# Patient Record
Sex: Male | Born: 1950 | Race: Black or African American | Hispanic: No | Smoking: Never smoker
Health system: Southern US, Community
[De-identification: ages and names within clinical notes are randomized; demographics above are authoritative.]

## PROBLEM LIST (undated history)

## (undated) DIAGNOSIS — M1990 Unspecified osteoarthritis, unspecified site: Secondary | ICD-10-CM

## (undated) DIAGNOSIS — E7800 Pure hypercholesterolemia, unspecified: Secondary | ICD-10-CM

## (undated) DIAGNOSIS — Z9989 Dependence on other enabling machines and devices: Secondary | ICD-10-CM

## (undated) DIAGNOSIS — I219 Acute myocardial infarction, unspecified: Secondary | ICD-10-CM

## (undated) DIAGNOSIS — Z9289 Personal history of other medical treatment: Secondary | ICD-10-CM

## (undated) DIAGNOSIS — G8929 Other chronic pain: Secondary | ICD-10-CM

## (undated) DIAGNOSIS — E039 Hypothyroidism, unspecified: Secondary | ICD-10-CM

## (undated) DIAGNOSIS — I4892 Unspecified atrial flutter: Secondary | ICD-10-CM

## (undated) DIAGNOSIS — N183 Chronic kidney disease, stage 3 unspecified: Secondary | ICD-10-CM

## (undated) DIAGNOSIS — R55 Syncope and collapse: Secondary | ICD-10-CM

## (undated) DIAGNOSIS — M545 Low back pain, unspecified: Secondary | ICD-10-CM

## (undated) DIAGNOSIS — K922 Gastrointestinal hemorrhage, unspecified: Secondary | ICD-10-CM

## (undated) DIAGNOSIS — Z95810 Presence of automatic (implantable) cardiac defibrillator: Secondary | ICD-10-CM

## (undated) DIAGNOSIS — Z950 Presence of cardiac pacemaker: Secondary | ICD-10-CM

## (undated) DIAGNOSIS — I2510 Atherosclerotic heart disease of native coronary artery without angina pectoris: Secondary | ICD-10-CM

## (undated) DIAGNOSIS — D638 Anemia in other chronic diseases classified elsewhere: Secondary | ICD-10-CM

## (undated) DIAGNOSIS — G4733 Obstructive sleep apnea (adult) (pediatric): Secondary | ICD-10-CM

## (undated) DIAGNOSIS — T4145XA Adverse effect of unspecified anesthetic, initial encounter: Secondary | ICD-10-CM

## (undated) DIAGNOSIS — E119 Type 2 diabetes mellitus without complications: Secondary | ICD-10-CM

## (undated) DIAGNOSIS — T8859XA Other complications of anesthesia, initial encounter: Secondary | ICD-10-CM

## (undated) DIAGNOSIS — B1920 Unspecified viral hepatitis C without hepatic coma: Secondary | ICD-10-CM

## (undated) HISTORY — DX: Anemia in other chronic diseases classified elsewhere: D63.8

## (undated) HISTORY — PX: LAPAROSCOPIC CHOLECYSTECTOMY: SUR755

## (undated) HISTORY — PX: ATRIAL FLUTTER ABLATION: SHX5733

## (undated) HISTORY — PX: CARDIAC DEFIBRILLATOR PLACEMENT: SHX171

## (undated) HISTORY — PX: CORONARY ANGIOPLASTY WITH STENT PLACEMENT: SHX49

## (undated) HISTORY — PX: CARDIOVERSION: SHX1299

## (undated) HISTORY — PX: ANKLE FRACTURE SURGERY: SHX122

## (undated) HISTORY — PX: FRACTURE SURGERY: SHX138

---

## 2006-06-10 HISTORY — PX: CARDIAC CATHETERIZATION: SHX172

## 2006-07-09 ENCOUNTER — Inpatient Hospital Stay (HOSPITAL_COMMUNITY): Admission: EM | Admit: 2006-07-09 | Discharge: 2006-07-18 | Payer: Self-pay | Admitting: Emergency Medicine

## 2006-07-09 ENCOUNTER — Ambulatory Visit: Payer: Self-pay | Admitting: Internal Medicine

## 2007-06-26 IMAGING — US US ABDOMEN COMPLETE
1 series · 14 of 25 positions shown · non-contrast
Comparison: No comparison films available.

CLINICAL DATA: Abdominal pain.  
 ABDOMEN ULTRASOUND:
TECHNIQUE: Complete abdominal ultrasound examination was performed including evaluation of the liver, gallbladder, bile ducts, pancreas, kidneys, spleen, IVC, and abdominal aorta.

[Series 1: unknown · 0.35mm/px · 14 of 88 slices shown]
[im 1/88]
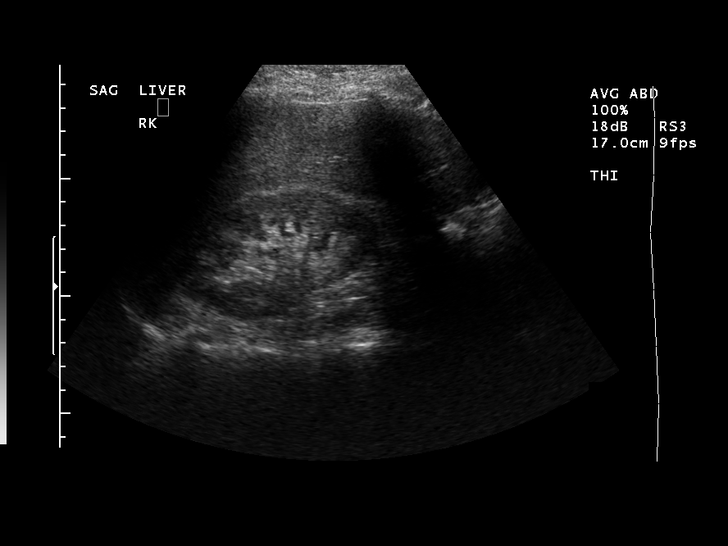
[im 8/88]
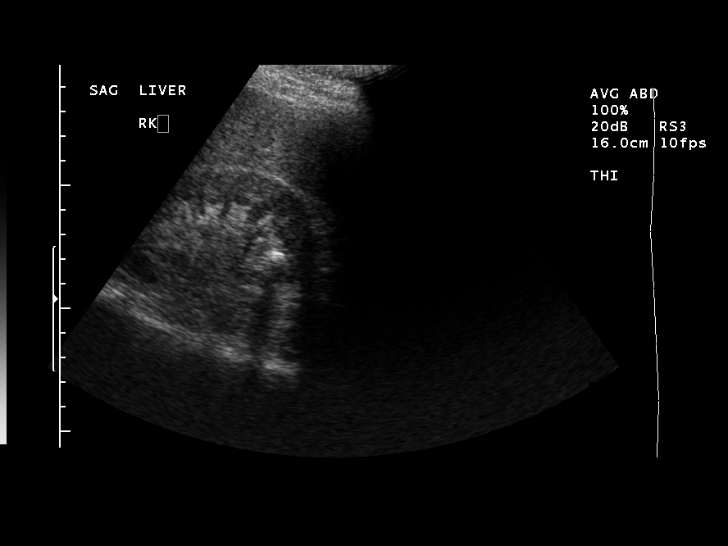
[im 15/88]
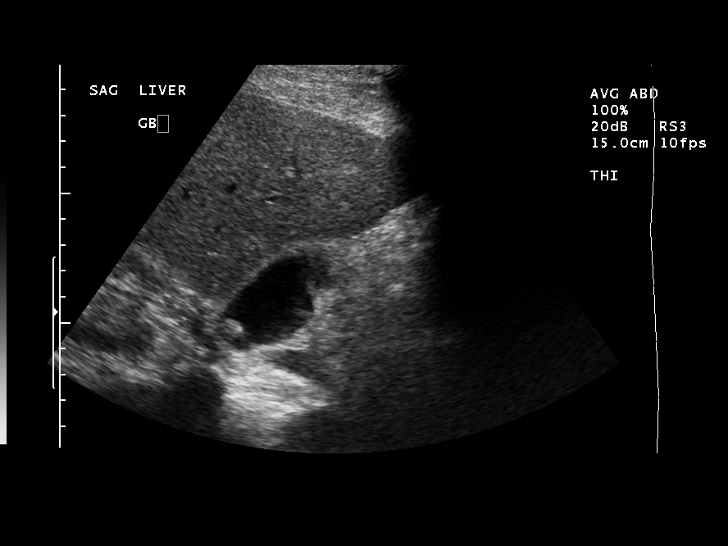
[im 22/88]
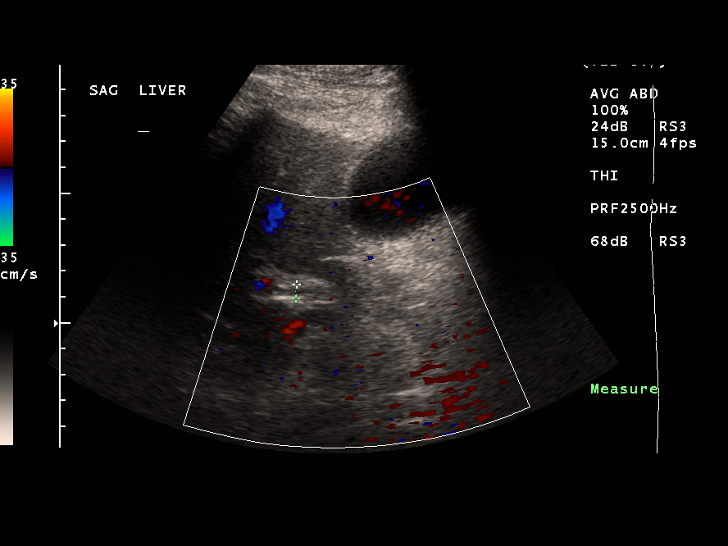
[im 30/88]
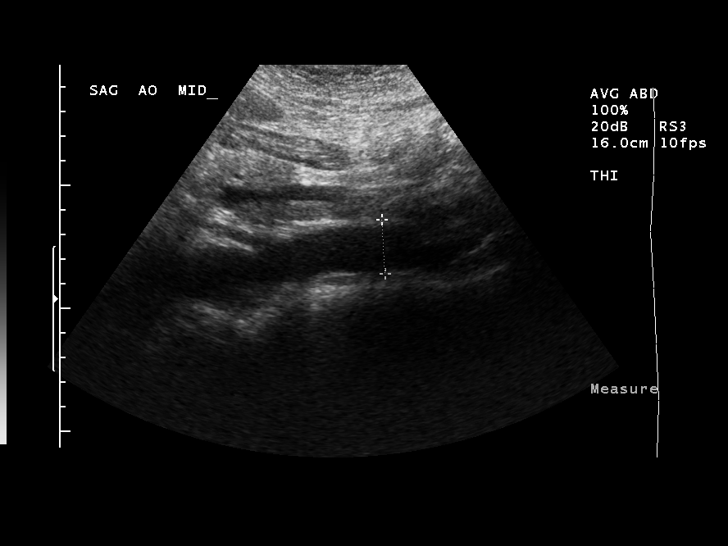
[im 33/88]
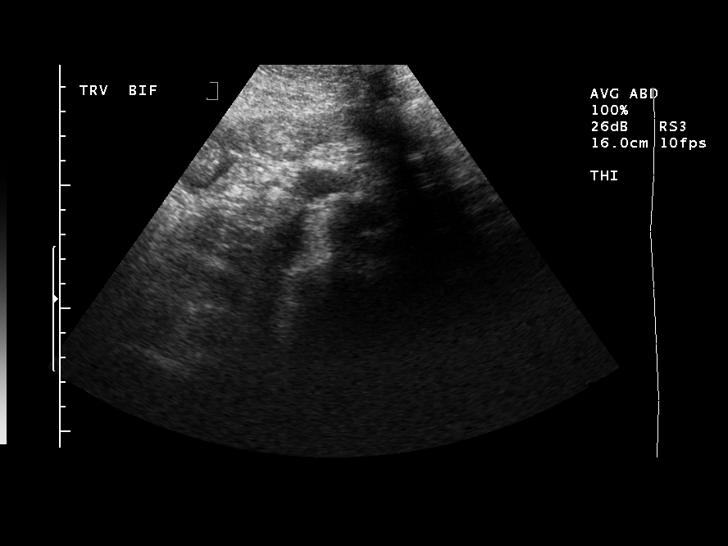
[im 40/88]
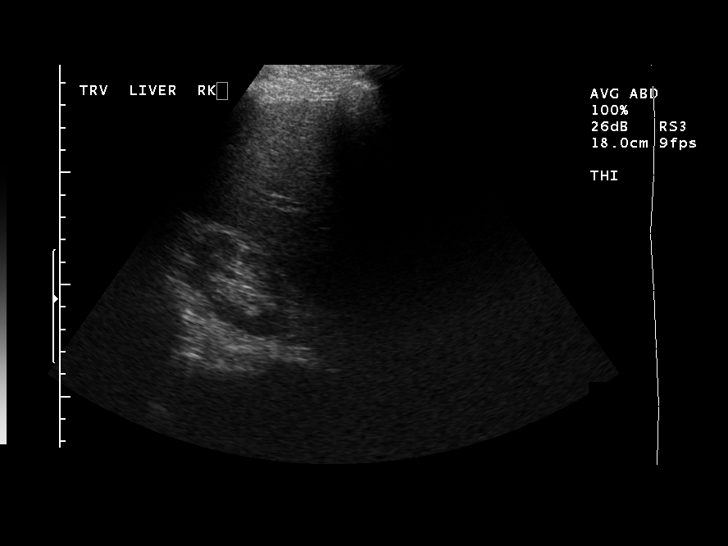
[im 48/88]
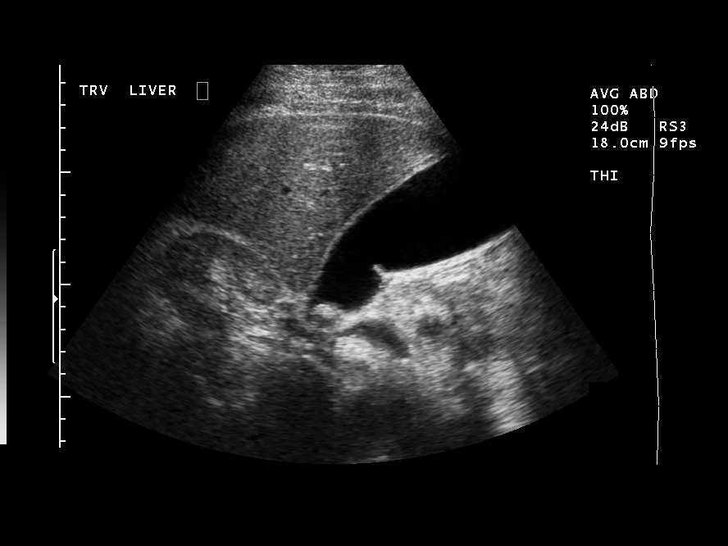
[im 55/88]
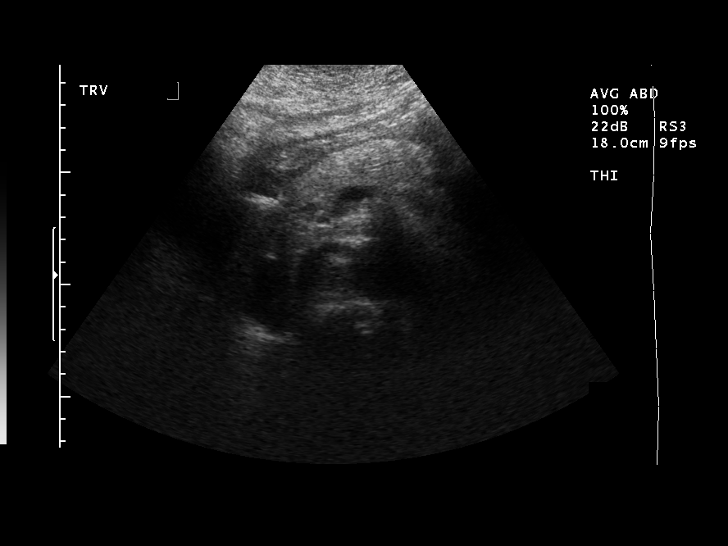
[im 59/88]
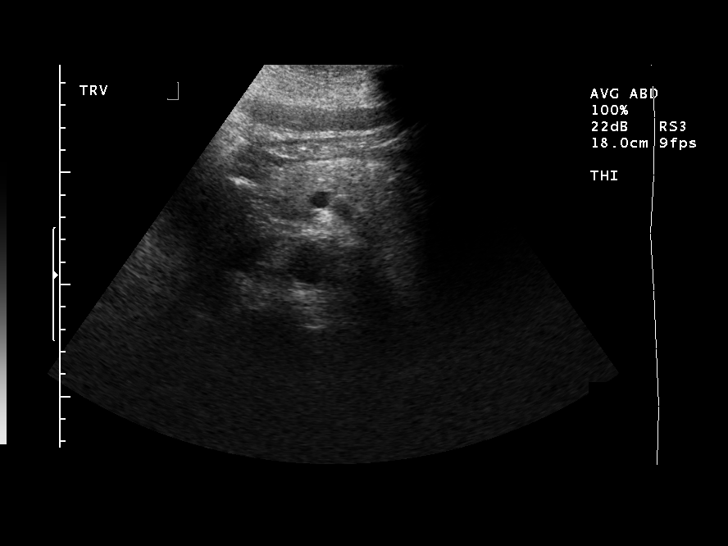
[im 66/88]
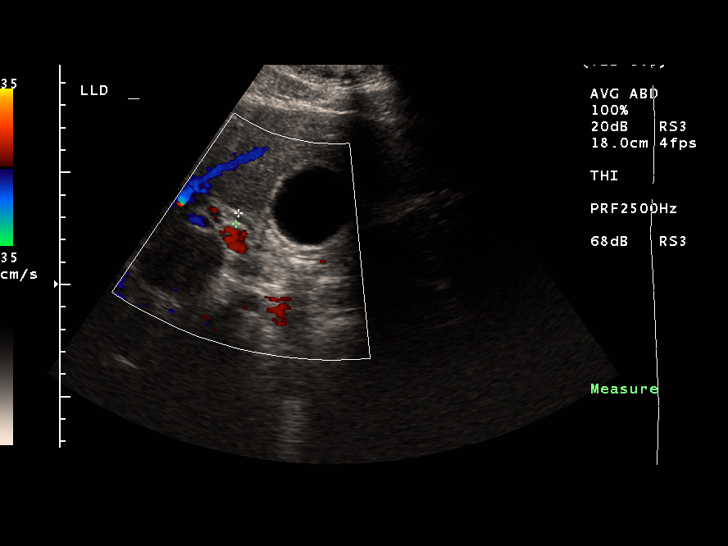
[im 73/88]
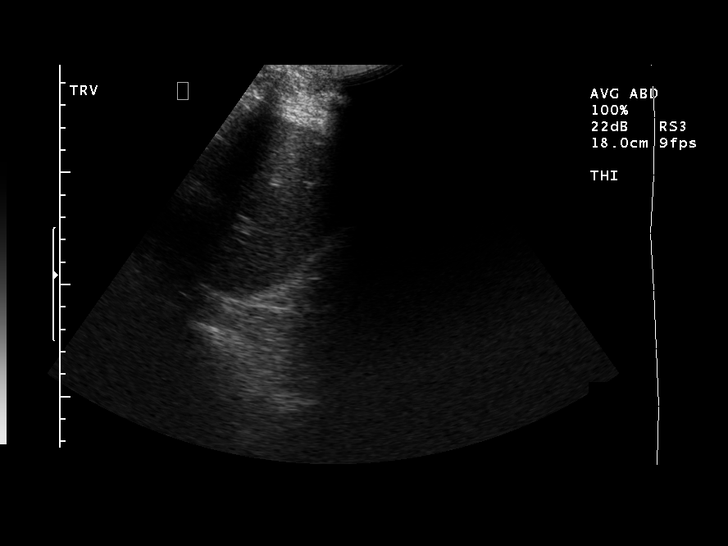
[im 80/88]
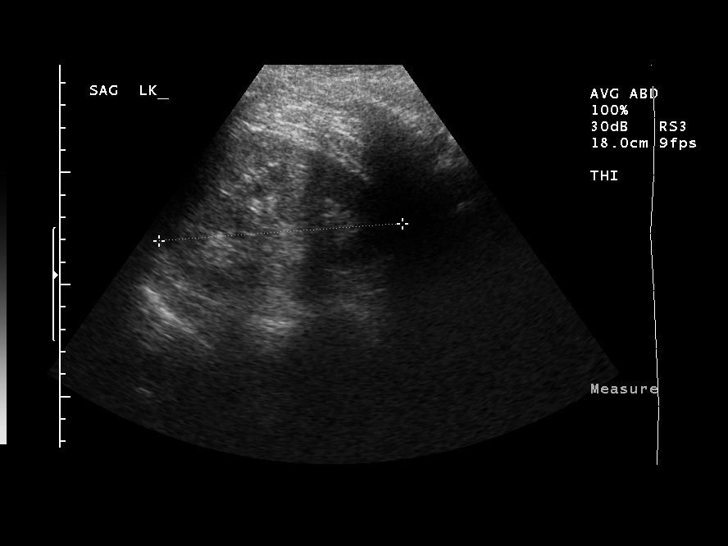
[im 88/88]
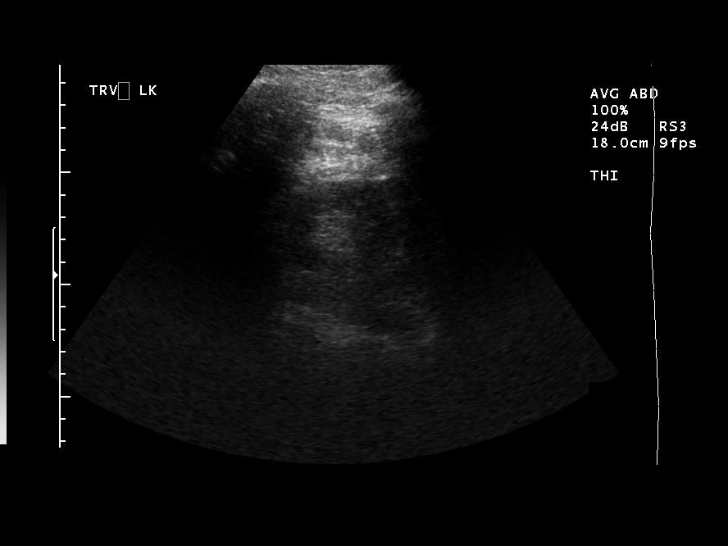

[14 of 25 positions shown; findings below may reference images not displayed]

FINDINGS: There is a stone within the gallbladder neck.  No evidence of gallbladder wall thickening, pericholecystic fluid or sonographic Murphy?s sign.  There is no evidence of biliary dilatation and the common bile duct measures 5.4 mm in greatest diameter.  The liver, IVC, pancreas, spleen, left kidney and abdominal aorta are unremarkable.  Non-obstructing calculus within the lower right kidney is noted.  No evidence of free fluid.
IMPRESSION: 1.  Cholelithiasis in the region of the gallbladder neck without evidence of cholecystitis.  
 2.  No biliary ductal dilatation.  
 3.  Non-obstructing right lower pole renal calculus.

## 2007-06-27 IMAGING — CR DG CHEST 1V PORT
1 series · 1 of 1 positions shown · non-contrast
Comparison: 07/09/06 at [DATE] a.m.

CLINICAL DATA: Pre-cardiac catheterization. Biliary colic.
 PORTABLE CHEST - 1 VIEW ? 07/10/06 AT [DATE] A.M.:

[view not recorded]
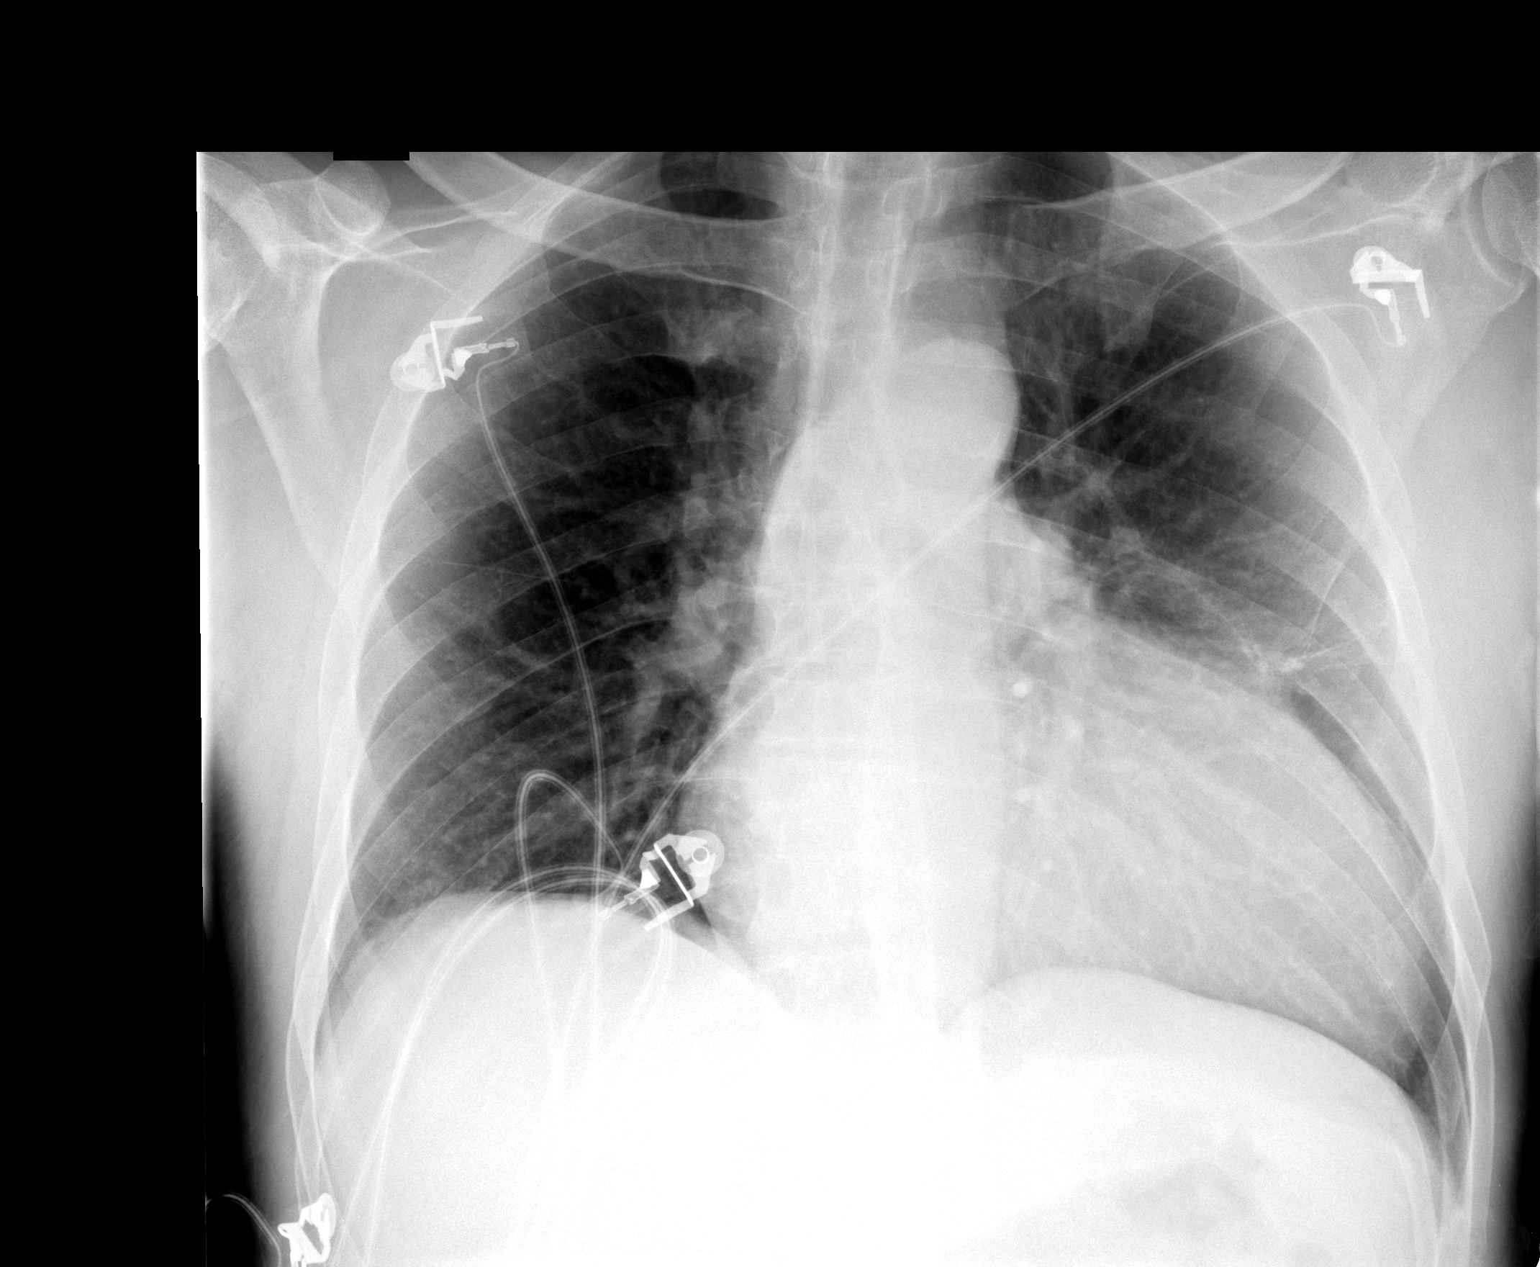

[1 of 1 positions shown; findings below may reference images not displayed]

FINDINGS: Linear opacity left hilar region extending towards the peripheral aspect of the left mid lung may represent scarring; however, comparison with remote exam to help confirm stability is recommended. If remote exams are not available, this can be further evaluated presently with CT imaging or stability can be confirmed on chest x-ray surveillance. 
 Cardiomegaly with mild central pulmonary vascular prominence.  No segmental infiltrate.
IMPRESSION: 1.  Cardiomegaly without infiltrate or congestive heart failure. Mild central pulmonary vascular prominence. 
 2.  Question scarring left mid to upper lung zone as noted above.

## 2007-06-28 IMAGING — RF DG CHOLANGIOGRAM OPERATIVE
1 series · 4 of 4 positions shown · non-contrast
Comparison: Abdominal ultrasound 07/09/06.

CLINICAL DATA: Laparoscopic cholecystectomy.
OPERATIVE CHOLANGIOGRAM ? 07/11/06:
TECHNIQUE: Multiple fluoroscopic radiographs were obtained during intraoperative cholangiogram and are submitted for interpretation post-operatively.

[Series 1: run · 4 of 29 frames shown]
[frame 5/29]
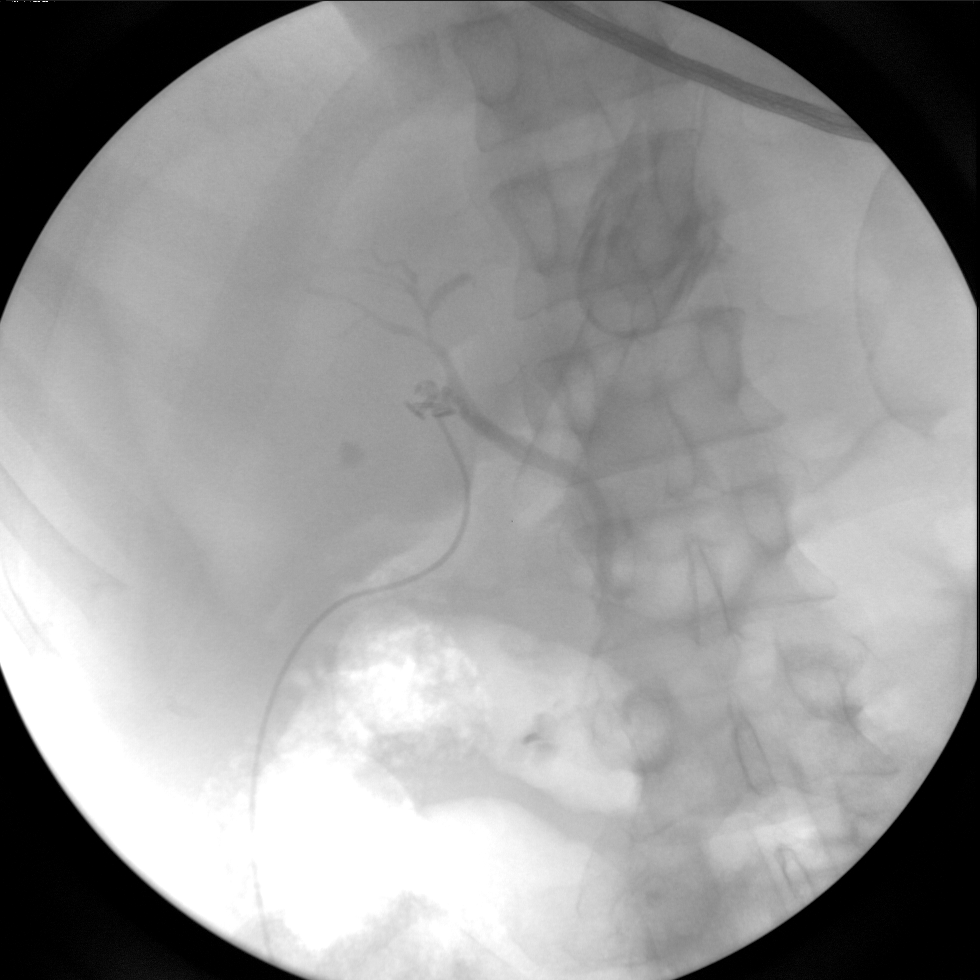
[frame 15/29]
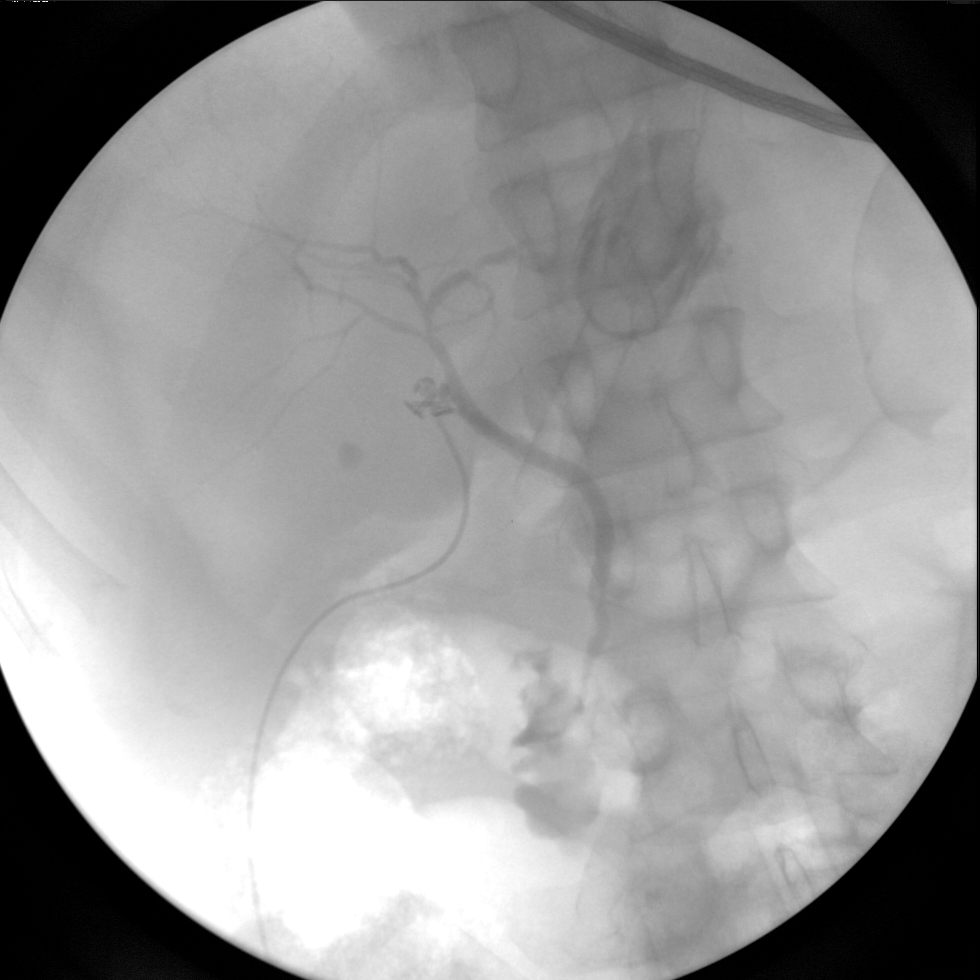
[frame 25/29]
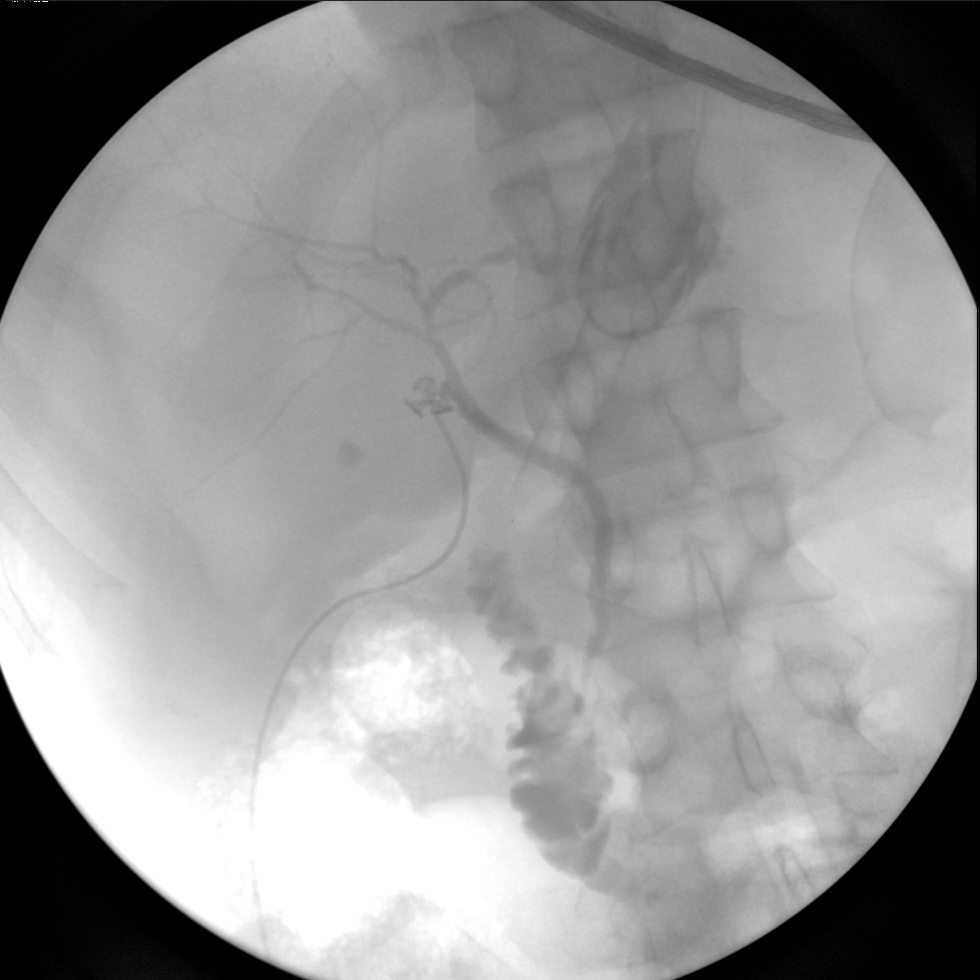
[frame 27/29]
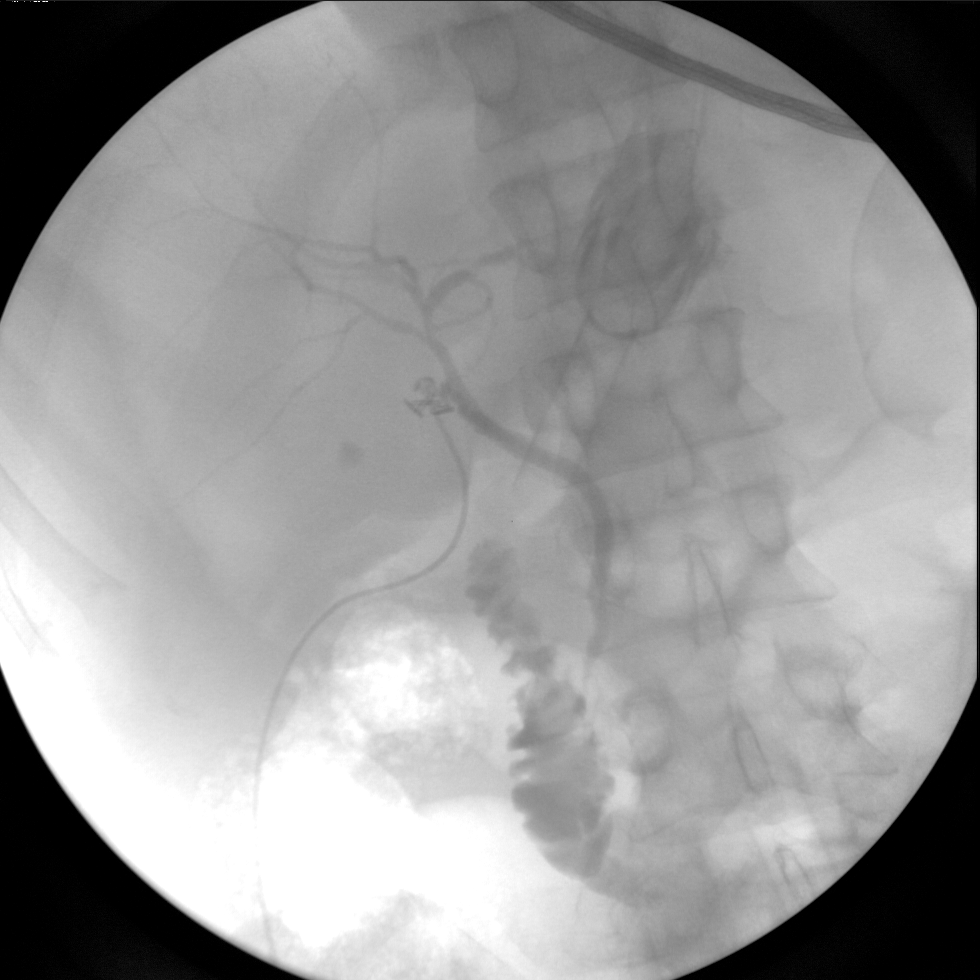

[4 of 4 positions shown; findings below may reference images not displayed]

FINDINGS: Injection of the cystic duct remnant demonstrates a normal caliber common bile duct and drainage into the duodenum.  No retained calculi are demonstrated.  A rounded density lying lateral to the injection site does not change and may reflect an abdominal calcification or contrast dripped from the injection tubing.  This does not appear to reflect extravasation - correlate clinically.
IMPRESSION: Negative for ductal obstruction or retained calculi.

## 2010-10-15 ENCOUNTER — Inpatient Hospital Stay (HOSPITAL_COMMUNITY)
Admission: EM | Admit: 2010-10-15 | Discharge: 2010-10-17 | DRG: 313 | Disposition: A | Discharge status: Home / Self Care | Payer: Non-veteran care | Attending: Cardiology | Admitting: Cardiology

## 2010-10-15 ENCOUNTER — Emergency Department (HOSPITAL_COMMUNITY): Payer: Non-veteran care

## 2010-10-15 DIAGNOSIS — I472 Ventricular tachycardia, unspecified: Secondary | ICD-10-CM | POA: Diagnosis present

## 2010-10-15 DIAGNOSIS — I10 Essential (primary) hypertension: Secondary | ICD-10-CM | POA: Diagnosis present

## 2010-10-15 DIAGNOSIS — I2510 Atherosclerotic heart disease of native coronary artery without angina pectoris: Secondary | ICD-10-CM | POA: Diagnosis present

## 2010-10-15 DIAGNOSIS — Z95810 Presence of automatic (implantable) cardiac defibrillator: Secondary | ICD-10-CM

## 2010-10-15 DIAGNOSIS — I517 Cardiomegaly: Secondary | ICD-10-CM | POA: Diagnosis present

## 2010-10-15 DIAGNOSIS — E119 Type 2 diabetes mellitus without complications: Secondary | ICD-10-CM | POA: Diagnosis present

## 2010-10-15 DIAGNOSIS — R079 Chest pain, unspecified: Secondary | ICD-10-CM

## 2010-10-15 DIAGNOSIS — Z7982 Long term (current) use of aspirin: Secondary | ICD-10-CM

## 2010-10-15 DIAGNOSIS — I440 Atrioventricular block, first degree: Secondary | ICD-10-CM | POA: Diagnosis present

## 2010-10-15 DIAGNOSIS — I422 Other hypertrophic cardiomyopathy: Secondary | ICD-10-CM | POA: Diagnosis present

## 2010-10-15 DIAGNOSIS — E876 Hypokalemia: Secondary | ICD-10-CM | POA: Diagnosis present

## 2010-10-15 DIAGNOSIS — I4729 Other ventricular tachycardia: Secondary | ICD-10-CM | POA: Diagnosis present

## 2010-10-15 DIAGNOSIS — R0789 Other chest pain: Principal | ICD-10-CM | POA: Diagnosis present

## 2010-10-15 DIAGNOSIS — R9431 Abnormal electrocardiogram [ECG] [EKG]: Secondary | ICD-10-CM | POA: Diagnosis present

## 2010-10-15 LAB — DIFFERENTIAL
Eosinophils Absolute: 0 10*3/uL (ref 0.0–0.7)
Eosinophils Relative: 0 % (ref 0–5)

## 2010-10-15 LAB — BASIC METABOLIC PANEL
CO2: 28 mEq/L (ref 19–32)
Calcium: 9.1 mg/dL (ref 8.4–10.5)
Chloride: 101 mEq/L (ref 96–112)
GFR calc Af Amer: 57 mL/min — ABNORMAL LOW (ref >60)
GFR calc non Af Amer: 47 mL/min — ABNORMAL LOW (ref >60)
Glucose, Bld: 159 mg/dL — ABNORMAL HIGH (ref 70–99)
Potassium: 3 mEq/L — ABNORMAL LOW (ref 3.5–5.1)
Sodium: 138 mEq/L (ref 135–145)

## 2010-10-15 LAB — CARDIAC PANEL(CRET KIN+CKTOT+MB+TROPI)
Relative Index: 3.1 — ABNORMAL HIGH (ref 0.0–2.5)
Total CK: 162 U/L (ref 7–232)
Troponin I: <0.3 ng/mL (ref <0.30)

## 2010-10-15 LAB — CBC
RDW: 13.9 % (ref 11.5–15.5)
WBC: 5.3 10*3/uL (ref 4.0–10.5)

## 2010-10-15 LAB — POCT CARDIAC MARKERS
Myoglobin, poc: 83.4 ng/mL (ref 12–200)
Troponin i, poc: 0.07 ng/mL (ref 0.00–0.09)

## 2010-10-15 LAB — MAGNESIUM: Magnesium: 1.7 mg/dL (ref 1.5–2.5)

## 2010-10-15 LAB — GLUCOSE, CAPILLARY: Glucose-Capillary: 213 mg/dL — ABNORMAL HIGH (ref 70–99)

## 2010-10-15 LAB — D-DIMER, QUANTITATIVE: D-Dimer, Quant: <0.22 ug/mL-FEU (ref 0.00–0.48)

## 2010-10-16 DIAGNOSIS — I059 Rheumatic mitral valve disease, unspecified: Secondary | ICD-10-CM

## 2010-10-16 LAB — GLUCOSE, CAPILLARY
Glucose-Capillary: 175 mg/dL — ABNORMAL HIGH (ref 70–99)
Glucose-Capillary: 131 mg/dL — ABNORMAL HIGH (ref 70–99)
Glucose-Capillary: 128 mg/dL — ABNORMAL HIGH (ref 70–99)

## 2010-10-16 LAB — BASIC METABOLIC PANEL
GFR calc Af Amer: 59 mL/min — ABNORMAL LOW (ref >60)
GFR calc non Af Amer: 48 mL/min — ABNORMAL LOW (ref >60)
Sodium: 141 mEq/L (ref 135–145)

## 2010-10-16 LAB — TSH: TSH: 5.983 u[IU]/mL — ABNORMAL HIGH (ref 0.350–4.500)

## 2010-10-16 LAB — CARDIAC PANEL(CRET KIN+CKTOT+MB+TROPI): Relative Index: 3 — ABNORMAL HIGH (ref 0.0–2.5)

## 2010-10-17 LAB — BASIC METABOLIC PANEL
CO2: 29 mEq/L (ref 19–32)
Calcium: 8.8 mg/dL (ref 8.4–10.5)
Chloride: 99 mEq/L (ref 96–112)
GFR calc Af Amer: >60 mL/min (ref >60)
Potassium: 3.8 mEq/L (ref 3.5–5.1)
Sodium: 135 mEq/L (ref 135–145)

## 2010-10-17 LAB — GLUCOSE, CAPILLARY

## 2010-10-25 NOTE — H&P (Signed)
NAME:  Anthony, Mcfarland NO.:  [id number]  MEDICAL RECORD NO.:  [id number]           PATIENT TYPE:  I  LOCATION:  2006                         FACILITY:  [facility]  PHYSICIAN:  Marca Ancona, MD      DATE OF BIRTH:  Nov 14, 1950  DATE OF ADMISSION:  10/15/2010 DATE OF DISCHARGE:                             HISTORY & PHYSICAL   ELECTROPHYSIOLOGIST:  Duke Salvia, MD, [facility]  PRIMARY CARDIOLOGIST:  Followed at the [facility].  PRIMARY CARE PHYSICIAN:  Followed at the [facility].  CHIEF COMPLAINT:  Chest pain.  HISTORY OF PRESENT ILLNESS:  Anthony Mcfarland is a [name] formally followed by Anthony Mcfarland of [facility] and seen by Anthony Mcfarland in consultation for question of ICD implantation given history of hypertrophic cardiomyopathy with dynamic outflow obstruction, but normal LV function and nonsustained ventricular tachycardia in February 2008.  The patient has subsequently followed up with the [facility] for all his cardiology issues and has had a Medtronic ICD placed since then, although exact date is unknown.  The patient had been doing well until 2 days ago when he started experiencing chest discomfort that has been constant since and occurred after carrying some heavy crates of sodas, although without obvious injury during that activity.  The patient reports that his chest discomfort is somewhat better at this time.  The patient carried some heavy creates of sodas 2 days ago and noted afterwards he has had left-sided low chest discomfort ever since.  There is no exertional component that there does seem to be some pleuritic component to his chest discomfort.  He does report mild chronic dyspnea on exertion that has not significantly worsened lately.  His chest discomfort does not radiate.  He has had no nausea, vomiting, fevers, chills, orthopnea, PND, lower extremity  edema, presyncope, palpitations, bleeding, or any other recent changes.  His symptoms worsened today and he decided since they were not getting better, in fact it had gotten worse, he decided to present to [facility] ED for further eval.  In the emergency apartment, EKG shows sinus bradycardia with a profound first-degree AV block as well as prolonged QTc, rate in the 50s and stable.  Vital signs within normal limits and stable except for mild hypertension with systolic in the 150.  Chest x-ray shows stable cardiomegaly with postoperative changes.  Labs significant for hypokalemia at 3.0 and point-of-care markers negative as well as creatinine mildly elevated at 1.52.  PAST MEDICAL HISTORY: 1. Nonobstructive CAD per cath on July 10, 2006, (large first     diagonal branch in the ramus distribution did have 70% ostial     stenosis, otherwise 40% to 50% stenosis at most). 2. Hypertrophic cardiomyopathy with septum greater than 30 and some     dynamic outflow obstruction per echo in January 2008. 3. Nonsustained ventricular tachycardia (on amiodarone). 4. Hypertension. 5. History of syncope. 6. Non-insulin-dependent diabetes mellitus. 7. History of lung abscess approximately 10 years ago.     a.     SP partial lobectomy. 8. SP cholelithiasis.     a.  Laparoscopic cholecystectomy in February 2008.  SOCIAL HISTORY:  The patient lives in [place] with his family.  He is a nonsmoker.  No significant EtOH, no illicit drug use, regular diet. No regular exercise but is active in daily life without exertional symptoms.  FAMILY HISTORY:  No premature diagnosis of coronary disease but does have one cousin who died of sudden cardiac death at age 34.  REVIEW OF SYSTEMS:  Please see HPI.  All other systems were reviewed were negative.  CODE STATUS:  Full.  ALLERGIES:  NKDA.  MEDICATIONS: 1. Acetaminophen 650 mg p.o. p.r.n. 2. Amiodarone 200 mg 2 tablets p.o. daily. 3. Amlodipine 10 mg  p.o. daily. 4. Colace 100 mg p.o. b.i.d. 5. Glipizide 5 mg p.o. daily. 6. Labetalol 100 mg p.o. b.i.d. 7. Synthroid 0.05 mg p.o. daily. 8. Lisinopril 40 mg p.o. daily. 9. Sudanophil 25 mg p.r.n. 10.Simvastatin 10 mg p.o. daily. 11.Aspirin 81 mg p.o. daily.  PHYSICAL EXAMINATION:  VITAL SIGNS:  Temperature 97.7 degrees Fahrenheit, BP 120s-150s over 80s, pulse in the 50s, respiration rate in the 10s, O2 saturation 100% on 2 L by nasal cannula. GENERAL:  The patient is alert and oriented x3 in no apparent distress, although he does appear lethargic but no respiratory distress with head of bed flat including with full sentences and minimal movement during exam. HEENT:  Head is normocephalic, atraumatic.  Pupils equal, round, reactive light.  Extraocular muscles are intact.  Nares are patent without discharge.  Oropharynx without erythema or exudates. NECK:  Supple without lymphadenopathy.  No thyromegaly.  No JVD. HEART:  Rate is regular with audible S1 and S2, brady in the 50s, 2/6 systolic murmur at the right upper sternal border and apex.  Note, pulses 2+ and equal in both upper and lower extremities bilaterally. LUNGS:  Clear to auscultation bilaterally. SKIN:  No rashes, lesions, or petechiae. ABDOMEN:  Soft, nontender, nondistended.  Normal abdominal bowel sounds. No rebound or guarding.  No hepatosplenomegaly. EXTREMITIES:  Without clubbing, cyanosis, or edema. MUSCULOSKELETAL:  Without joint deformities or effusions.  No spinal or CVA tenderness. NEUROLOGIC:  Cranial nerves II through XII grossly intact.  Strength 5/5 in all extremities and axillary groups.  Normal sensation throughout and normal cerebellar function.  RADIOLOGY:  Chest x-ray shows stable cardiomegaly, status post postoperative changes.  EKG:  Sinus bradycardia at 58 with profound first-degree AV block with PR of 380 as well as QT prolongation with OT of 636 and QTc of 579, otherwise nonspecific ST-T-wave  changes with Q-waves in lead I with normal axis, likely left ventricular hypertrophy, and QRS of 120.  LABORATORY DATA:  WBC is 5.3, HGB 13.3, HCT 38.5, PLT count is 141. Sodium 138, potassium 3.0, chloride 101, bicarb 28, BUN 19, creatinine 1.52, glucose 159.  Point-of-care markers negative x1.  ASSESSMENT AND PLAN:  The patient seen by both Jarrett Ables, PA-C, and attending cardiologist, Dr. Marca Ancona.  Mr. Molina is a [name] with a known history of hypertrophic cardiomyopathy with some dynamic outflow obstruction, nonobstructive CAD, who was followed at [facility] who presented with chest discomfort, constant over the last 2 days.  The patient reports chest discomfort over the past 3 days, primarily pleuritic, first noted after carrying some heavy objects.  EKG shows first-degree AV block with long syncope.  The patient has very tender intercostal muscle on the left side below the midline under nipple consistent with chest wall pain, question pulled muscle.  The patient is low risk  for pulmonary embolism.  First set of cardiac enzymes negative.  Chest pain - pleuritic, tender chest wall multiple, most likely musculoskeletal.  We will check a D-dimer and VQ scan if positive given elevated creatinine.  We will cycle cardiac enzymes, continue home medications including aspirin.  Chest x-ray shows findings consistent with his known history of lung abscess, SP resection.  HCM - murmur consistent with LVOT obstruction.  We will check a 2-D echocardiogram and continue home medications including labetalol.  We will give 250 mL normal saline bolus.  Long QTc - the patient is on amiodarone (question secondary to NSVT), low potassium.  We questioned why potassium is low, so we will replete, check again tomorrow morning after two doses of 40 mEq of KCL and decrease amio to 200 mg p.o. daily.  We will recheck EKG in the morning. We  will attempt to get records from the [facility].     Jarrett Ables, PAC   ______________________________ Marca Ancona, MD    MS/MEDQ  D:  10/15/2010  T:  10/16/2010  Job:  [id number]  Electronically Signed by Jarrett Ables PAC on 10/17/2010 01:44:50 PM Electronically Signed by Marca Ancona MD on 10/25/2010 11:48:41 PM

## 2010-10-29 NOTE — Discharge Summary (Addendum)
  NAME:  Anthony Mcfarland, Anthony Mcfarland NO.:  [id number]  MEDICAL RECORD NO.:  [id number]           PATIENT TYPE:  I  LOCATION:  2006                         FACILITY:  [facility]  PHYSICIAN:  Hillis Range, MD       DATE OF BIRTH:  [birth year]  DATE OF ADMISSION:  10/15/2010 DATE OF DISCHARGE:  10/17/2010                              DISCHARGE SUMMARY   ADDENDUM  Please note the patient was not discharged until Oct 17, 2010.  He continued to have some flank/axillary pain, tender to palpation, and his chest pain was felt to be atypical, with a negative D-dimer.  Dr. Johney Frame suggested supportive care for likely musculoskeletal pain including a Percocet p.r.n. with no morphine.  His amiodarone was decreased to 200 mg daily, and his medication reconciliation has been updated to reflect that.  His potassium was rechecked as it was low earlier on his admission, and was 3.8.  Dr. Johney Frame has seen and examined him today and feels he is stable for discharge.  Of note, his TSH was also mildly elevated at 5.983, and therefore he will be instructed to follow up with his PCP regarding possible dose adjustment.     Ronie Spies, P.A.C.   ______________________________ Hillis Range, MD    DD/MEDQ  D:  10/17/2010  T:  10/18/2010  Job:  [id number]  cc:   [facility] Duke Salvia, MD, [facility]  Electronically Signed by Ronie Spies  on 10/29/2010 01:35:39 PM Electronically Signed by Hillis Range MD on 11/02/2010 05:36:58 PM

## 2010-10-30 NOTE — Discharge Summary (Signed)
NAME:  Anthony Mcfarland, CANTARA NO.:  [id number]  MEDICAL RECORD NO.:  [id number]           PATIENT TYPE:  I  LOCATION:  2006                         FACILITY:  [facility]  PHYSICIAN:  Jesse Sans. Floye Fesler, MD, FACCDATE OF BIRTH:  1950/12/08  DATE OF ADMISSION:  10/15/2010 DATE OF DISCHARGE:  10/16/2010                              DISCHARGE SUMMARY   PRIMARY CARDIOLOGIST:  [facility].  PRIMARY CARE:  [facility].  DISCHARGE DIAGNOSIS:  Chest pain without objective evidence of ischemia.  SECONDARY DIAGNOSES: 1. History of nonobstructive coronary artery disease by     catheterization in January 2008. 2. History of hypertrophic cardiomyopathy with echocardiogram     performed this admission. 3. History of nonsustained ventricular tachycardia, on amiodarone     therapy. 4. Hypertension. 5. History of syncope. 6. Non-insulin-dependent diabetes mellitus. 7. History of lung abscess 10 years ago. 8. Status post partial lobectomy. 9. Status post cholelithiasis. 10.Status post cholecystectomy. 11.Status post Medtronic ICD placement.  ALLERGIES:  No known drug allergies.  PROCEDURES:  2-D echocardiogram performed on Oct 16, 2010, revealing an EF of 65% to 70% in a normal LV filling pattern with concomitant abnormal relaxation, increased filling pressure (grade 2 diastolic dysfunction).  Mild mitral regurgitation.  Mildly dilated left atrium and right atrium.  HISTORY OF PRESENT ILLNESS:  A [name] with the above problem list who was in his usual state of health until after 2 days prior to admission when he was lifting some heavy thing suddenly developed left-sided chest discomfort, resolved with rest.  On the day of admission, the patient had recurrent symptoms and presented to the ED for evaluation.  In the ED, ECG showed no acute changes, we did note widened QTC and profound first-degree AV block.  He is admitted for further  evaluation.  HOSPITAL COURSE:  The patient ruled out for MI.  He has continued to have somewhat constant pleuritic positional chest discomfort.  His D- dimer has been negative.  Echocardiogram was performed earlier today showing EF of 65% to 70% with grade 2 diastolic dysfunction and severe concentric hypertrophy in accordance with prior history of hypertrophic cardiomyopathy.  The patient will be discharged home today with plan for followup with [facility] as previously scheduled.  DISCHARGE LABS:  Hemoglobin 13.3, hematocrit 38.5, WBC 5.3, platelets 141.  Sodium 141, potassium 3.4, chloride 105, CO2 of 29, BUN 15, creatinine 1.48, glucose 102, calcium 8.6, magnesium 1.7, CK 129, MB 4.5, troponin-I less than 0.30.  TSH 5.983.  DISPOSITION:  The patient will be discharged home today in good condition.  FOLLOWUP PLANS AND APPOINTMENTS:  The patient will follow up at [facility] in the next 1-2 weeks.  DISCHARGE MEDICATIONS: 1. Lisinopril 40 mg daily. 2. Amiodarone 200 mg two tabs daily. 3. Amlodipine 10 mg daily. 4. Aspirin 81 mg daily. 5. Docusate 1 mg b.i.d. 6. Glipizide 5 mg daily. 7. Labetalol 100 mg b.i.d. 8. Levothyroxine 50 mcg daily. 9. Benadryl 25 mg p.r.n. 10.Simvastatin 10 mg at bedtime. 11.Tylenol 325 mg 2 tabs q.6 h. p.r.n.  OUTSTANDING LABORATORY DATA AND STUDIES:  None.  Duration of discharge encounter 45 minutes including physician time.     Anthony Mcfarland, ANP   ______________________________ Jesse Sans. Daleen Squibb, MD, [facility]    CB/MEDQ  D:  10/16/2010  T:  10/17/2010  Job:  [id number]  Electronically Signed by Anthony Mcfarland ANP on 10/24/2010 01:12:33 PM Electronically Signed by Anthony Castle MD FACC on 10/30/2010 07:59:21 AM

## 2012-11-28 ENCOUNTER — Emergency Department (HOSPITAL_COMMUNITY)
Admission: EM | Admit: 2012-11-28 | Discharge: 2012-11-28 | Disposition: A | Discharge status: Home / Self Care | Payer: No Typology Code available for payment source | Attending: Emergency Medicine | Admitting: Emergency Medicine

## 2012-11-28 ENCOUNTER — Encounter (HOSPITAL_COMMUNITY): Payer: Self-pay | Admitting: Adult Health

## 2012-11-28 ENCOUNTER — Emergency Department (HOSPITAL_COMMUNITY): Payer: No Typology Code available for payment source

## 2012-11-28 DIAGNOSIS — Y9389 Activity, other specified: Secondary | ICD-10-CM | POA: Insufficient documentation

## 2012-11-28 DIAGNOSIS — S199XXA Unspecified injury of neck, initial encounter: Secondary | ICD-10-CM | POA: Diagnosis not present

## 2012-11-28 DIAGNOSIS — S4980XA Other specified injuries of shoulder and upper arm, unspecified arm, initial encounter: Secondary | ICD-10-CM | POA: Diagnosis not present

## 2012-11-28 DIAGNOSIS — E119 Type 2 diabetes mellitus without complications: Secondary | ICD-10-CM | POA: Diagnosis not present

## 2012-11-28 DIAGNOSIS — S0993XA Unspecified injury of face, initial encounter: Secondary | ICD-10-CM | POA: Insufficient documentation

## 2012-11-28 DIAGNOSIS — I2510 Atherosclerotic heart disease of native coronary artery without angina pectoris: Secondary | ICD-10-CM | POA: Insufficient documentation

## 2012-11-28 DIAGNOSIS — Z95810 Presence of automatic (implantable) cardiac defibrillator: Secondary | ICD-10-CM | POA: Insufficient documentation

## 2012-11-28 DIAGNOSIS — M25512 Pain in left shoulder: Secondary | ICD-10-CM

## 2012-11-28 DIAGNOSIS — S46909A Unspecified injury of unspecified muscle, fascia and tendon at shoulder and upper arm level, unspecified arm, initial encounter: Secondary | ICD-10-CM | POA: Insufficient documentation

## 2012-11-28 DIAGNOSIS — M542 Cervicalgia: Secondary | ICD-10-CM

## 2012-11-28 HISTORY — DX: Atherosclerotic heart disease of native coronary artery without angina pectoris: I25.10

## 2012-11-28 MED ORDER — OXYCODONE HCL 5 MG PO TABS: 5.0000 mg | ORAL_TABLET | ORAL | Status: DC | PRN

## 2012-11-28 NOTE — ED Notes (Signed)
Patient transported to CT 
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
## 2012-11-28 NOTE — ED Provider Notes (Signed)
Medical screening examination/treatment/procedure(s) were performed by non-physician practitioner and as supervising physician I was immediately available for consultation/collaboration.  Ethelda Chick, MD 11/28/12 2116

## 2014-04-11 ENCOUNTER — Encounter (HOSPITAL_COMMUNITY): Payer: Self-pay | Admitting: Emergency Medicine

## 2014-04-11 ENCOUNTER — Inpatient Hospital Stay (HOSPITAL_COMMUNITY)
Admission: EM | Admit: 2014-04-11 | Discharge: 2014-04-14 | DRG: 638 | Disposition: A | Discharge status: Home / Self Care | Payer: Non-veteran care | Attending: Internal Medicine | Admitting: Internal Medicine

## 2014-04-11 DIAGNOSIS — Z9114 Patient's other noncompliance with medication regimen: Secondary | ICD-10-CM | POA: Diagnosis present

## 2014-04-11 DIAGNOSIS — E1110 Type 2 diabetes mellitus with ketoacidosis without coma: Secondary | ICD-10-CM

## 2014-04-11 DIAGNOSIS — Z95810 Presence of automatic (implantable) cardiac defibrillator: Secondary | ICD-10-CM

## 2014-04-11 DIAGNOSIS — Z23 Encounter for immunization: Secondary | ICD-10-CM

## 2014-04-11 DIAGNOSIS — Z7982 Long term (current) use of aspirin: Secondary | ICD-10-CM

## 2014-04-11 DIAGNOSIS — F1290 Cannabis use, unspecified, uncomplicated: Secondary | ICD-10-CM | POA: Diagnosis present

## 2014-04-11 DIAGNOSIS — D696 Thrombocytopenia, unspecified: Secondary | ICD-10-CM | POA: Diagnosis present

## 2014-04-11 DIAGNOSIS — E039 Hypothyroidism, unspecified: Secondary | ICD-10-CM | POA: Diagnosis present

## 2014-04-11 DIAGNOSIS — Z8249 Family history of ischemic heart disease and other diseases of the circulatory system: Secondary | ICD-10-CM

## 2014-04-11 DIAGNOSIS — E1165 Type 2 diabetes mellitus with hyperglycemia: Principal | ICD-10-CM | POA: Diagnosis present

## 2014-04-11 DIAGNOSIS — Z902 Acquired absence of lung [part of]: Secondary | ICD-10-CM | POA: Diagnosis present

## 2014-04-11 DIAGNOSIS — Z880 Allergy status to penicillin: Secondary | ICD-10-CM

## 2014-04-11 DIAGNOSIS — Z794 Long term (current) use of insulin: Secondary | ICD-10-CM

## 2014-04-11 DIAGNOSIS — R739 Hyperglycemia, unspecified: Secondary | ICD-10-CM

## 2014-04-11 DIAGNOSIS — E119 Type 2 diabetes mellitus without complications: Secondary | ICD-10-CM | POA: Diagnosis present

## 2014-04-11 DIAGNOSIS — I2510 Atherosclerotic heart disease of native coronary artery without angina pectoris: Secondary | ICD-10-CM | POA: Diagnosis present

## 2014-04-11 DIAGNOSIS — E1142 Type 2 diabetes mellitus with diabetic polyneuropathy: Secondary | ICD-10-CM | POA: Diagnosis present

## 2014-04-11 DIAGNOSIS — I422 Other hypertrophic cardiomyopathy: Secondary | ICD-10-CM | POA: Diagnosis present

## 2014-04-11 DIAGNOSIS — E785 Hyperlipidemia, unspecified: Secondary | ICD-10-CM | POA: Diagnosis present

## 2014-04-11 DIAGNOSIS — Z9049 Acquired absence of other specified parts of digestive tract: Secondary | ICD-10-CM | POA: Diagnosis present

## 2014-04-11 DIAGNOSIS — I472 Ventricular tachycardia: Secondary | ICD-10-CM | POA: Diagnosis present

## 2014-04-11 DIAGNOSIS — Z91018 Allergy to other foods: Secondary | ICD-10-CM

## 2014-04-11 DIAGNOSIS — Z79899 Other long term (current) drug therapy: Secondary | ICD-10-CM

## 2014-04-11 DIAGNOSIS — D649 Anemia, unspecified: Secondary | ICD-10-CM | POA: Diagnosis not present

## 2014-04-11 DIAGNOSIS — D638 Anemia in other chronic diseases classified elsewhere: Secondary | ICD-10-CM

## 2014-04-11 DIAGNOSIS — K760 Fatty (change of) liver, not elsewhere classified: Secondary | ICD-10-CM | POA: Diagnosis present

## 2014-04-11 DIAGNOSIS — I10 Essential (primary) hypertension: Secondary | ICD-10-CM | POA: Diagnosis present

## 2014-04-11 DIAGNOSIS — M79605 Pain in left leg: Secondary | ICD-10-CM | POA: Diagnosis present

## 2014-04-11 DIAGNOSIS — M79604 Pain in right leg: Secondary | ICD-10-CM | POA: Diagnosis present

## 2014-04-11 LAB — COMPREHENSIVE METABOLIC PANEL
ALBUMIN: 2.9 g/dL — AB (ref 3.5–5.2)
ALT: 68 U/L — AB (ref 0–53)
AST: 96 U/L — AB (ref 0–37)
Alkaline Phosphatase: 143 U/L — ABNORMAL HIGH (ref 39–117)
Anion gap: 16 — ABNORMAL HIGH (ref 5–15)
BUN: 14 mg/dL (ref 6–23)
CALCIUM: 8.3 mg/dL — AB (ref 8.4–10.5)
CO2: 21 mEq/L (ref 19–32)
Chloride: 87 mEq/L — ABNORMAL LOW (ref 96–112)
Creatinine, Ser: 1.35 mg/dL (ref 0.50–1.35)
GFR calc non Af Amer: 54 mL/min — ABNORMAL LOW (ref >90)
GFR, EST AFRICAN AMERICAN: 63 mL/min — AB (ref >90)
GLUCOSE: 577 mg/dL — AB (ref 70–99)
Potassium: 4.3 mEq/L (ref 3.7–5.3)
SODIUM: 124 meq/L — AB (ref 137–147)
TOTAL PROTEIN: 6.8 g/dL (ref 6.0–8.3)
Total Bilirubin: 1.2 mg/dL (ref 0.3–1.2)

## 2014-04-11 LAB — URINALYSIS, ROUTINE W REFLEX MICROSCOPIC
Bilirubin Urine: NEGATIVE
Glucose, UA: >1000 mg/dL — AB
Ketones, ur: 15 mg/dL — AB
Leukocytes, UA: NEGATIVE
Nitrite: NEGATIVE
Protein, ur: NEGATIVE mg/dL
SPECIFIC GRAVITY, URINE: 1.03 (ref 1.005–1.030)
UROBILINOGEN UA: 2 mg/dL — AB (ref 0.0–1.0)
pH: 6.5 (ref 5.0–8.0)

## 2014-04-11 LAB — CBC WITH DIFFERENTIAL/PLATELET
BASOS ABS: 0 10*3/uL (ref 0.0–0.1)
BASOS PCT: 0 % (ref 0–1)
EOS ABS: 0 10*3/uL (ref 0.0–0.7)
EOS PCT: 0 % (ref 0–5)
HCT: 36.9 % — ABNORMAL LOW (ref 39.0–52.0)
Hemoglobin: 13 g/dL (ref 13.0–17.0)
Lymphocytes Relative: 19 % (ref 12–46)
Lymphs Abs: 0.9 10*3/uL (ref 0.7–4.0)
MCH: 27.6 pg (ref 26.0–34.0)
MCHC: 35.2 g/dL (ref 30.0–36.0)
MCV: 78.3 fL (ref 78.0–100.0)
Monocytes Absolute: 0.4 10*3/uL (ref 0.1–1.0)
Monocytes Relative: 8 % (ref 3–12)
Neutro Abs: 3.6 10*3/uL (ref 1.7–7.7)
Neutrophils Relative %: 73 % (ref 43–77)
PLATELETS: 71 10*3/uL — AB (ref 150–400)
RBC: 4.71 MIL/uL (ref 4.22–5.81)
RDW: 13.3 % (ref 11.5–15.5)
WBC: 4.9 10*3/uL (ref 4.0–10.5)

## 2014-04-11 LAB — I-STAT VENOUS BLOOD GAS, ED
Acid-Base Excess: 4 mmol/L — ABNORMAL HIGH (ref 0.0–2.0)
Bicarbonate: 28.7 mEq/L — ABNORMAL HIGH (ref 20.0–24.0)
O2 Saturation: 38 %
PCO2 VEN: 40.6 mmHg — AB (ref 45.0–50.0)
PH VEN: 7.457 — AB (ref 7.250–7.300)
TCO2: 30 mmol/L (ref 0–100)
pO2, Ven: 21 mmHg — CL (ref 30.0–45.0)

## 2014-04-11 LAB — URINE MICROSCOPIC-ADD ON

## 2014-04-11 LAB — CBG MONITORING, ED: GLUCOSE-CAPILLARY: 443 mg/dL — AB (ref 70–99)

## 2014-04-11 MED ORDER — SODIUM CHLORIDE 0.9 % IV BOLUS (SEPSIS)
1000.0000 mL | Freq: Once | INTRAVENOUS | Status: AC
  Administered 2014-04-11: 1000 mL via INTRAVENOUS

## 2014-04-11 MED ORDER — SODIUM CHLORIDE 0.9 % IV SOLN
Freq: Once | INTRAVENOUS | Status: AC
  Administered 2014-04-11: via INTRAVENOUS

## 2014-04-11 MED ORDER — SODIUM CHLORIDE 0.9 % IV SOLN
INTRAVENOUS | Status: DC
  Administered 2014-04-11: 3.8 [IU]/h via INTRAVENOUS
  Administered 2014-04-12: 1.5 [IU]/h via INTRAVENOUS
  Filled 2014-04-11: qty 2.5

## 2014-04-11 NOTE — ED Notes (Signed)
Venous blood gas result shown to Dr. Doy Mince

## 2014-04-11 NOTE — ED Notes (Signed)
Pt. reports bilateral lower legs and feet pain for several months , denies injury or fall , ambulatory using his cane .

## 2014-04-11 NOTE — ED Provider Notes (Signed)
CSN: [id number]     Arrival date & time 04/11/14  1924 History   First MD Initiated Contact with Patient 04/11/14 2054     Chief Complaint  Patient presents with  . Extremity Pain     (Consider location/radiation/quality/duration/timing/severity/associated sxs/prior Treatment) Patient is a 63 y.o. male presenting with general illness.  Illness Severity:  Moderate Onset quality:  Gradual Duration: a few weeks, worse today. Timing:  Constant Progression:  Worsening Chronicity:  New Context:  Fell asleep prior to taking his insulin last night. Relieved by:  Nothing Worsened by:  Nothing Associated symptoms: fatigue   Associated symptoms: no abdominal pain, no chest pain, no fever, no nausea, no shortness of breath and no vomiting   Associated symptoms comment:  Bilateral lower extremity tingling from knees down.   Past Medical History  Diagnosis Date  . Diabetes mellitus without complication   . Coronary artery disease    Past Surgical History  Procedure Laterality Date  . Cardiac defibrillator placement     No family history on file. History  Substance Use Topics  . Smoking status: Never Smoker   . Smokeless tobacco: Not on file  . Alcohol Use: No    Review of Systems  Constitutional: Positive for fatigue. Negative for fever.  Respiratory: Negative for shortness of breath.   Cardiovascular: Negative for chest pain.  Gastrointestinal: Negative for nausea, vomiting and abdominal pain.  All other systems reviewed and are negative.     Allergies  Amoxicillin and Grapefruit extract  Home Medications   Prior to Admission medications   Medication Sig Start Date End Date Taking? Authorizing Provider  amLODipine (NORVASC) 10 MG tablet Take 10 mg by mouth daily.   Yes Historical Provider, MD  aspirin 81 MG tablet Take 81 mg by mouth daily.   Yes Historical Provider, MD  atorvastatin (LIPITOR) 20 MG tablet Take 20 mg by mouth daily.   Yes Historical Provider, MD   insulin glargine (LANTUS) 100 UNIT/ML injection Inject 28 Units into the skin at bedtime.   Yes Historical Provider, MD  labetalol (NORMODYNE) 100 MG tablet Take 100 mg by mouth 2 (two) times daily.   Yes Historical Provider, MD  levothyroxine (SYNTHROID, LEVOTHROID) 88 MCG tablet Take 88 mcg by mouth daily before breakfast.   Yes Historical Provider, MD  lisinopril (PRINIVIL,ZESTRIL) 40 MG tablet Take 40 mg by mouth daily.   Yes Historical Provider, MD  polyvinyl alcohol (LIQUIFILM TEARS) 1.4 % ophthalmic solution Place 1 drop into both eyes 2 (two) times daily as needed (dry eyes).   Yes Historical Provider, MD  sildenafil (VIAGRA) 50 MG tablet Take 50 mg by mouth daily as needed for erectile dysfunction.   Yes Historical Provider, MD  terazosin (HYTRIN) 2 MG capsule Take 2 mg by mouth at bedtime.   Yes Historical Provider, MD  oxyCODONE (ROXICODONE) 5 MG immediate release tablet Take 1 tablet (5 mg total) by mouth every 4 (four) hours as needed for pain. 11/28/12   Larene Pickett, PA-C  PRESCRIPTION MEDICATION 4 high blood pressure meds, thyroid medication, cholesterol medication, medication for digestive system - all from [place] Provider, MD   BP 175/93 mmHg  Pulse 77  Temp(Src) 98.6 F (37 C) (Oral)  Resp 17  Ht 6\' 1"  (1.854 m)  Wt 153 lb (69.4 kg)  BMI 20.19 kg/m2  SpO2 99% Physical Exam  Constitutional: He is oriented to person, place, and time. He appears well-developed and well-nourished.  Non-toxic appearance.  He appears ill. No distress.  HENT:  Head: Normocephalic and atraumatic.  Mouth/Throat: Oropharynx is clear and moist.  Eyes: Conjunctivae are normal. Pupils are equal, round, and reactive to light. No scleral icterus.  Neck: Neck supple.  Cardiovascular: Normal rate, regular rhythm, normal heart sounds and intact distal pulses.   No murmur heard. Pulmonary/Chest: Effort normal and breath sounds normal. No stridor. No respiratory distress. He has no  wheezes. He has no rales.  Abdominal: Soft. He exhibits no distension. There is no tenderness. There is no rigidity, no rebound and no guarding.  Musculoskeletal: Normal range of motion. He exhibits no edema.  Neurological: He is alert and oriented to person, place, and time.  Skin: Skin is warm and dry. No rash noted.  Psychiatric: He has a normal mood and affect. His behavior is normal.  Nursing note and vitals reviewed.   ED Course  Procedures (including critical care time) Labs Review Labs Reviewed  CBC WITH DIFFERENTIAL - Abnormal; Notable for the following:    HCT 36.9 (*)    Platelets 71 (*)    All other components within normal limits  COMPREHENSIVE METABOLIC PANEL - Abnormal; Notable for the following:    Sodium 124 (*)    Chloride 87 (*)    Glucose, Bld 577 (*)    Calcium 8.3 (*)    Albumin 2.9 (*)    AST 96 (*)    ALT 68 (*)    Alkaline Phosphatase 143 (*)    GFR calc non Af Amer 54 (*)    GFR calc Af Amer 63 (*)    Anion gap 16 (*)    All other components within normal limits  I-STAT VENOUS BLOOD GAS, ED - Abnormal; Notable for the following:    pH, Ven 7.457 (*)    pCO2, Ven 40.6 (*)    pO2, Ven 21.0 (*)    Bicarbonate 28.7 (*)    Acid-Base Excess 4.0 (*)    All other components within normal limits  BLOOD GAS, VENOUS  URINALYSIS, ROUTINE W REFLEX MICROSCOPIC    Imaging Review No results found.   EKG Interpretation None      MDM   Final diagnoses:  Hyperglycemia    63 year old male with history of diabetes presenting with malaise and lower extremity tingling. Blood sugars found to be in the upper 500s. Treated with IV fluids and IV insulin.Admitted by internal medicine.    Artis Delay, MD 04/12/14 [phone]

## 2014-04-12 ENCOUNTER — Inpatient Hospital Stay (HOSPITAL_COMMUNITY): Payer: Non-veteran care

## 2014-04-12 DIAGNOSIS — Z23 Encounter for immunization: Secondary | ICD-10-CM | POA: Diagnosis not present

## 2014-04-12 DIAGNOSIS — E1165 Type 2 diabetes mellitus with hyperglycemia: Secondary | ICD-10-CM | POA: Diagnosis present

## 2014-04-12 DIAGNOSIS — E039 Hypothyroidism, unspecified: Secondary | ICD-10-CM | POA: Diagnosis present

## 2014-04-12 DIAGNOSIS — Z902 Acquired absence of lung [part of]: Secondary | ICD-10-CM | POA: Diagnosis present

## 2014-04-12 DIAGNOSIS — Z8249 Family history of ischemic heart disease and other diseases of the circulatory system: Secondary | ICD-10-CM | POA: Diagnosis not present

## 2014-04-12 DIAGNOSIS — Z794 Long term (current) use of insulin: Secondary | ICD-10-CM | POA: Diagnosis not present

## 2014-04-12 DIAGNOSIS — I10 Essential (primary) hypertension: Secondary | ICD-10-CM | POA: Diagnosis present

## 2014-04-12 DIAGNOSIS — Z79899 Other long term (current) drug therapy: Secondary | ICD-10-CM | POA: Diagnosis not present

## 2014-04-12 DIAGNOSIS — Z9049 Acquired absence of other specified parts of digestive tract: Secondary | ICD-10-CM | POA: Diagnosis present

## 2014-04-12 DIAGNOSIS — M79604 Pain in right leg: Secondary | ICD-10-CM | POA: Diagnosis present

## 2014-04-12 DIAGNOSIS — E785 Hyperlipidemia, unspecified: Secondary | ICD-10-CM | POA: Diagnosis present

## 2014-04-12 DIAGNOSIS — R739 Hyperglycemia, unspecified: Secondary | ICD-10-CM | POA: Diagnosis present

## 2014-04-12 DIAGNOSIS — I422 Other hypertrophic cardiomyopathy: Secondary | ICD-10-CM | POA: Diagnosis present

## 2014-04-12 DIAGNOSIS — I472 Ventricular tachycardia: Secondary | ICD-10-CM | POA: Diagnosis present

## 2014-04-12 DIAGNOSIS — D649 Anemia, unspecified: Secondary | ICD-10-CM | POA: Diagnosis not present

## 2014-04-12 DIAGNOSIS — E1142 Type 2 diabetes mellitus with diabetic polyneuropathy: Secondary | ICD-10-CM | POA: Diagnosis present

## 2014-04-12 DIAGNOSIS — Z9114 Patient's other noncompliance with medication regimen: Secondary | ICD-10-CM | POA: Diagnosis present

## 2014-04-12 DIAGNOSIS — M79605 Pain in left leg: Secondary | ICD-10-CM | POA: Diagnosis present

## 2014-04-12 DIAGNOSIS — K760 Fatty (change of) liver, not elsewhere classified: Secondary | ICD-10-CM | POA: Diagnosis present

## 2014-04-12 DIAGNOSIS — Z91018 Allergy to other foods: Secondary | ICD-10-CM | POA: Diagnosis not present

## 2014-04-12 DIAGNOSIS — Z880 Allergy status to penicillin: Secondary | ICD-10-CM | POA: Diagnosis not present

## 2014-04-12 DIAGNOSIS — Z95810 Presence of automatic (implantable) cardiac defibrillator: Secondary | ICD-10-CM | POA: Diagnosis not present

## 2014-04-12 DIAGNOSIS — D696 Thrombocytopenia, unspecified: Secondary | ICD-10-CM | POA: Diagnosis present

## 2014-04-12 DIAGNOSIS — F1290 Cannabis use, unspecified, uncomplicated: Secondary | ICD-10-CM | POA: Diagnosis present

## 2014-04-12 DIAGNOSIS — I2510 Atherosclerotic heart disease of native coronary artery without angina pectoris: Secondary | ICD-10-CM | POA: Diagnosis present

## 2014-04-12 DIAGNOSIS — Z7982 Long term (current) use of aspirin: Secondary | ICD-10-CM | POA: Diagnosis not present

## 2014-04-12 LAB — CBC
HCT: 35.8 % — ABNORMAL LOW (ref 39.0–52.0)
HEMOGLOBIN: 12.3 g/dL — AB (ref 13.0–17.0)
MCH: 27.6 pg (ref 26.0–34.0)
MCHC: 34.4 g/dL (ref 30.0–36.0)
MCV: 80.3 fL (ref 78.0–100.0)
Platelets: 59 10*3/uL — ABNORMAL LOW (ref 150–400)
RBC: 4.46 MIL/uL (ref 4.22–5.81)
RDW: 13.7 % (ref 11.5–15.5)
WBC: 4.6 10*3/uL (ref 4.0–10.5)

## 2014-04-12 LAB — GLUCOSE, CAPILLARY
GLUCOSE-CAPILLARY: 253 mg/dL — AB (ref 70–99)
GLUCOSE-CAPILLARY: 208 mg/dL — AB (ref 70–99)
GLUCOSE-CAPILLARY: 133 mg/dL — AB (ref 70–99)
GLUCOSE-CAPILLARY: 269 mg/dL — AB (ref 70–99)
Glucose-Capillary: 290 mg/dL — ABNORMAL HIGH (ref 70–99)
Glucose-Capillary: 252 mg/dL — ABNORMAL HIGH (ref 70–99)
Glucose-Capillary: 145 mg/dL — ABNORMAL HIGH (ref 70–99)
Glucose-Capillary: 108 mg/dL — ABNORMAL HIGH (ref 70–99)
Glucose-Capillary: 90 mg/dL (ref 70–99)
Glucose-Capillary: 223 mg/dL — ABNORMAL HIGH (ref 70–99)
Glucose-Capillary: 187 mg/dL — ABNORMAL HIGH (ref 70–99)
Glucose-Capillary: 227 mg/dL — ABNORMAL HIGH (ref 70–99)

## 2014-04-12 LAB — BASIC METABOLIC PANEL
Anion gap: 14 (ref 5–15)
BUN: 13 mg/dL (ref 6–23)
CHLORIDE: 99 meq/L (ref 96–112)
CO2: 22 mEq/L (ref 19–32)
Calcium: 8 mg/dL — ABNORMAL LOW (ref 8.4–10.5)
Creatinine, Ser: 1.25 mg/dL (ref 0.50–1.35)
GFR calc Af Amer: 69 mL/min — ABNORMAL LOW (ref >90)
GFR calc non Af Amer: 59 mL/min — ABNORMAL LOW (ref >90)
GLUCOSE: 63 mg/dL — AB (ref 70–99)
Potassium: 3.4 mEq/L — ABNORMAL LOW (ref 3.7–5.3)
Sodium: 135 mEq/L — ABNORMAL LOW (ref 137–147)

## 2014-04-12 LAB — HEMOGLOBIN A1C
Hgb A1c MFr Bld: 15.7 % — ABNORMAL HIGH (ref <5.7)
Mean Plasma Glucose: 404 mg/dL — ABNORMAL HIGH (ref <117)

## 2014-04-12 LAB — CBG MONITORING, ED: Glucose-Capillary: 407 mg/dL — ABNORMAL HIGH (ref 70–99)

## 2014-04-12 MED ORDER — INSULIN ASPART 100 UNIT/ML ~~LOC~~ SOLN: 0.0000 [IU] | Freq: Three times a day (TID) | SUBCUTANEOUS | Status: DC

## 2014-04-12 MED ORDER — GABAPENTIN 600 MG PO TABS
300.0000 mg | ORAL_TABLET | Freq: Every day | ORAL | Status: DC
  Filled 2014-04-12: qty 0.5

## 2014-04-12 MED ORDER — INSULIN GLARGINE 100 UNIT/ML ~~LOC~~ SOLN
20.0000 [IU] | Freq: Every day | SUBCUTANEOUS | Status: DC
  Administered 2014-04-12 – 2014-04-13 (×2): 20 [IU] via SUBCUTANEOUS
  Filled 2014-04-12 (×2): qty 0.2

## 2014-04-12 MED ORDER — SODIUM CHLORIDE 0.9 % IV SOLN
INTRAVENOUS | Status: DC
  Administered 2014-04-12: 02:00:00 via INTRAVENOUS

## 2014-04-12 MED ORDER — ONDANSETRON HCL 4 MG PO TABS: 4.0000 mg | ORAL_TABLET | Freq: Four times a day (QID) | ORAL | Status: DC | PRN

## 2014-04-12 MED ORDER — INFLUENZA VAC SPLIT QUAD 0.5 ML IM SUSY
0.5000 mL | PREFILLED_SYRINGE | INTRAMUSCULAR | Status: AC
  Administered 2014-04-13: 0.5 mL via INTRAMUSCULAR
  Filled 2014-04-12 (×2): qty 0.5

## 2014-04-12 MED ORDER — ACETAMINOPHEN 650 MG RE SUPP: 650.0000 mg | Freq: Four times a day (QID) | RECTAL | Status: DC | PRN

## 2014-04-12 MED ORDER — ATORVASTATIN CALCIUM 20 MG PO TABS
20.0000 mg | ORAL_TABLET | Freq: Every day | ORAL | Status: DC
  Administered 2014-04-12 – 2014-04-14 (×3): 20 mg via ORAL
  Filled 2014-04-12 (×3): qty 1

## 2014-04-12 MED ORDER — GABAPENTIN 300 MG PO CAPS
300.0000 mg | ORAL_CAPSULE | Freq: Every day | ORAL | Status: DC
  Administered 2014-04-12 – 2014-04-13 (×3): 300 mg via ORAL
  Filled 2014-04-12 (×4): qty 1

## 2014-04-12 MED ORDER — AMLODIPINE BESYLATE 10 MG PO TABS
10.0000 mg | ORAL_TABLET | Freq: Every day | ORAL | Status: DC
  Administered 2014-04-12 – 2014-04-14 (×3): 10 mg via ORAL
  Filled 2014-04-12 (×3): qty 1

## 2014-04-12 MED ORDER — TERAZOSIN HCL 2 MG PO CAPS
2.0000 mg | ORAL_CAPSULE | Freq: Every day | ORAL | Status: DC
  Administered 2014-04-12 – 2014-04-13 (×3): 2 mg via ORAL
  Filled 2014-04-12 (×4): qty 1

## 2014-04-12 MED ORDER — INSULIN ASPART 100 UNIT/ML ~~LOC~~ SOLN
6.0000 [IU] | Freq: Once | SUBCUTANEOUS | Status: AC
  Administered 2014-04-12: 6 [IU] via SUBCUTANEOUS

## 2014-04-12 MED ORDER — LEVOTHYROXINE SODIUM 88 MCG PO TABS
88.0000 ug | ORAL_TABLET | Freq: Every day | ORAL | Status: DC
  Administered 2014-04-12 – 2014-04-14 (×3): 88 ug via ORAL
  Filled 2014-04-12 (×4): qty 1

## 2014-04-12 MED ORDER — LISINOPRIL 40 MG PO TABS
40.0000 mg | ORAL_TABLET | Freq: Every day | ORAL | Status: DC
  Administered 2014-04-12 – 2014-04-14 (×3): 40 mg via ORAL
  Filled 2014-04-12 (×3): qty 1

## 2014-04-12 MED ORDER — INSULIN GLARGINE 100 UNIT/ML ~~LOC~~ SOLN
6.0000 [IU] | Freq: Once | SUBCUTANEOUS | Status: AC
  Administered 2014-04-12: 6 [IU] via SUBCUTANEOUS
  Filled 2014-04-12: qty 0.06

## 2014-04-12 MED ORDER — LABETALOL HCL 100 MG PO TABS
100.0000 mg | ORAL_TABLET | Freq: Two times a day (BID) | ORAL | Status: DC
  Administered 2014-04-12 – 2014-04-14 (×6): 100 mg via ORAL
  Filled 2014-04-12 (×7): qty 1

## 2014-04-12 MED ORDER — DEXTROSE-NACL 5-0.9 % IV SOLN
INTRAVENOUS | Status: DC
  Administered 2014-04-12: 07:00:00 via INTRAVENOUS

## 2014-04-12 MED ORDER — INSULIN ASPART 100 UNIT/ML ~~LOC~~ SOLN
0.0000 [IU] | Freq: Three times a day (TID) | SUBCUTANEOUS | Status: DC
  Administered 2014-04-12: 3 [IU] via SUBCUTANEOUS
  Administered 2014-04-13: 5 [IU] via SUBCUTANEOUS
  Administered 2014-04-13: 11 [IU] via SUBCUTANEOUS
  Administered 2014-04-13 – 2014-04-14 (×2): 5 [IU] via SUBCUTANEOUS

## 2014-04-12 MED ORDER — SODIUM CHLORIDE 0.9 % IV SOLN
INTRAVENOUS | Status: DC
  Administered 2014-04-12: 15:00:00 via INTRAVENOUS

## 2014-04-12 MED ORDER — SODIUM CHLORIDE 0.9 % IV SOLN
INTRAVENOUS | Status: AC
Start: 2014-04-12 — End: 2014-04-13

## 2014-04-12 MED ORDER — OXYCODONE HCL 5 MG PO TABS: 5.0000 mg | ORAL_TABLET | ORAL | Status: DC | PRN

## 2014-04-12 MED ORDER — ACETAMINOPHEN 325 MG PO TABS: 650.0000 mg | ORAL_TABLET | Freq: Four times a day (QID) | ORAL | Status: DC | PRN

## 2014-04-12 MED ORDER — SILDENAFIL CITRATE 50 MG PO TABS: 50.0000 mg | ORAL_TABLET | Freq: Every day | ORAL | Status: DC | PRN

## 2014-04-12 MED ORDER — MORPHINE SULFATE 2 MG/ML IJ SOLN: 2.0000 mg | INTRAMUSCULAR | Status: DC | PRN

## 2014-04-12 MED ORDER — ASPIRIN EC 81 MG PO TBEC
81.0000 mg | DELAYED_RELEASE_TABLET | Freq: Every day | ORAL | Status: DC
  Administered 2014-04-12 – 2014-04-14 (×3): 81 mg via ORAL
  Filled 2014-04-12 (×3): qty 1

## 2014-04-12 MED ORDER — POLYVINYL ALCOHOL 1.4 % OP SOLN
1.0000 [drp] | Freq: Two times a day (BID) | OPHTHALMIC | Status: DC | PRN
  Filled 2014-04-12: qty 15

## 2014-04-12 MED ORDER — INSULIN GLARGINE 100 UNIT/ML ~~LOC~~ SOLN: 28.0000 [IU] | Freq: Every day | SUBCUTANEOUS | Status: DC

## 2014-04-12 MED ORDER — ONDANSETRON HCL 4 MG/2ML IJ SOLN: 4.0000 mg | Freq: Four times a day (QID) | INTRAMUSCULAR | Status: DC | PRN

## 2014-04-12 MED ORDER — ZOLPIDEM TARTRATE 5 MG PO TABS: 5.0000 mg | ORAL_TABLET | Freq: Every evening | ORAL | Status: DC | PRN

## 2014-04-12 MED ORDER — INSULIN ASPART 100 UNIT/ML ~~LOC~~ SOLN: 0.0000 [IU] | Freq: Every day | SUBCUTANEOUS | Status: DC

## 2014-04-12 NOTE — Plan of Care (Signed)
Problem: Phase I Progression Outcomes Goal: Initial discharge plan identified Outcome: Completed/Met Date Met:  04/12/14     
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
## 2014-04-12 NOTE — Plan of Care (Signed)
Problem: Phase I Progression Outcomes Goal: NPO or per MD order Outcome: Adequate for Discharge

## 2014-04-12 NOTE — Progress Notes (Signed)
Admission Note:  Patient transferred from ED by bed with ED RN and daughter. Patient in no pain. Patient high fall risk, patient in yellow socks, has yellow arm band, and bed alarm on. Patient alert and oriented x4. Patient didn't want to do admission questions until morning because he was tired. Patient came up on insulin drip. Patient has small wound on left leg.

## 2014-04-12 NOTE — H&P (Addendum)
[facility]                                                                                    Patient Demographics  Anthony Mcfarland, is a 63 y.o. male  CSN: [id number]  MRN: [id number]  DOB - [birth year]  Admit Date - 04/11/2014  Outpatient Primary MD for the patient is Pcp Not In System   With History of -  Past Medical History  Diagnosis Date  . Diabetes mellitus without complication   . Coronary artery disease       Past Surgical History  Procedure Laterality Date  . Cardiac defibrillator placement      in for   Chief Complaint  Patient presents with  . Extremity Pain     HPI  Anthony Mcfarland  is a 63 y.o. male,who usually follows at the [place] with Dr. Alvino Blood with past medical history significant for diabetes mellitus on insulin presenting with hyperglycemia and lower extremity pain. He said that he didn't take his Lantus for the last 2 days. Denies nausea , vomiting ,abdominal pain or diarrhea. His blood sugar in the emergency room was 577 and he had a slightly elevated gap although his pH was normal. Patient was started on glucose stabilizer and I was called to admit. The pain in his lower extremities started 4 months ago and has been on and off . It was noted that his platelet count was 71,000 and he has no history of thrombocytopenia .    Review of Systems    In addition to the HPI above,  No Fever-chills, No Headache, No changes with Vision or hearing, No problems swallowing food or Liquids, No Chest pain, Cough or Shortness of Breath, No Abdominal pain, No Nausea or Vommitting, Bowel movements are regular, No Blood in stool or Urine, No dysuria, No new skin rashes or bruises,  No new weakness, tingling, numbness in any extremity, No recent weight gain or loss, No polyuria, polydypsia or polyphagia, No significant Mental Stressors.  A full 10 point Review of Systems was done, except as stated above, all other Review of Systems were  negative.   Social History History  Substance Use Topics  . Smoking status: Never Smoker   . Smokeless tobacco: Not on file  . Alcohol Use: No     Family History No family history on file.   Prior to Admission medications   Medication Sig Start Date End Date Taking? Authorizing Provider  amLODipine (NORVASC) 10 MG tablet Take 10 mg by mouth daily.   Yes Historical Provider, MD  aspirin 81 MG tablet Take 81 mg by mouth daily.   Yes Historical Provider, MD  atorvastatin (LIPITOR) 20 MG tablet Take 20 mg by mouth daily.   Yes Historical Provider, MD  insulin glargine (LANTUS) 100 UNIT/ML injection Inject 28 Units into the skin at bedtime.   Yes Historical Provider, MD  labetalol (NORMODYNE) 100 MG tablet Take 100 mg by mouth 2 (two) times daily.   Yes Historical Provider, MD  levothyroxine (SYNTHROID, LEVOTHROID) 88 MCG tablet Take 88 mcg by mouth daily before breakfast.   Yes Historical Provider, MD  lisinopril (PRINIVIL,ZESTRIL) 40 MG  tablet Take 40 mg by mouth daily.   Yes Historical Provider, MD  polyvinyl alcohol (LIQUIFILM TEARS) 1.4 % ophthalmic solution Place 1 drop into both eyes 2 (two) times daily as needed (dry eyes).   Yes Historical Provider, MD  sildenafil (VIAGRA) 50 MG tablet Take 50 mg by mouth daily as needed for erectile dysfunction.   Yes Historical Provider, MD  terazosin (HYTRIN) 2 MG capsule Take 2 mg by mouth at bedtime.   Yes Historical Provider, MD  oxyCODONE (ROXICODONE) 5 MG immediate release tablet Take 1 tablet (5 mg total) by mouth every 4 (four) hours as needed for pain. 11/28/12   Larene Pickett, PA-C  PRESCRIPTION MEDICATION 4 high blood pressure meds, thyroid medication, cholesterol medication, medication for digestive system - all from [place] Provider, MD    Allergies  Allergen Reactions  . Amoxicillin     unknown  . Grapefruit Extract     [facility] told him not to eat    Physical Exam  Vitals  Blood pressure 175/93, pulse  77, temperature 98.6 F (37 C), temperature source Oral, resp. rate 17, height 6\' 1"  (1.854 m), weight 69.4 kg (153 lb), SpO2 99 %.   1. General elderly gentleman looks chronically ill in no significant distress  2. Normal affect and insight, Not Suicidal or Homicidal, Awake Alert, Oriented X 3.  3. No F.N deficitsgrossly, ALL C.Nerves Intact, Strength 5/5 all 4 extremities,.  4. Ears and Eyes appear Normal, Conjunctivae clear, PERRLA. Moist Oral Mucosa.  5. Supple Neck, No JVD, No cervical lymphadenopathy appriciated, No Carotid Bruits.  6. Symmetrical Chest wall movement, Good air movement bilaterally, CTAB.  7.irregular, No Gallops, Rubs or Murmurs, No Parasternal Heave.  8. Positive Bowel Sounds, Abdomen Soft, Non tender, No organomegaly appriciated,No rebound -guarding or rigidity.  9.  No Cyanosis, Normal Skin Turgor, No Skin Rash or Bruise.  10. Good muscle tone,  joints appear normal , no effusions, Normal ROM.    Data Review  CBC  Recent Labs Lab 04/11/14 1946  WBC 4.9  HGB 13.0  HCT 36.9*  PLT 71*  MCV 78.3  MCH 27.6  MCHC 35.2  RDW 13.3  LYMPHSABS 0.9  MONOABS 0.4  EOSABS 0.0  BASOSABS 0.0   ------------------------------------------------------------------------------------------------------------------  Chemistries   Recent Labs Lab 04/11/14 1946  NA 124*  K 4.3  CL 87*  CO2 21  GLUCOSE 577*  BUN 14  CREATININE 1.35  CALCIUM 8.3*  AST 96*  ALT 68*  ALKPHOS 143*  BILITOT 1.2   ------------------------------------------------------------------------------------------------------------------ estimated creatinine clearance is 54.3 mL/min (by C-G formula based on Cr of 1.35). ------------------------------------------------------------------------------------------------------------------ No results for input(s): TSH, T4TOTAL, T3FREE, THYROIDAB in the last 72 hours.  Invalid input(s): FREET3   Coagulation profile No results for  input(s): INR, PROTIME in the last 168 hours. ------------------------------------------------------------------------------------------------------------------- No results for input(s): DDIMER in the last 72 hours. -------------------------------------------------------------------------------------------------------------------  Cardiac Enzymes No results for input(s): CKMB, TROPONINI, MYOGLOBIN in the last 168 hours.  Invalid input(s): CK ------------------------------------------------------------------------------------------------------------------ Invalid input(s): POCBNP   ---------------------------------------------------------------------------------------------------------------  Urinalysis    Component Value Date/Time   COLORURINE YELLOW 04/11/2014 2249   APPEARANCEUR CLEAR 04/11/2014 2249   LABSPEC 1.030 04/11/2014 2249   PHURINE 6.5 04/11/2014 2249   GLUCOSEU >1000* 04/11/2014 2249   HGBUR SMALL* 04/11/2014 2249   BILIRUBINUR NEGATIVE 04/11/2014 2249   KETONESUR 15* 04/11/2014 2249   PROTEINUR NEGATIVE 04/11/2014 2249   UROBILINOGEN 2.0* 04/11/2014 2249   NITRITE NEGATIVE 04/11/2014  [place] 04/11/2014 2249    ----------------------------------------------------------------------------------------------------------------   Imaging results:   No results found.    Assessment & Plan  1. Hyperglycemia due to noncompliance with slightly elevated anion gap and normal pH 2. Diabetic neuropathy 3. History of chronic artery disease 4. Status post cardiac defibrillator 5.thrombocytopenia; unknown cause  Plan  Place in observation Glucose stabilizer started IV fluids with normal saline Insulin sliding scale follow platelets in a.m. [facility]   DVT Prophylaxis SCDs  AM Labs Ordered, also please review Full Orders  Code Status full  Disposition Plan: home  Time spent in minutes : 34 minutes  Condition  GUARDED   @SIGNATURE @

## 2014-04-12 NOTE — ED Notes (Signed)
CBG = 407.  RN informed.

## 2014-04-12 NOTE — Plan of Care (Signed)
Problem: Phase I Progression Outcomes Goal: CBGs steadily decreasing with treatment Outcome: Progressing Goal: Pain controlled with appropriate interventions Outcome: Completed/Met Date Met:  04/12/14 Goal: OOB as tolerated unless otherwise ordered Outcome: Completed/Met Date Met:  04/12/14 Goal: Voiding-avoid urinary catheter unless indicated Outcome: Completed/Met Date Met:  04/12/14

## 2014-04-12 NOTE — Plan of Care (Signed)
Problem: Phase I Progression Outcomes Goal: Diabetes Coordinator Consult Outcome: Progressing

## 2014-04-13 ENCOUNTER — Other Ambulatory Visit: Payer: Self-pay | Admitting: Hematology and Oncology

## 2014-04-13 ENCOUNTER — Encounter: Payer: Self-pay | Admitting: Hematology and Oncology

## 2014-04-13 ENCOUNTER — Encounter (HOSPITAL_COMMUNITY): Payer: Self-pay | Admitting: Hematology and Oncology

## 2014-04-13 DIAGNOSIS — D638 Anemia in other chronic diseases classified elsewhere: Secondary | ICD-10-CM

## 2014-04-13 DIAGNOSIS — R739 Hyperglycemia, unspecified: Secondary | ICD-10-CM

## 2014-04-13 DIAGNOSIS — K760 Fatty (change of) liver, not elsewhere classified: Secondary | ICD-10-CM

## 2014-04-13 DIAGNOSIS — E1310 Other specified diabetes mellitus with ketoacidosis without coma: Secondary | ICD-10-CM

## 2014-04-13 DIAGNOSIS — Z794 Long term (current) use of insulin: Secondary | ICD-10-CM

## 2014-04-13 DIAGNOSIS — D696 Thrombocytopenia, unspecified: Secondary | ICD-10-CM

## 2014-04-13 DIAGNOSIS — E1165 Type 2 diabetes mellitus with hyperglycemia: Principal | ICD-10-CM

## 2014-04-13 DIAGNOSIS — I2510 Atherosclerotic heart disease of native coronary artery without angina pectoris: Secondary | ICD-10-CM

## 2014-04-13 DIAGNOSIS — R7989 Other specified abnormal findings of blood chemistry: Secondary | ICD-10-CM

## 2014-04-13 DIAGNOSIS — D649 Anemia, unspecified: Secondary | ICD-10-CM | POA: Insufficient documentation

## 2014-04-13 DIAGNOSIS — E119 Type 2 diabetes mellitus without complications: Secondary | ICD-10-CM | POA: Diagnosis present

## 2014-04-13 HISTORY — DX: Anemia in other chronic diseases classified elsewhere: D63.8

## 2014-04-13 LAB — GLUCOSE, CAPILLARY
GLUCOSE-CAPILLARY: 345 mg/dL — AB (ref 70–99)
GLUCOSE-CAPILLARY: 222 mg/dL — AB (ref 70–99)
GLUCOSE-CAPILLARY: 239 mg/dL — AB (ref 70–99)
GLUCOSE-CAPILLARY: 163 mg/dL — AB (ref 70–99)

## 2014-04-13 LAB — CBC
HCT: 32.6 % — ABNORMAL LOW (ref 39.0–52.0)
Hemoglobin: 11.1 g/dL — ABNORMAL LOW (ref 13.0–17.0)
MCH: 27.5 pg (ref 26.0–34.0)
MCHC: 34 g/dL (ref 30.0–36.0)
MCV: 80.7 fL (ref 78.0–100.0)
Platelets: 55 10*3/uL — ABNORMAL LOW (ref 150–400)
RBC: 4.04 MIL/uL — ABNORMAL LOW (ref 4.22–5.81)
RDW: 13.8 % (ref 11.5–15.5)
WBC: 3.9 10*3/uL — ABNORMAL LOW (ref 4.0–10.5)

## 2014-04-13 LAB — CBC WITH DIFFERENTIAL/PLATELET
BASOS ABS: 0 10*3/uL (ref 0.0–0.1)
Basophils Relative: 0 % (ref 0–1)
Eosinophils Absolute: 0 10*3/uL (ref 0.0–0.7)
Eosinophils Relative: 0 % (ref 0–5)
HEMATOCRIT: 32.4 % — AB (ref 39.0–52.0)
HEMOGLOBIN: 11.3 g/dL — AB (ref 13.0–17.0)
Lymphocytes Relative: 26 % (ref 12–46)
Lymphs Abs: 1 10*3/uL (ref 0.7–4.0)
MCH: 27.4 pg (ref 26.0–34.0)
MCHC: 34.9 g/dL (ref 30.0–36.0)
MCV: 78.5 fL (ref 78.0–100.0)
MONO ABS: 0.4 10*3/uL (ref 0.1–1.0)
MONOS PCT: 9 % (ref 3–12)
NEUTROS ABS: 2.4 10*3/uL (ref 1.7–7.7)
Neutrophils Relative %: 64 % (ref 43–77)
Platelets: 61 10*3/uL — ABNORMAL LOW (ref 150–400)
RBC: 4.13 MIL/uL — ABNORMAL LOW (ref 4.22–5.81)
RDW: 13.5 % (ref 11.5–15.5)
WBC: 3.7 10*3/uL — AB (ref 4.0–10.5)

## 2014-04-13 LAB — DIC (DISSEMINATED INTRAVASCULAR COAGULATION) PANEL
D-Dimer, Quant: 0.42 ug/mL-FEU (ref 0.00–0.48)
FIBRINOGEN: 214 mg/dL (ref 204–475)
Platelets: 61 10*3/uL — ABNORMAL LOW (ref 150–400)
SMEAR REVIEW: NONE SEEN
aPTT: 32 seconds (ref 24–37)

## 2014-04-13 LAB — BASIC METABOLIC PANEL
Anion gap: 10 (ref 5–15)
BUN: 14 mg/dL (ref 6–23)
CALCIUM: 7.8 mg/dL — AB (ref 8.4–10.5)
CO2: 22 mEq/L (ref 19–32)
Chloride: 96 mEq/L (ref 96–112)
Creatinine, Ser: 1.28 mg/dL (ref 0.50–1.35)
GFR, EST AFRICAN AMERICAN: 67 mL/min — AB (ref >90)
GFR, EST NON AFRICAN AMERICAN: 58 mL/min — AB (ref >90)
Glucose, Bld: 436 mg/dL — ABNORMAL HIGH (ref 70–99)
POTASSIUM: 4 meq/L (ref 3.7–5.3)
Sodium: 128 mEq/L — ABNORMAL LOW (ref 137–147)

## 2014-04-13 LAB — DIC (DISSEMINATED INTRAVASCULAR COAGULATION)PANEL
INR: 1.29 (ref 0.00–1.49)
Prothrombin Time: 16.2 seconds — ABNORMAL HIGH (ref 11.6–15.2)

## 2014-04-13 LAB — HAPTOGLOBIN: HAPTOGLOBIN: 48 mg/dL (ref 45–215)

## 2014-04-13 LAB — LACTATE DEHYDROGENASE: LDH: 288 U/L — ABNORMAL HIGH (ref 94–250)

## 2014-04-13 LAB — SAVE SMEAR

## 2014-04-13 MED ORDER — INSULIN GLARGINE 100 UNIT/ML ~~LOC~~ SOLN
28.0000 [IU] | Freq: Every day | SUBCUTANEOUS | Status: DC
  Administered 2014-04-14: 28 [IU] via SUBCUTANEOUS
  Filled 2014-04-13: qty 0.28

## 2014-04-13 MED ORDER — INSULIN ASPART 100 UNIT/ML ~~LOC~~ SOLN
3.0000 [IU] | Freq: Three times a day (TID) | SUBCUTANEOUS | Status: DC
  Administered 2014-04-13 – 2014-04-14 (×3): 3 [IU] via SUBCUTANEOUS

## 2014-04-13 MED ORDER — LIVING WELL WITH DIABETES BOOK
Freq: Once | Status: DC
  Filled 2014-04-13 (×2): qty 1

## 2014-04-13 MED ORDER — GUAIFENESIN-DM 100-10 MG/5ML PO SYRP
5.0000 mL | ORAL_SOLUTION | ORAL | Status: DC | PRN
  Administered 2014-04-13: 5 mL via ORAL
  Filled 2014-04-13 (×2): qty 5

## 2014-04-13 MED ORDER — PNEUMOCOCCAL VAC POLYVALENT 25 MCG/0.5ML IJ INJ
0.5000 mL | INJECTION | INTRAMUSCULAR | Status: AC
  Administered 2014-04-14: 0.5 mL via INTRAMUSCULAR
  Filled 2014-04-13: qty 0.5

## 2014-04-13 MED ORDER — INSULIN GLARGINE 100 UNIT/ML ~~LOC~~ SOLN
8.0000 [IU] | Freq: Once | SUBCUTANEOUS | Status: AC
  Administered 2014-04-13: 8 [IU] via SUBCUTANEOUS
  Filled 2014-04-13 (×2): qty 0.08

## 2014-04-13 MED ORDER — BENZONATATE 100 MG PO CAPS: 100.0000 mg | ORAL_CAPSULE | Freq: Three times a day (TID) | ORAL | Status: DC | PRN

## 2014-04-13 NOTE — Consult Note (Signed)
[place]  Telephone:(336) [place] NOTE  Efrain Clauson                                MR#: [id number]  DOB: [birth year]                       CSN#: [id number]  Patient Care Team: Pcp Not In System as PCP - General Referring MD: [facility]   I have seen the patient, examined him and edited the notes as follows  Reason for Consult: Thrombocytopenia  Urie Loughner 63 y.o. male admitted with a hyperglycemic event on 11/2. He was found to have abnormal CBC with platelet count of 71,000, initially with unremarkable white count and hemoglobin. The patient denied any chest pain, shortness of breath, or recent bruising or acute bleeding, such as spontaneous epistaxis, hematuria, melena or hematochezia. The patient denies history of liver disease, or risk factors for HIV. Denies exposure to heparin, Lovenox. Denies recent new medications. He was not on aspirin prior to admission, or NSAIDs, but has been placed on aspirin 81 mg daily during this hospitalization.  He denies prior blood or platelet transfusions. Patient never had a hematological evaluation prior to this admission. He never had a bone marrow biopsy. He denies recent infection although prior to admission he had brief episode of diarrhea.  The patient has a history of irregular heart beat and heart murmur currently on cardiac defibrillator and amiodarone.  His platelet count dropped to 59,000 on 11/3 and to 55,000 today. His Hb originally 13, is now 11.1. His white count dropped from 4.9 to 3.9 today. ANC was 3.6. Today's differential not available. His platelet count as of 10/2010 was at 141,000. Creatinine was normal. Urine shows small amount of microscopic blood, with protein negative. Smear has been ordered for review. Abdominal ultrasound ruled out hepatosplenomegaly.  We were kindly asked to see the patient with recommendations  PMH:  Past Medical History  Diagnosis Date  .  Diabetes mellitus without complication   . Coronary artery disease   History of diabetic neuropathy Hypertension History of hypertrophic cardiomyopathy  Prior history of cholelithiasis History of Non Sustained Ventricular tachycardia 2012  Surgeries:  Past Surgical History  Procedure Laterality Date  . Cardiac defibrillator placement    s/p laparoscopic cholecystectomy 2008 s/p partial lobectomy for pulmonary abscess 2002   Allergies:  Allergies  Allergen Reactions  . Amoxicillin     unknown  . Grapefruit Extract     [facility] told him not to eat    Medications:    prior to admission:  Prescriptions prior to admission  Medication Sig Dispense Refill Last Dose  . amLODipine (NORVASC) 10 MG tablet Take 10 mg by mouth daily.   04/11/2014 at Unknown time  . aspirin 81 MG tablet Take 81 mg by mouth daily.   04/11/2014 at Unknown time  . atorvastatin (LIPITOR) 20 MG tablet Take 20 mg by mouth daily.   04/11/2014 at Unknown time  . insulin glargine (LANTUS) 100 UNIT/ML injection Inject 28 Units into the skin at bedtime.   Past Week at Unknown time  . labetalol (NORMODYNE) 100 MG tablet Take 100 mg by mouth 2 (two) times daily.   04/11/2014 at 0800  . levothyroxine (SYNTHROID, LEVOTHROID) 88 MCG tablet Take 88 mcg by mouth daily before breakfast.   04/11/2014 at Unknown time  .  lisinopril (PRINIVIL,ZESTRIL) 40 MG tablet Take 40 mg by mouth daily.   04/11/2014 at Unknown time  . polyvinyl alcohol (LIQUIFILM TEARS) 1.4 % ophthalmic solution Place 1 drop into both eyes 2 (two) times daily as needed (dry eyes).   Past Week at Unknown time  . sildenafil (VIAGRA) 50 MG tablet Take 50 mg by mouth daily as needed for erectile dysfunction.   Past Week at Unknown time  . terazosin (HYTRIN) 2 MG capsule Take 2 mg by mouth at bedtime.   04/11/2014 at Unknown time  . oxyCODONE (ROXICODONE) 5 MG immediate release tablet Take 1 tablet (5 mg total) by mouth every 4 (four) hours as needed for pain. 15  tablet 0   . PRESCRIPTION MEDICATION 4 high blood pressure meds, thyroid medication, cholesterol medication, medication for digestive system - all from [facility]   11/28/2012 at Unknown    . amLODipine  10 mg Oral Daily  . aspirin EC  81 mg Oral Daily  . atorvastatin  20 mg Oral Daily  . gabapentin  300 mg Oral QHS  . Influenza vac split quadrivalent PF  0.5 mL Intramuscular Tomorrow-1000  . insulin aspart  0-15 Units Subcutaneous TID WC  . insulin glargine  20 Units Subcutaneous Daily  . labetalol  100 mg Oral BID  . levothyroxine  88 mcg Oral QAC breakfast  . lisinopril  40 mg Oral Daily  . [START ON 04/14/2014] pneumococcal 23 valent vaccine  0.5 mL Intramuscular Tomorrow-1000  . terazosin  2 mg Oral QHS    AYO:[id number] **OR** acetaminophen, morphine injection, ondansetron **OR** ondansetron (ZOFRAN) IV, oxyCODONE, polyvinyl alcohol, zolpidem  ROS: Constitutional: Denies fevers, chills or abnormal night sweats Eyes: Denies blurriness of vision, double vision or watery eyes Ears, nose, mouth, throat, and face: Denies mucositis or sore throat Respiratory: Denies cough, dyspnea or wheezes Cardiovascular: Denies palpitation, chest discomfort or lower extremity swelling Gastrointestinal:  Denies nausea, heartburn  Skin: Denies abnormal skin rashes or easy bruising. Lymphatics: Denies new lymphadenopathy or easy bruising Neurological:Denies numbness, tingling , but he has chronic diabetic neuropathy with subsequent limited ambulation, using a cane Behavioral/Psych: Mood is stable, no new changes  All other systems were reviewed with the patient and are negative.    Family History:  Mother died with cancer of colon cancer. Father died with cardiac disease. No family history of hematological  disorders.  Social History:  reports that he has never smoked. He does not have any smokeless tobacco history on file. He reports that he uses illicit drugs (Marijuana). He reports that he does  not drink alcohol. Married. 3 daughters in good health.  Lives in [place]. 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   Physical Exam    ECOG PERFORMANCE STATUS: 1  Filed Vitals:   04/13/14 0930  BP: 118/73  Pulse: 71  Temp: 98.6 F (37 C)  Resp: 18   Filed Weights   04/11/14 1945 04/12/14 0102 04/12/14 2043  Weight: 153 lb (69.4 kg) 153 lb (69.4 kg) 153 lb (69.4 kg)    GENERAL:alert, no distress and comfortable SKIN: skin color, texture, turgor are normal, no rashes or significant lesions EYES: normal, conjunctiva are pink and non-injected, sclera clear OROPHARYNX:no exudate, no erythema and lips, buccal mucosa, and tongue normal  NECK: supple, thyroid normal size, non-tender, without nodularity LYMPH:  no palpable lymphadenopathy in the cervical, axillary or inguinal area LUNGS: clear to auscultation and percussion with normal breathing effort HEART:  regular rate & rhythm and 1/6 systolic murmur and no lower extremity edema ABDOMEN:abdomen soft, non-tender and normal bowel sounds Musculoskeletal:no cyanosis of digits and no clubbing  PSYCH: alert & oriented x 3 with fluent speech NEURO: no focal motor/sensory deficits   Labs:    Recent Labs Lab 04/11/14 1946 04/12/14 0656 04/13/14 0543  WBC 4.9 4.6 3.9*  HGB 13.0 12.3* 11.1*  HCT 36.9* 35.8* 32.6*  PLT 71* 59* 55*  MCV 78.3 80.3 80.7  MCH 27.6 27.6 27.5  MCHC 35.2 34.4 34.0  RDW 13.3 13.7 13.8  LYMPHSABS 0.9  --   --   MONOABS 0.4  --   --   EOSABS 0.0  --   --   BASOSABS 0.0  --   --         Recent Labs Lab 04/11/14 1946 04/12/14 0656 04/13/14 0543  NA 124* 135* 128*  K 4.3 3.4* 4.0  CL 87* 99 96  CO2 _0 GLUCOSE 577* 63* 436*  BUN _1 CREATININE 1.35 1.25 1.28  CALCIUM 8.3* 8.0* 7.8*  AST 96*  --   --   ALT 68*  --   --   ALKPHOS 143*  --   --   BILITOT 1.2  --   --         Component Value Date/Time   BILITOT 1.2 04/11/2014 1946    Urinalysis    Component Value Date/Time   COLORURINE YELLOW 04/11/2014 2249   APPEARANCEUR CLEAR 04/11/2014 2249   LABSPEC 1.030 04/11/2014 2249   PHURINE 6.5 04/11/2014 2249   GLUCOSEU >1000* 04/11/2014 2249   HGBUR SMALL* 04/11/2014 2249   BILIRUBINUR NEGATIVE 04/11/2014 2249   KETONESUR 15* 04/11/2014 2249   PROTEINUR NEGATIVE 04/11/2014 2249   UROBILINOGEN 2.0* 04/11/2014 2249   NITRITE NEGATIVE 04/11/2014 2249   LEUKOCYTESUR NEGATIVE 04/11/2014 2249   Imaging Studies:  US Abdomen Complete  04/12/2014   CLINICAL DATA:  Thrombocytopenia. Increased LFTs. History of cholecystectomy.  EXAM: ULTRASOUND ABDOMEN COMPLETE  COMPARISON:  None.  FINDINGS: Gallbladder: Status post cholecystectomy  Common bile duct: Diameter: 5 mm  Liver: No focal lesion identified. Within normal limits in parenchymal echogenicity.  IVC: No abnormality visualized.  Pancreas: Visualized portion unremarkable.  Spleen: Size and appearance within normal limits.  Right Kidney: Length: 10.0 cm. Normal renal cortical thickness and echogenicity. Echogenic 7 mm focus within the inferior pole, likely representing nonobstructing stone.  Left Kidney: Length: 10.9 cm. Echogenicity within normal limits. No mass or hydronephrosis visualized.  Abdominal aorta: No aneurysm visualized.  Other findings: Trace fluid within the abdomen surrounding the liver and spleen.  IMPRESSION: Trace perihepatic and perisplenic fluid.  No hydronephrosis.  Probable 7 mm nonobstructing stone inferior pole right kidney.   Electronically Signed   By: Lovey Newcomer M.D.   On: 04/12/2014 21:08   I reviewed his blood smear. He has normal WBC morphology. Slight hypochromic red blood cells are seen. No schistocytes. No platelet clumping. Platelet count is reduced in number.   A/P: 63 y.o. male with :  Thrombocytopenia: At this point the cause is unknown, likely consumption or reduced production. No bleeding issues are noted.  Abdominal ultrasound showed  no hepatosplenomegaly.  Patient's platelet count was 141k in 2012; will need to obtain records from [place] to evaluate if this low platelet count is acute or chronic. For now will observe as he is not symptomatic. Consider transfusion of platelets only if he has signed of blooding  or less than 10,000   Anemia this may be sue to acute illness and dilution, although microscopic blood noted in urine.  Hb was normal on admission No macroscopic bleeding issues were noted.  Will continue to monitor, with no transfusion indicated at this time.   Discharge planning OK to discharge with platelet count >50,000. I have set up return follow-up in 2 weeks on 11/18. I addressed all questions.  Other medical issues including diabetes, and cardiac abnormalities as per admitting team.     Thank you for the referral.  **Disclaimer: This note was dictated with voice recognition software. Similar sounding words can inadvertently be transcribed and this note may contain transcription errors which may not have been corrected upon publication of note.Sharene Butters E, PA-C 04/13/2014 10:36 AM Isobel Eisenhuth, MD 04/13/2014

## 2014-04-13 NOTE — Progress Notes (Signed)
Pt refuses bed alarm this evening. Educated on the necessity due to history of recent fall. Pt reports someone was "chasing him" when he fell, not because he was unsteady. Dorthey Sawyer, RN

## 2014-04-13 NOTE — Progress Notes (Signed)
Patient ID: Anthony Mcfarland  male  ZOX:[id number]    DOB: [birth year]    DOA: 04/11/2014  PCP: Pcp Not In System  Brief history of present illness  Patient is a 63 year old male, [facility], past medical history significant for diabetes mellitus, insulin-dependent presented with hyperglycemia and lower extremity pain. Patient had not taken his Lantus for last 2 days prior to admission. His blood sugar in the ED was 577, was started on insulin glucose stabilizer. Patient reported his lower extremity pain has been off and on for last 4 months also was noted platelet count was 71,000 with no prior known history of thrombocytopenia.    Assessment/Plan: Principal Problem:   Hyperglycemia, diabetes mellitus uncontrolled - Off insulin glucostabilizer, Lantus increased to 28 units, also started on NovoLog meal coverage, sliding scale insulin - hemoglobin A1c15.7 - Diabetic coordinator consult.  Active Problems:   Thrombocytopenia:  Unclear etiology or chronicity, last platelets in 5/ 2012: 141,000 - ordered LDH, haptoglobin, DIC panel, CBC with differential, smear - no splenomegaly on the abdominal ultrasound - not on any heparin or Lovenox - hematology consult placed, discussed with Dr. Alvy Bimler    DM (diabetes mellitus) type 2, uncontrolled - see #1    Coronary artery disease - No chest pain or shortness of breath  Hypertension - Currently stable  Pseudohyponatremia: corrected sodium133 - Place on gentle hydration  DVT Prophylaxis: SCDs  Code Status:full code  Family Communication:  Disposition:  Consultants:  hematology  Procedures:  none  Antibiotics:  none    Subjective: Patient seen and examined, eating breakfast, complaining about the food otherwise no specific problems  Objective: Weight change: 0 kg (0 lb)  Intake/Output Summary (Last 24 hours) at 04/13/14 1133 Last data filed at 04/13/14 0536  Gross per 24 hour  Intake   1200 ml    Output   1360 ml  Net   -160 ml   Blood pressure 118/73, pulse 71, temperature 98.6 F (37 C), temperature source Oral, resp. rate 18, height 6\' 1"  (1.854 m), weight 69.4 kg (153 lb), SpO2 99 %.  Physical Exam: General: Alert and awake, oriented x3, not in any acute distress. CVS: S1-S2 clear, no murmur rubs or gallops Chest: clear to auscultation bilaterally, no wheezing, rales or rhonchi Abdomen: soft nontender, nondistended, normal bowel sounds  Extremities: no cyanosis, clubbing or edema noted bilaterally   Lab Results: Basic Metabolic Panel:  Recent Labs Lab 04/12/14 0656 04/13/14 0543  NA 135* 128*  K 3.4* 4.0  CL 99 96  CO2 22 22  GLUCOSE 63* 436*  BUN 13 14  CREATININE 1.25 1.28  CALCIUM 8.0* 7.8*   Liver Function Tests:  Recent Labs Lab 04/11/14 1946  AST 96*  ALT 68*  ALKPHOS 143*  BILITOT 1.2  PROT 6.8  ALBUMIN 2.9*   No results for input(s): LIPASE, AMYLASE in the last 168 hours. No results for input(s): AMMONIA in the last 168 hours. CBC:  Recent Labs Lab 04/11/14 1946 04/12/14 0656 04/13/14 0543  WBC 4.9 4.6 3.9*  NEUTROABS 3.6  --   --   HGB 13.0 12.3* 11.1*  HCT 36.9* 35.8* 32.6*  MCV 78.3 80.3 80.7  PLT 71* 59* 55*   Cardiac Enzymes: No results for input(s): CKTOTAL, CKMB, CKMBINDEX, TROPONINI in the last 168 hours. BNP: Invalid input(s): POCBNP CBG:  Recent Labs Lab 04/12/14 1024 04/12/14 1148 04/12/14 1630 04/12/14 2046 04/13/14 0748  GLUCAP 223* 187* 269* 227* 345*  Micro Results: No results found for this or any previous visit (from the past 240 hour(s)).  Studies/Results: US Abdomen Complete  04/12/2014   CLINICAL DATA:  Thrombocytopenia. Increased LFTs. History of cholecystectomy.  EXAM: ULTRASOUND ABDOMEN COMPLETE  COMPARISON:  None.  FINDINGS: Gallbladder: Status post cholecystectomy  Common bile duct: Diameter: 5 mm  Liver: No focal lesion identified. Within normal limits in parenchymal echogenicity.  IVC:  No abnormality visualized.  Pancreas: Visualized portion unremarkable.  Spleen: Size and appearance within normal limits.  Right Kidney: Length: 10.0 cm. Normal renal cortical thickness and echogenicity. Echogenic 7 mm focus within the inferior pole, likely representing nonobstructing stone.  Left Kidney: Length: 10.9 cm. Echogenicity within normal limits. No mass or hydronephrosis visualized.  Abdominal aorta: No aneurysm visualized.  Other findings: Trace fluid within the abdomen surrounding the liver and spleen.  IMPRESSION: Trace perihepatic and perisplenic fluid.  No hydronephrosis.  Probable 7 mm nonobstructing stone inferior pole right kidney.   Electronically Signed   By: Lovey Newcomer M.D.   On: 04/12/2014 21:08    Medications: Scheduled Meds: . amLODipine  10 mg Oral Daily  . aspirin EC  81 mg Oral Daily  . atorvastatin  20 mg Oral Daily  . gabapentin  300 mg Oral QHS  . Influenza vac split quadrivalent PF  0.5 mL Intramuscular Tomorrow-1000  . insulin aspart  0-15 Units Subcutaneous TID WC  . insulin aspart  3 Units Subcutaneous TID WC  . [START ON 04/14/2014] insulin glargine  28 Units Subcutaneous Daily  . insulin glargine  8 Units Subcutaneous Once  . labetalol  100 mg Oral BID  . levothyroxine  88 mcg Oral QAC breakfast  . lisinopril  40 mg Oral Daily  . [START ON 04/14/2014] pneumococcal 23 valent vaccine  0.5 mL Intramuscular Tomorrow-1000  . terazosin  2 mg Oral QHS      LOS: 2 days   RAI,RIPUDEEP M.D. [facility] 04/13/2014, 11:33 AM Pager: [phone]  If 7PM-7AM, please contact night-coverage [email or web address] Password [email or web address]

## 2014-04-13 NOTE — Consult Note (Signed)
Addendum: elevated LFT likely due to fatty liver disease from poorly controlled DM. Monitor closely

## 2014-04-13 NOTE — Progress Notes (Signed)
Inpatient Diabetes Program Recommendations  AACE/ADA: New Consensus Statement on Inpatient Glycemic Control (2013)  Target Ranges:  Prepandial:   less than 140 mg/dL      Peak postprandial:   less than 180 mg/dL (1-2 hours)      Critically ill patients:  140 - 180 mg/dL   Reason for Assessment:  Results for Anthony Mcfarland, Anthony Mcfarland (MRN [id number]) as of 04/13/2014 14:38  Ref. Range 04/12/2014 11:48 04/12/2014 16:30 04/12/2014 20:46 04/13/2014 07:48 04/13/2014 11:58  Glucose-Capillary Latest Range: 70-99 mg/dL 187 (H) 269 (H) 227 (H) 345 (H) 222 (H)   Diabetes history: Type 2 diabetes Outpatient Diabetes medications: Lantus 28 units q HS. Current orders for Inpatient glycemic control:  Novolog moderate tid with meals, Lantus 28 units daily, Novolog 3 units tid with meals  Spoke with patient.  He states that CBG's are usually between 150-180 mg/dL at home.  He admits that he has not been doing as well recently.  He states that he has lost some weight recently.  He is planning to go to the [place] as soon as he gets out of the hospital to follow up regarding his diabetes.  He likely needs Novolog during the day to correct and also cover CHO intake at home.  Note that insulin was adjusted today.  Doubt that he is checking frequently at home based on A1C results.  Discussed importance of follow-up.  Affect was flat.  He states that he does not have further questions at this time.  Will follow.  Thanks, Adah Perl, RN, BC-ADM Inpatient Diabetes Coordinator Pager [phone]

## 2014-04-14 ENCOUNTER — Telehealth: Payer: Self-pay | Admitting: Hematology and Oncology

## 2014-04-14 LAB — BASIC METABOLIC PANEL
ANION GAP: 13 (ref 5–15)
BUN: 12 mg/dL (ref 6–23)
CO2: 22 mEq/L (ref 19–32)
CREATININE: 1.21 mg/dL (ref 0.50–1.35)
Calcium: 7.8 mg/dL — ABNORMAL LOW (ref 8.4–10.5)
Chloride: 100 mEq/L (ref 96–112)
GFR calc non Af Amer: 62 mL/min — ABNORMAL LOW (ref >90)
GFR, EST AFRICAN AMERICAN: 71 mL/min — AB (ref >90)
Glucose, Bld: 228 mg/dL — ABNORMAL HIGH (ref 70–99)
POTASSIUM: 3.7 meq/L (ref 3.7–5.3)
Sodium: 135 mEq/L — ABNORMAL LOW (ref 137–147)

## 2014-04-14 LAB — VITAMIN B12: Vitamin B-12: 1029 pg/mL — ABNORMAL HIGH (ref 211–911)

## 2014-04-14 LAB — HEPATIC FUNCTION PANEL
ALK PHOS: 121 U/L — AB (ref 39–117)
ALT: 63 U/L — ABNORMAL HIGH (ref 0–53)
AST: 107 U/L — ABNORMAL HIGH (ref 0–37)
Albumin: 2.5 g/dL — ABNORMAL LOW (ref 3.5–5.2)
BILIRUBIN INDIRECT: 0.5 mg/dL (ref 0.3–0.9)
BILIRUBIN TOTAL: 1 mg/dL (ref 0.3–1.2)
Bilirubin, Direct: 0.5 mg/dL — ABNORMAL HIGH (ref 0.0–0.3)
TOTAL PROTEIN: 5.9 g/dL — AB (ref 6.0–8.3)

## 2014-04-14 LAB — GLUCOSE, CAPILLARY
Glucose-Capillary: 221 mg/dL — ABNORMAL HIGH (ref 70–99)
Glucose-Capillary: 119 mg/dL — ABNORMAL HIGH (ref 70–99)

## 2014-04-14 LAB — CBC
HCT: 31.4 % — ABNORMAL LOW (ref 39.0–52.0)
Hemoglobin: 11 g/dL — ABNORMAL LOW (ref 13.0–17.0)
MCH: 27.3 pg (ref 26.0–34.0)
MCHC: 35 g/dL (ref 30.0–36.0)
MCV: 77.9 fL — AB (ref 78.0–100.0)
PLATELETS: 55 10*3/uL — AB (ref 150–400)
RBC: 4.03 MIL/uL — AB (ref 4.22–5.81)
RDW: 13.6 % (ref 11.5–15.5)
WBC: 3.1 10*3/uL — AB (ref 4.0–10.5)

## 2014-04-14 MED ORDER — CEFUROXIME AXETIL 500 MG PO TABS
500.0000 mg | ORAL_TABLET | Freq: Once | ORAL | Status: AC
  Administered 2014-04-14: 500 mg via ORAL
  Filled 2014-04-14: qty 1

## 2014-04-14 MED ORDER — FLUTICASONE PROPIONATE 50 MCG/ACT NA SUSP: 1.0000 | Freq: Every day | NASAL | Status: DC

## 2014-04-14 MED ORDER — OXYCODONE HCL 5 MG PO TABS: 5.0000 mg | ORAL_TABLET | ORAL | Status: DC | PRN

## 2014-04-14 MED ORDER — INSULIN GLARGINE 100 UNIT/ML ~~LOC~~ SOLN: 30.0000 [IU] | Freq: Every day | SUBCUTANEOUS | Status: AC

## 2014-04-14 MED ORDER — FLUTICASONE PROPIONATE 50 MCG/ACT NA SUSP
1.0000 | Freq: Every day | NASAL | Status: DC
  Administered 2014-04-14: 1 via NASAL
  Filled 2014-04-14: qty 16

## 2014-04-14 MED ORDER — BENZONATATE 100 MG PO CAPS: 100.0000 mg | ORAL_CAPSULE | Freq: Three times a day (TID) | ORAL | Status: DC | PRN

## 2014-04-14 MED ORDER — LORATADINE 10 MG PO TABS
10.0000 mg | ORAL_TABLET | Freq: Every day | ORAL | Status: DC
  Administered 2014-04-14: 10 mg via ORAL
  Filled 2014-04-14: qty 1

## 2014-04-14 MED ORDER — LORATADINE 10 MG PO TABS: 10.0000 mg | ORAL_TABLET | Freq: Every day | ORAL | Status: DC

## 2014-04-14 MED ORDER — INSULIN ASPART 100 UNIT/ML ~~LOC~~ SOLN: 5.0000 [IU] | Freq: Three times a day (TID) | SUBCUTANEOUS | Status: DC

## 2014-04-14 MED ORDER — CEFUROXIME AXETIL 500 MG PO TABS: 500.0000 mg | ORAL_TABLET | Freq: Two times a day (BID) | ORAL | Status: DC

## 2014-04-14 MED ORDER — GABAPENTIN 300 MG PO CAPS: 300.0000 mg | ORAL_CAPSULE | Freq: Every day | ORAL | Status: AC

## 2014-04-14 MED ORDER — INSULIN GLARGINE 100 UNIT/ML ~~LOC~~ SOLN: 30.0000 [IU] | Freq: Every day | SUBCUTANEOUS | Status: DC

## 2014-04-14 MED ORDER — GUAIFENESIN-CODEINE 100-10 MG/5ML PO SOLN: 5.0000 mL | Freq: Four times a day (QID) | ORAL | Status: DC | PRN

## 2014-04-14 NOTE — Evaluation (Signed)
Physical Therapy Evaluation and Discharge Patient Details Name: Anthony Mcfarland MRN: [id number] DOB: [birth year] Today's Date: 04/14/2014   History of Present Illness  Patient is a 63 year old male, [facility], past medical history significant for diabetes mellitus, insulin-dependent presented with hyperglycemia and lower extremity pain. Patient had not taken his Lantus for last 2 days prior to admission. His blood sugar in the ED was 577, was started on insulin glucose stabilizer. Patient reported his lower extremity pain has been off and on for last 4 months also was noted platelet count was 71,000 with no prior known history of thrombocytopenia.  Clinical Impression  Patient evaluated by Physical Therapy with no further acute PT needs identified. All education has been completed and the patient has no further questions. At the time of PT eval pt was able to perform transfers and ambulation with mod I or supervision. Pt complaining of LE pain that has been present for months. Upon inspection of area pt pointed out, pt had what appeared to be a muscular "knot" in the gluteus medius. After 8 minutes of hands-on soft tissue work, pt reports almost no pain, even with ambulation and weight bearing which was previously painful for him. Instructed pt/daughter in home maintenance with a tennis ball and stretching. See below for any follow-up Physial Therapy or equipment needs. PT is signing off. Thank you for this referral.      Follow Up Recommendations Outpatient PT;Supervision - Intermittent    Equipment Recommendations  Other (comment) (Tub bench)    Recommendations for Other Services       Precautions / Restrictions Precautions Precautions: Fall Restrictions Weight Bearing Restrictions: No      Mobility  Bed Mobility Overal bed mobility: Modified Independent             General bed mobility comments: Pt was able to transition to/from EOB with mod I, and roll  independently.   Transfers Overall transfer level: Modified independent Equipment used: Straight cane             General transfer comment: No physical assistance required. Very minor unsteadiness noted and pt able to use his cane to correct.   Ambulation/Gait Ambulation/Gait assistance: Supervision Ambulation Distance (Feet): 400 Feet Assistive device: Straight cane Gait Pattern/deviations: Step-through pattern;Decreased stride length;Decreased stance time - left;Trunk flexed Gait velocity: Decreased Gait velocity interpretation: Below normal speed for age/gender General Gait Details: Pt reports no SOB or fatigue at end of gait training. Minimal unsteadiness noted and pt able to correct with use of cane. VC's for correct hand positioning on the cane.   Stairs            Wheelchair Mobility    Modified Rankin (Stroke Patients Only)       Balance Overall balance assessment: Needs assistance Sitting-balance support: Feet supported;No upper extremity supported Sitting balance-Leahy Scale: Good     Standing balance support: Single extremity supported;During functional activity Standing balance-Leahy Scale: Fair                               Pertinent Vitals/Pain Pain Assessment: No/denies pain    Home Living Family/patient expects to be discharged to:: Private residence Living Arrangements: Children Available Help at Discharge: Family;Available PRN/intermittently Type of Home: House Home Access: Stairs to enter Entrance Stairs-Rails: Psychiatric nurse of Steps: 8 Home Layout: One level Home Equipment: Cane - single point Additional Comments: Daughter asking about a tub bench  Prior Function Level of Independence: Needs assistance   Gait / Transfers Assistance Needed: Uses the cane all the time  ADL's / Homemaking Assistance Needed: No assist with ADL's - daughter concerned about him falling trying to get in and out of the tub  shower        Hand Dominance   Dominant Hand: Right    Extremity/Trunk Assessment   Upper Extremity Assessment: Defer to OT evaluation           Lower Extremity Assessment: LLE deficits/detail   LLE Deficits / Details: Pt reports pain down the lateral side of his leg as well as up in the gluteal region on the left side.   Cervical / Trunk Assessment: Normal  Communication   Communication: No difficulties  Cognition Arousal/Alertness: Awake/alert Behavior During Therapy: WFL for tasks assessed/performed Overall Cognitive Status: Within Functional Limits for tasks assessed                      General Comments General comments (skin integrity, edema, etc.): Provided written HEP and discussed with pt/daughter    Exercises        Assessment/Plan    PT Assessment Patent does not need any further PT services  PT Diagnosis Difficulty walking;Acute pain   PT Problem List    PT Treatment Interventions     PT Goals (Current goals can be found in the Care Plan section) Acute Rehab PT Goals PT Goal Formulation: All assessment and education complete, DC therapy    Frequency     Barriers to discharge        Co-evaluation               End of Session Equipment Utilized During Treatment: Gait belt Activity Tolerance: Patient tolerated treatment well Patient left: in bed;with call bell/phone within reach;with family/visitor present Nurse Communication: Mobility status         Time: 9163-8466 PT Time Calculation (min): 42 min   Charges:   PT Evaluation $Initial PT Evaluation Tier I: 1 Procedure PT Treatments $Gait Training: 8-22 mins $Therapeutic Activity: 8-22 mins $Self Care/Home Management: 8-22   PT G Codes:          Rolinda Roan 04/14/2014, 1:53 PM   Rolinda Roan, PT, DPT Acute Rehabilitation Services Pager: [phone]

## 2014-04-14 NOTE — Telephone Encounter (Signed)
PATIENT SCHEDULED TO SEE DR. Alvy Bimler 11/18 @ 9:45 W/DR. [place].

## 2014-04-14 NOTE — Progress Notes (Signed)
Pt discharged home. Discharge instructions given to pt and daughter, both verbalized signs and symptoms of worsening condition and when to call the doctor. Prescriptions given to daughter.  Penni Bombard, RN 2:39 PM 04/14/2014

## 2014-04-14 NOTE — Discharge Summary (Signed)
Physician Discharge Summary  Patient ID: Anthony Mcfarland MRN: [id number] DOB/AGE: 1950-09-06 63 y.o.  Admit date: 04/11/2014 Discharge date: 04/14/2014  Primary Care Physician:  Pcp Not In System  Discharge Diagnoses:    . Hyperglycemia with uncontrolled diabetes . Thrombocytopenia . DM (diabetes mellitus) type 2, uncontrolled, with ketoacidosis . Coronary artery disease  Consults:  Hematology, Dr. Alvy Bimler  Recommendations for Outpatient Follow-up:  Patient has a follow-up appointment with Dr. Alvy Bimler on 11/18 at 9:45 AMto follow-up on thrombocytopenia  Patient was recommended to follow up with [facility], possibly enroll in diabetic classes for education, insulin teaching at [place]:   Allergies  Allergen Reactions  . Amoxicillin     unknown  . Grapefruit Extract     [facility] told him not to eat     Discharge Medications:   Medication List    TAKE these medications        amLODipine 10 MG tablet  Commonly known as:  NORVASC  Take 10 mg by mouth daily.     aspirin 81 MG tablet  Take 81 mg by mouth daily.     atorvastatin 20 MG tablet  Commonly known as:  LIPITOR  Take 20 mg by mouth daily.     benzonatate 100 MG capsule  Commonly known as:  TESSALON  Take 1 capsule (100 mg total) by mouth 3 (three) times daily as needed for cough.     cefUROXime 500 MG tablet  Commonly known as:  CEFTIN  Take 1 tablet (500 mg total) by mouth 2 (two) times daily with a meal. X 1 week     fluticasone 50 MCG/ACT nasal spray  Commonly known as:  FLONASE  Place 1 spray into both nostrils daily.     gabapentin 300 MG capsule  Commonly known as:  NEURONTIN  Take 1 capsule (300 mg total) by mouth at bedtime.     guaiFENesin-codeine 100-10 MG/5ML syrup  Take 5 mLs by mouth every 6 (six) hours as needed for cough.     insulin aspart 100 UNIT/ML injection  Commonly known as:  novoLOG  Inject 5 Units into the skin 3 (three) times daily with meals.     insulin glargine  100 UNIT/ML injection  Commonly known as:  LANTUS  Inject 0.3 mLs (30 Units total) into the skin at bedtime.     labetalol 100 MG tablet  Commonly known as:  NORMODYNE  Take 100 mg by mouth 2 (two) times daily.     levothyroxine 88 MCG tablet  Commonly known as:  SYNTHROID, LEVOTHROID  Take 88 mcg by mouth daily before breakfast.     lisinopril 40 MG tablet  Commonly known as:  PRINIVIL,ZESTRIL  Take 40 mg by mouth daily.     loratadine 10 MG tablet  Commonly known as:  CLARITIN  Take 1 tablet (10 mg total) by mouth daily.     oxyCODONE 5 MG immediate release tablet  Commonly known as:  ROXICODONE  Take 1 tablet (5 mg total) by mouth every 4 (four) hours as needed.     polyvinyl alcohol 1.4 % ophthalmic solution  Commonly known as:  LIQUIFILM TEARS  Place 1 drop into both eyes 2 (two) times daily as needed (dry eyes).     PRESCRIPTION MEDICATION  4 high blood pressure meds, thyroid medication, cholesterol medication, medication for digestive system - all from [facility]     sildenafil 50 MG tablet  Commonly known as:  VIAGRA  Take 50  mg by mouth daily as needed for erectile dysfunction.     terazosin 2 MG capsule  Commonly known as:  HYTRIN  Take 2 mg by mouth at bedtime.         Brief H and P: For complete details please refer to admission H and P, but in brief patient is a 63 year old male follows at the [place] with Dr. Georgiana Spinner, past medical history significant for diabetes mellitus presented with hyperglycemia and lower extremity pain patient reported that he had not taken his Lantus for the last 2 days. He otherwise denied any nausea, vomiting, abdominal pain or diarrhea. Patient's blood sugar in the emergency room was 577 with slight elevated Although pH was normal. Patient was placed on insulin drip and was admitted. Pain in the lower extremity started 4 months ago and had been on and off. Also noted that his platelet count was 71,000 and he had no prior history of  thrombocytopenia.  Hospital Course:   Hyperglycemia, diabetes mellitus uncontrolled Patient was placed on insulin glucose stabilizer at the time of admission, transition to subcutaneous insulin once blood sugars were better controlled. Diabetic coordinator consult was obtained. Patient was provided with the diabetic teaching. He was placed on Lantus, increase to 30 units daily. He definitely needs NovoLog meal coverage and was placed on 5 units 3 times a day with meals to simplify the insulin regimen. Patient's daughter works during the day and patient possibly will likely not comply with the meal coverage and correction factor insulin.  Thrombocytopenia: Unclear etiology or chronicity, last platelets in 10/2010 was 141,000 LDH 288, DIC panel were negative,INR 1.2 abdominal ultrasound negative for any cirrhosis or splenomegaly Hematology was consulted and patient was seen by Dr. Alvy Bimler, recommended observation and outpatient follow-up on 11/18.records were requested from the hospital for chronicity of the thrombocytopenia.    Day of Discharge BP 159/83 mmHg  Pulse 67  Temp(Src) 98.3 F (36.8 C) (Oral)  Resp 18  Ht 6\' 1"  (1.854 m)  Wt 69.4 kg (153 lb)  BMI 20.19 kg/m2  SpO2 100%  Physical Exam: General: Alert and awake oriented x3 not in any acute distress. HEENT: anicteric sclera, pupils reactive to light and accommodation CVS: S1-S2 clear no murmur rubs or gallops Chest: clear to auscultation bilaterally, no wheezing rales or rhonchi Abdomen: soft nontender, nondistended, normal bowel sounds Extremities: no cyanosis, clubbing or edema noted bilaterally Neuro: Cranial nerves II-XII intact, no focal neurological deficits   The results of significant diagnostics from this hospitalization (including imaging, microbiology, ancillary and laboratory) are listed below for reference.    LAB RESULTS: Basic Metabolic Panel:  Recent Labs Lab 04/13/14 0543 04/14/14 0607  NA 128* 135*   K 4.0 3.7  CL 96 100  CO2 22 22  GLUCOSE 436* 228*  BUN 14 12  CREATININE 1.28 1.21  CALCIUM 7.8* 7.8*   Liver Function Tests:  Recent Labs Lab 04/11/14 1946 04/14/14 0607  AST 96* 107*  ALT 68* 63*  ALKPHOS 143* 121*  BILITOT 1.2 1.0  PROT 6.8 5.9*  ALBUMIN 2.9* 2.5*   No results for input(s): LIPASE, AMYLASE in the last 168 hours. No results for input(s): AMMONIA in the last 168 hours. CBC:  Recent Labs Lab 04/13/14 1046 04/14/14 0607  WBC 3.7* 3.1*  NEUTROABS 2.4  --   HGB 11.3* 11.0*  HCT 32.4* 31.4*  MCV 78.5 77.9*  PLT 61*  61* 55*   Cardiac Enzymes: No results for input(s): CKTOTAL, CKMB, CKMBINDEX, TROPONINI in  the last 168 hours. BNP: Invalid input(s): POCBNP CBG:  Recent Labs Lab 04/14/14 0759 04/14/14 1152  GLUCAP 221* 119*    Significant Diagnostic Studies:  US Abdomen Complete  04/12/2014   CLINICAL DATA:  Thrombocytopenia. Increased LFTs. History of cholecystectomy.  EXAM: ULTRASOUND ABDOMEN COMPLETE  COMPARISON:  None.  FINDINGS: Gallbladder: Status post cholecystectomy  Common bile duct: Diameter: 5 mm  Liver: No focal lesion identified. Within normal limits in parenchymal echogenicity.  IVC: No abnormality visualized.  Pancreas: Visualized portion unremarkable.  Spleen: Size and appearance within normal limits.  Right Kidney: Length: 10.0 cm. Normal renal cortical thickness and echogenicity. Echogenic 7 mm focus within the inferior pole, likely representing nonobstructing stone.  Left Kidney: Length: 10.9 cm. Echogenicity within normal limits. No mass or hydronephrosis visualized.  Abdominal aorta: No aneurysm visualized.  Other findings: Trace fluid within the abdomen surrounding the liver and spleen.  IMPRESSION: Trace perihepatic and perisplenic fluid.  No hydronephrosis.  Probable 7 mm nonobstructing stone inferior pole right kidney.   Electronically Signed   By: Lovey Newcomer M.D.   On: 04/12/2014 21:08       Disposition and Follow-up:      Discharge Instructions    Diet Carb Modified    Complete by:  As directed      Discharge instructions    Complete by:  As directed   It is VERY IMPORTANT that you follow up with a PCP on a regular basis.  Check your blood glucoses before each meal and at bedtime and maintain a log of your readings.  Bring this log with you when you follow up with your PCP so that he or she can adjust your insulin at your follow up visit.     Increase activity slowly    Complete by:  As directed             DISPOSITION:home  DIET: Carb modified   DISCHARGE FOLLOW-UP Follow-up Information    Schedule an appointment as soon as possible for a visit in 10 days to follow up.   Why:  for hospital follow-up   Contact information:   please follow-up with PCP at [facility] in 10-12 days       Follow up with [facility], [place], MD On 04/27/2014.   Specialty:  Hematology and Oncology   Why:  at 9:45AM, for hospital follow-up   Contact information:   [place] [address] [phone]       Time spent on Discharge: 40 mins  Signed:   Rache Klimaszewski M.D. [facility] 04/14/2014, 12:35 PM Pager: [phone]

## 2014-04-27 ENCOUNTER — Inpatient Hospital Stay: Payer: Non-veteran care | Admitting: Hematology and Oncology

## 2014-04-27 ENCOUNTER — Other Ambulatory Visit: Payer: Non-veteran care

## 2014-04-27 ENCOUNTER — Ambulatory Visit: Payer: Non-veteran care

## 2016-06-10 DIAGNOSIS — Z950 Presence of cardiac pacemaker: Secondary | ICD-10-CM

## 2016-06-10 HISTORY — DX: Presence of cardiac pacemaker: Z95.0

## 2016-06-10 HISTORY — PX: INSERT / REPLACE / REMOVE PACEMAKER: SUR710

## 2016-06-10 HISTORY — PX: OTHER SURGICAL HISTORY: SHX169

## 2016-07-17 ENCOUNTER — Emergency Department (HOSPITAL_COMMUNITY): Payer: Medicare Other

## 2016-07-17 ENCOUNTER — Inpatient Hospital Stay (HOSPITAL_COMMUNITY)
Admission: EM | Admit: 2016-07-17 | Discharge: 2016-07-22 | DRG: 386 | Disposition: A | Discharge status: Home / Self Care | Payer: Medicare Other | Attending: Internal Medicine | Admitting: Internal Medicine

## 2016-07-17 ENCOUNTER — Encounter (HOSPITAL_COMMUNITY): Payer: Self-pay | Admitting: Emergency Medicine

## 2016-07-17 DIAGNOSIS — I4892 Unspecified atrial flutter: Secondary | ICD-10-CM | POA: Diagnosis present

## 2016-07-17 DIAGNOSIS — E039 Hypothyroidism, unspecified: Secondary | ICD-10-CM | POA: Diagnosis present

## 2016-07-17 DIAGNOSIS — R55 Syncope and collapse: Secondary | ICD-10-CM

## 2016-07-17 DIAGNOSIS — K922 Gastrointestinal hemorrhage, unspecified: Secondary | ICD-10-CM

## 2016-07-17 DIAGNOSIS — Z7902 Long term (current) use of antithrombotics/antiplatelets: Secondary | ICD-10-CM

## 2016-07-17 DIAGNOSIS — D638 Anemia in other chronic diseases classified elsewhere: Secondary | ICD-10-CM | POA: Diagnosis present

## 2016-07-17 DIAGNOSIS — I4891 Unspecified atrial fibrillation: Secondary | ICD-10-CM | POA: Diagnosis present

## 2016-07-17 DIAGNOSIS — I248 Other forms of acute ischemic heart disease: Secondary | ICD-10-CM | POA: Diagnosis present

## 2016-07-17 DIAGNOSIS — D123 Benign neoplasm of transverse colon: Secondary | ICD-10-CM | POA: Diagnosis present

## 2016-07-17 DIAGNOSIS — R7989 Other specified abnormal findings of blood chemistry: Secondary | ICD-10-CM | POA: Diagnosis present

## 2016-07-17 DIAGNOSIS — N179 Acute kidney failure, unspecified: Secondary | ICD-10-CM | POA: Diagnosis present

## 2016-07-17 DIAGNOSIS — R160 Hepatomegaly, not elsewhere classified: Secondary | ICD-10-CM

## 2016-07-17 DIAGNOSIS — I421 Obstructive hypertrophic cardiomyopathy: Secondary | ICD-10-CM | POA: Diagnosis present

## 2016-07-17 DIAGNOSIS — I679 Cerebrovascular disease, unspecified: Secondary | ICD-10-CM | POA: Diagnosis present

## 2016-07-17 DIAGNOSIS — I472 Ventricular tachycardia: Secondary | ICD-10-CM | POA: Diagnosis present

## 2016-07-17 DIAGNOSIS — Z7901 Long term (current) use of anticoagulants: Secondary | ICD-10-CM

## 2016-07-17 DIAGNOSIS — R195 Other fecal abnormalities: Secondary | ICD-10-CM | POA: Diagnosis present

## 2016-07-17 DIAGNOSIS — K2990 Gastroduodenitis, unspecified, without bleeding: Secondary | ICD-10-CM | POA: Diagnosis present

## 2016-07-17 DIAGNOSIS — I252 Old myocardial infarction: Secondary | ICD-10-CM

## 2016-07-17 DIAGNOSIS — K559 Vascular disorder of intestine, unspecified: Secondary | ICD-10-CM | POA: Diagnosis present

## 2016-07-17 DIAGNOSIS — D696 Thrombocytopenia, unspecified: Secondary | ICD-10-CM | POA: Diagnosis present

## 2016-07-17 DIAGNOSIS — F1290 Cannabis use, unspecified, uncomplicated: Secondary | ICD-10-CM | POA: Diagnosis present

## 2016-07-17 DIAGNOSIS — D649 Anemia, unspecified: Secondary | ICD-10-CM

## 2016-07-17 DIAGNOSIS — K51011 Ulcerative (chronic) pancolitis with rectal bleeding: Secondary | ICD-10-CM | POA: Diagnosis not present

## 2016-07-17 DIAGNOSIS — K635 Polyp of colon: Secondary | ICD-10-CM

## 2016-07-17 DIAGNOSIS — Z8619 Personal history of other infectious and parasitic diseases: Secondary | ICD-10-CM

## 2016-07-17 DIAGNOSIS — I959 Hypotension, unspecified: Secondary | ICD-10-CM | POA: Diagnosis present

## 2016-07-17 DIAGNOSIS — Z8673 Personal history of transient ischemic attack (TIA), and cerebral infarction without residual deficits: Secondary | ICD-10-CM

## 2016-07-17 DIAGNOSIS — Z9049 Acquired absence of other specified parts of digestive tract: Secondary | ICD-10-CM

## 2016-07-17 DIAGNOSIS — I129 Hypertensive chronic kidney disease with stage 1 through stage 4 chronic kidney disease, or unspecified chronic kidney disease: Secondary | ICD-10-CM | POA: Diagnosis present

## 2016-07-17 DIAGNOSIS — Z95810 Presence of automatic (implantable) cardiac defibrillator: Secondary | ICD-10-CM

## 2016-07-17 DIAGNOSIS — K2970 Gastritis, unspecified, without bleeding: Secondary | ICD-10-CM | POA: Diagnosis present

## 2016-07-17 DIAGNOSIS — D62 Acute posthemorrhagic anemia: Secondary | ICD-10-CM | POA: Diagnosis present

## 2016-07-17 DIAGNOSIS — K7460 Unspecified cirrhosis of liver: Secondary | ICD-10-CM | POA: Diagnosis present

## 2016-07-17 DIAGNOSIS — I2510 Atherosclerotic heart disease of native coronary artery without angina pectoris: Secondary | ICD-10-CM | POA: Diagnosis present

## 2016-07-17 DIAGNOSIS — Z79899 Other long term (current) drug therapy: Secondary | ICD-10-CM

## 2016-07-17 DIAGNOSIS — Z880 Allergy status to penicillin: Secondary | ICD-10-CM

## 2016-07-17 DIAGNOSIS — N183 Chronic kidney disease, stage 3 (moderate): Secondary | ICD-10-CM | POA: Diagnosis present

## 2016-07-17 DIAGNOSIS — E1122 Type 2 diabetes mellitus with diabetic chronic kidney disease: Secondary | ICD-10-CM | POA: Diagnosis present

## 2016-07-17 DIAGNOSIS — Z794 Long term (current) use of insulin: Secondary | ICD-10-CM

## 2016-07-17 DIAGNOSIS — R188 Other ascites: Secondary | ICD-10-CM

## 2016-07-17 DIAGNOSIS — Z91018 Allergy to other foods: Secondary | ICD-10-CM

## 2016-07-17 HISTORY — DX: Unspecified viral hepatitis C without hepatic coma: B19.20

## 2016-07-17 HISTORY — DX: Unspecified atrial flutter: I48.92

## 2016-07-17 LAB — COMPREHENSIVE METABOLIC PANEL
ALK PHOS: 41 U/L (ref 38–126)
ALT: 15 U/L — AB (ref 17–63)
AST: 20 U/L (ref 15–41)
Albumin: 3 g/dL — ABNORMAL LOW (ref 3.5–5.0)
Anion gap: 9 (ref 5–15)
BUN: 22 mg/dL — ABNORMAL HIGH (ref 6–20)
CO2: 20 mmol/L — ABNORMAL LOW (ref 22–32)
Calcium: 8.8 mg/dL — ABNORMAL LOW (ref 8.9–10.3)
Chloride: 108 mmol/L (ref 101–111)
Creatinine, Ser: 1.81 mg/dL — ABNORMAL HIGH (ref 0.61–1.24)
GFR, EST AFRICAN AMERICAN: 43 mL/min — AB (ref >60)
GFR, EST NON AFRICAN AMERICAN: 37 mL/min — AB (ref >60)
Glucose, Bld: 262 mg/dL — ABNORMAL HIGH (ref 65–99)
Potassium: 3.9 mmol/L (ref 3.5–5.1)
Sodium: 137 mmol/L (ref 135–145)
Total Bilirubin: 0.7 mg/dL (ref 0.3–1.2)
Total Protein: 5.9 g/dL — ABNORMAL LOW (ref 6.5–8.1)

## 2016-07-17 LAB — CBC WITH DIFFERENTIAL/PLATELET
Basophils Absolute: 0 10*3/uL (ref 0.0–0.1)
Basophils Relative: 0 %
EOS PCT: 1 %
Eosinophils Absolute: 0.1 10*3/uL (ref 0.0–0.7)
HCT: 19 % — ABNORMAL LOW (ref 39.0–52.0)
Hemoglobin: 6.2 g/dL — CL (ref 13.0–17.0)
LYMPHS ABS: 2.2 10*3/uL (ref 0.7–4.0)
Lymphocytes Relative: 22 %
MCH: 26.2 pg (ref 26.0–34.0)
MCHC: 32.6 g/dL (ref 30.0–36.0)
MCV: 80.2 fL (ref 78.0–100.0)
Monocytes Absolute: 0.8 10*3/uL (ref 0.1–1.0)
Monocytes Relative: 7 %
Neutro Abs: 7 10*3/uL (ref 1.7–7.7)
Neutrophils Relative %: 70 %
PLATELETS: 112 10*3/uL — AB (ref 150–400)
RBC: 2.37 MIL/uL — AB (ref 4.22–5.81)
RDW: 15.5 % (ref 11.5–15.5)
WBC: 10.1 10*3/uL (ref 4.0–10.5)

## 2016-07-17 LAB — PROTIME-INR
INR: 2.11
Prothrombin Time: 24 seconds — ABNORMAL HIGH (ref 11.4–15.2)

## 2016-07-17 LAB — PREPARE RBC (CROSSMATCH)

## 2016-07-17 LAB — I-STAT TROPONIN, ED: Troponin i, poc: 0.94 ng/mL (ref 0.00–0.08)

## 2016-07-17 LAB — I-STAT CG4 LACTIC ACID, ED: LACTIC ACID, VENOUS: 3.18 mmol/L — AB (ref 0.5–1.9)

## 2016-07-17 LAB — MAGNESIUM: MAGNESIUM: 1.6 mg/dL — AB (ref 1.7–2.4)

## 2016-07-17 LAB — CBG MONITORING, ED: GLUCOSE-CAPILLARY: 247 mg/dL — AB (ref 65–99)

## 2016-07-17 LAB — LIPASE, BLOOD: LIPASE: 13 U/L (ref 11–51)

## 2016-07-17 MED ORDER — SODIUM CHLORIDE 0.9 % IV BOLUS (SEPSIS)
1000.0000 mL | Freq: Once | INTRAVENOUS | Status: AC
  Administered 2016-07-18: 1000 mL via INTRAVENOUS

## 2016-07-17 MED ORDER — SODIUM CHLORIDE 0.9 % IV SOLN
Freq: Once | INTRAVENOUS | Status: AC
  Administered 2016-07-18: 01:00:00 via INTRAVENOUS

## 2016-07-17 MED ORDER — PROTHROMBIN COMPLEX CONC HUMAN 500 UNITS IV KIT
3759.0000 [IU] | PACK | Status: AC
  Administered 2016-07-18: 3759 [IU] via INTRAVENOUS
  Filled 2016-07-17: qty 150

## 2016-07-17 NOTE — ED Provider Notes (Signed)
[name] Provider Note   CSN: [id number] Arrival date & time: 07/17/16  2203    History   Chief Complaint Chief Complaint  Patient presents with  . GI Bleeding  . Hypotension  . Altered Mental Status    HPI Anthony Mcfarland is a 66 y.o. male With a past medical history significant for diabetes, hepatitis, atrial fibrillation/flutter s/p recent ablation with Defibrillator and Eliquis use, CAD with recent MI s/p Plavix initiation who presents with syncope, rectal bleed, hypotension, abdominal pain, and severe fatigue. Patient is accompanied by family who reported that he was discharged From the [place] where he gets his care within the last two weeks for MI. They report he started Plavix with his Eliquis at that time. Family reports that over the last few days, he has begun to be more tired, fatigue, and sleepy. He also began having some abdominal cramping. This evening, patient went to the bathroom and had a syncopal event on the toilet. Patient found to have blood in his bowel movement and also vomited red material. Family is unsure If it was red juice or blood in the emesis.   According to EMS, patient had a blood pressure in the low [id number] systolic on arrival. Patient was unresponsive for them. They started some fluids and laid him flat and he regained consciousness.  Patient is denying chest pain, palpitations, shortness of breath, nausea, or headache. He denies focal neurologic complaints. He does report improvement in his abdominal pain.  HPI  Past Medical History:  Diagnosis Date  . Anemia in chronic illness 04/13/2014  . Atrial flutter [facility])    s/p ablation 03/2016 and 06/2016 at [facility].    Marland Kitchen Coronary artery disease   . Diabetes mellitus without complication ([place])   . Hepatitis C    This was treated in late 2015 with anti-viral medications overseen at [facility].    Patient Active Problem List   Diagnosis Date Noted  . GI bleed 07/18/2016  . Thrombocytopenia ([place])  04/13/2014  . DM (diabetes mellitus) type 2, uncontrolled, with ketoacidosis ([place]) 04/13/2014  . Anemia in chronic illness 04/13/2014  . Coronary artery disease   . Hyperglycemia 04/12/2014    Past Surgical History:  Procedure Laterality Date  . CARDIAC DEFIBRILLATOR PLACEMENT    . CHOLECYSTECTOMY    . right leg ankle surgery         Home Medications    Prior to Admission medications   Medication Sig Start Date End Date Taking? Authorizing Provider  amLODipine (NORVASC) 10 MG tablet Take 10 mg by mouth daily.    Historical Provider, MD  aspirin 81 MG tablet Take 81 mg by mouth daily.    Historical Provider, MD  atorvastatin (LIPITOR) 20 MG tablet Take 20 mg by mouth daily.    Historical Provider, MD  benzonatate (TESSALON) 100 MG capsule Take 1 capsule (100 mg total) by mouth 3 (three) times daily as needed for cough. 04/14/14   Ripudeep Krystal Eaton, MD  cefUROXime (CEFTIN) 500 MG tablet Take 1 tablet (500 mg total) by mouth 2 (two) times daily with a meal. X 1 week 04/14/14   Ripudeep Krystal Eaton, MD  fluticasone (FLONASE) 50 MCG/ACT nasal spray Place 1 spray into both nostrils daily. 04/14/14   Ripudeep Krystal Eaton, MD  gabapentin (NEURONTIN) 300 MG capsule Take 1 capsule (300 mg total) by mouth at bedtime. 04/14/14   Ripudeep Krystal Eaton, MD  guaiFENesin-codeine 100-10 MG/5ML syrup Take 5 mLs by mouth every 6 (six)  hours as needed for cough. 04/14/14   Ripudeep Krystal Eaton, MD  insulin aspart (NOVOLOG) 100 UNIT/ML injection Inject 5 Units into the skin 3 (three) times daily with meals. 04/14/14   Ripudeep Krystal Eaton, MD  insulin glargine (LANTUS) 100 UNIT/ML injection Inject 0.3 mLs (30 Units total) into the skin at bedtime. 04/14/14   Ripudeep Krystal Eaton, MD  labetalol (NORMODYNE) 100 MG tablet Take 100 mg by mouth 2 (two) times daily.    Historical Provider, MD  levothyroxine (SYNTHROID, LEVOTHROID) 88 MCG tablet Take 88 mcg by mouth daily before breakfast.    Historical Provider, MD  lisinopril (PRINIVIL,ZESTRIL) 40 MG  tablet Take 40 mg by mouth daily.    Historical Provider, MD  loratadine (CLARITIN) 10 MG tablet Take 1 tablet (10 mg total) by mouth daily. 04/14/14   Ripudeep Krystal Eaton, MD  oxyCODONE (ROXICODONE) 5 MG immediate release tablet Take 1 tablet (5 mg total) by mouth every 4 (four) hours as needed. 04/14/14   Ripudeep Krystal Eaton, MD  polyvinyl alcohol (LIQUIFILM TEARS) 1.4 % ophthalmic solution Place 1 drop into both eyes 2 (two) times daily as needed (dry eyes).    Historical Provider, MD  PRESCRIPTION MEDICATION 4 high blood pressure meds, thyroid medication, cholesterol medication, medication for digestive system - all from [place] Provider, MD  sildenafil (VIAGRA) 50 MG tablet Take 50 mg by mouth daily as needed for erectile dysfunction.    Historical Provider, MD  terazosin (HYTRIN) 2 MG capsule Take 2 mg by mouth at bedtime.    Historical Provider, MD    Family History Family History  Problem Relation Age of Onset  . Cancer Mother     colon cancer    Social History Social History  Substance Use Topics  . Smoking status: Never Smoker  . Smokeless tobacco: Never Used  . Alcohol use No     Allergies   Amoxicillin and Grapefruit extract   Review of Systems Review of Systems  Constitutional: Positive for fatigue. Negative for activity change, chills, diaphoresis and fever.  HENT: Negative for congestion and rhinorrhea.   Eyes: Negative for visual disturbance.  Respiratory: Negative for cough, chest tightness, shortness of breath, wheezing and stridor.   Cardiovascular: Negative for chest pain, palpitations and leg swelling.  Gastrointestinal: Positive for abdominal pain, blood in stool, nausea and vomiting. Negative for abdominal distention, constipation and diarrhea.  Genitourinary: Negative for difficulty urinating, dysuria and flank pain.  Musculoskeletal: Negative for back pain, gait problem, neck pain and neck stiffness.  Skin: Negative for rash and wound.    Neurological: Positive for syncope and light-headedness. Negative for dizziness, weakness and headaches.  Psychiatric/Behavioral: Negative for agitation and confusion.  All other systems reviewed and are negative.    Physical Exam Updated Vital Signs BP 100/61 (BP Location: Right Arm)   Pulse 61   Temp 97.5 F (36.4 C) (Oral)   Resp 16   SpO2 100%   Physical Exam  Constitutional: He is oriented to person, place, and time. He appears well-developed and well-nourished. No distress.  HENT:  Head: Normocephalic and atraumatic.  Right Ear: External ear normal.  Left Ear: External ear normal.  Nose: Nose normal.  Mouth/Throat: Oropharynx is clear and moist. No oropharyngeal exudate.  Eyes: Conjunctivae and EOM are normal. Pupils are equal, round, and reactive to light.  Neck: Normal range of motion. Neck supple.  Cardiovascular: Normal rate, normal heart sounds and intact distal pulses.   No murmur heard.  Pulmonary/Chest: Effort normal and breath sounds normal. No stridor. No respiratory distress. He has no wheezes. He exhibits no tenderness.  Abdominal: Soft. There is no tenderness. There is no rebound and no guarding.  Musculoskeletal: He exhibits no edema or tenderness.  Neurological: He is alert and oriented to person, place, and time. He is not disoriented. He displays normal reflexes. No cranial nerve deficit or sensory deficit. He exhibits normal muscle tone. Coordination normal. GCS eye subscore is 4. GCS verbal subscore is 5. GCS motor subscore is 6.  Patient is sleepy but rousable.   Skin: Skin is warm. Capillary refill takes less than 2 seconds. No rash noted. He is not diaphoretic. No erythema. No pallor.  Psychiatric: He has a normal mood and affect.     ED Treatments / Results  Labs (all labs ordered are listed, but only abnormal results are displayed) Labs Reviewed  CBC WITH DIFFERENTIAL/PLATELET - Abnormal; Notable for the following:       Result Value   RBC  2.37 (*)    Hemoglobin 6.2 (*)    HCT 19.0 (*)    Platelets 112 (*)    All other components within normal limits  COMPREHENSIVE METABOLIC PANEL - Abnormal; Notable for the following:    CO2 20 (*)    Glucose, Bld 262 (*)    BUN 22 (*)    Creatinine, Ser 1.81 (*)    Calcium 8.8 (*)    Total Protein 5.9 (*)    Albumin 3.0 (*)    ALT 15 (*)    GFR calc non Af Amer 37 (*)    GFR calc Af Amer 43 (*)    All other components within normal limits  PROTIME-INR - Abnormal; Notable for the following:    Prothrombin Time 24.0 (*)    All other components within normal limits  MAGNESIUM - Abnormal; Notable for the following:    Magnesium 1.6 (*)    All other components within normal limits  BASIC METABOLIC PANEL - Abnormal; Notable for the following:    Glucose, Bld 169 (*)    BUN 22 (*)    Creatinine, Ser 1.71 (*)    Calcium 8.5 (*)    GFR calc non Af Amer 40 (*)    GFR calc Af Amer 46 (*)    All other components within normal limits  TROPONIN I - Abnormal; Notable for the following:    Troponin I 2.72 (*)    All other components within normal limits  TROPONIN I - Abnormal; Notable for the following:    Troponin I 2.48 (*)    All other components within normal limits  HEMOGLOBIN AND HEMATOCRIT, BLOOD - Abnormal; Notable for the following:    Hemoglobin 8.2 (*)    HCT 24.9 (*)    All other components within normal limits  PROTIME-INR - Abnormal; Notable for the following:    Prothrombin Time 18.6 (*)    All other components within normal limits  GLUCOSE, CAPILLARY - Abnormal; Notable for the following:    Glucose-Capillary 152 (*)    All other components within normal limits  GLUCOSE, CAPILLARY - Abnormal; Notable for the following:    Glucose-Capillary 126 (*)    All other components within normal limits  I-STAT CG4 LACTIC ACID, ED - Abnormal; Notable for the following:    Lactic Acid, Venous 3.18 (*)    All other components within normal limits  I-STAT TROPOININ, ED -  Abnormal; Notable for the following:  Troponin i, poc 0.94 (*)    All other components within normal limits  CBG MONITORING, ED - Abnormal; Notable for the following:    Glucose-Capillary 247 (*)    All other components within normal limits  CBG MONITORING, ED - Abnormal; Notable for the following:    Glucose-Capillary 227 (*)    All other components within normal limits  MRSA PCR SCREENING  LIPASE, BLOOD  MAGNESIUM  PHOSPHORUS  LACTIC ACID, PLASMA  LACTIC ACID, PLASMA  LACTIC ACID, PLASMA  LACTIC ACID, PLASMA  URINALYSIS, ROUTINE W REFLEX MICROSCOPIC  LACTIC ACID, PLASMA  LACTIC ACID, PLASMA  LACTIC ACID, PLASMA  LACTIC ACID, PLASMA  HEMOGLOBIN AND HEMATOCRIT, BLOOD  TYPE AND SCREEN  PREPARE RBC (CROSSMATCH)    EKG  EKG Interpretation  Date/Time:  Wednesday July 17 2016 22:08:57 EST Ventricular Rate:  62 PR Interval:    QRS Duration: 113 QT Interval:  455 QTC Calculation: 463 R Axis:   35 Text Interpretation:  Sinus rhythm Prolonged PR interval Abnormal R-wave progression, late transition LVH with secondary repolarization abnormality No STEMI Confirmed by Sherry Ruffing MD, CHRISTOPHER [id number]) on 07/17/2016 10:24:05 PM       Radiology Ct Head Wo Contrast  Result Date: 07/17/2016 CLINICAL DATA:  Altered mental status, lethargy. History of diabetes. EXAM: CT HEAD WITHOUT CONTRAST TECHNIQUE: Contiguous axial images were obtained from the base of the skull through the vertex without intravenous contrast. COMPARISON:  None available for comparison at time of study interpretation. FINDINGS: BRAIN: The ventricles and sulci are normal for age. No intraparenchymal hemorrhage, mass effect nor midline shift. Patchy to confluent supratentorial white matter hypodensities. Old small LEFT cerebellar infarct. Old small LEFT frontal infarct. No acute large vascular territory infarcts. No abnormal extra-axial fluid collections. Basal cisterns are patent. VASCULAR: Moderate calcific  atherosclerosis of the carotid siphons. SKULL: No skull fracture. No significant scalp soft tissue swelling. SINUSES/ORBITS: The mastoid air-cells and included paranasal sinuses are well-aerated.The included ocular globes and orbital contents are non-suspicious. OTHER: Poor dentition. IMPRESSION: No acute intracranial process. 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 Moderate to severe chronic small vessel ischemic disease. Electronically Signed   By: Elon Alas M.D.   On: 07/17/2016 22:54   Dg Chest Portable 1 View  Result Date: 07/17/2016 CLINICAL DATA:  Syncope EXAM: PORTABLE CHEST 1 VIEW COMPARISON:  10/15/2010 FINDINGS: Heart is enlarged but stable in appearance. Left-sided AICD device with right ventricular defibrillator lead and right atrial pacing wire noted. Focal bend in the proximal right atrial pacing wire noted. Correlate clinically to ensure appropriate function. Aortic atherosclerosis without aneurysm is identified. Left costophrenic angle is excluded. The lungs however are clear. No pneumothorax or significant pleural effusion. No acute osseous abnormality. IMPRESSION: Stable cardiomegaly with aortic atherosclerosis. No acute pulmonary disease. Focal bend in the proximal right atrial pacing wire is suggested. Correlate to assure appropriate functioning of the apparatus. Electronically Signed   By: Ashley Royalty M.D.   On: 07/17/2016 22:41    Procedures Procedures (including critical care time)  CRITICAL CARE Performed by: Gwenyth Allegra Tegeler Total critical care time: 60 minutes Critical care time was exclusive of separately billable procedures and treating other patients. Critical care was necessary to treat or prevent imminent or life-threatening deterioration. Critical care was time spent personally by me on the following activities: development of treatment plan with patient and/or surrogate as well as nursing, discussions with consultants,  evaluation of patient's response to treatment, examination of patient, obtaining history  from patient or surrogate, ordering and performing treatments and interventions, ordering and review of laboratory studies, ordering and review of radiographic studies, pulse oximetry and re-evaluation of patient's condition.   Medications Ordered in ED Medications  0.9 %  sodium chloride infusion (not administered)  insulin aspart (novoLOG) injection 0-15 Units (2 Units Subcutaneous Given 07/18/16 0855)  albuterol (PROVENTIL) (2.5 MG/3ML) 0.083% nebulizer solution 2.5 mg (not administered)  dextromethorphan (DELSYM) 30 MG/5ML liquid 30 mg (30 mg Oral Given 07/18/16 1008)  levothyroxine (SYNTHROID, LEVOTHROID) injection 44 mcg (44 mcg Intravenous Given 07/18/16 1008)  0.9 %  sodium chloride infusion ( Intravenous Rate/Dose Change 07/18/16 0843)  pantoprazole (PROTONIX) injection 40 mg (not administered)  0.9 %  sodium chloride infusion (not administered)  sodium chloride 0.9 % bolus 1,000 mL (0 mLs Intravenous Stopped 07/18/16 0329)  0.9 %  sodium chloride infusion ( Intravenous Stopped 07/18/16 0329)  prothrombin complex conc human (KCENTRA) IVPB 3,759 Units (3,759 Units Intravenous Given 07/18/16 0023)  magnesium sulfate IVPB 2 g 50 mL (0 g Intravenous Stopped 07/18/16 0228)  pantoprazole (PROTONIX) 80 mg in sodium chloride 0.9 % 100 mL IVPB (80 mg Intravenous Given 07/18/16 0428)     Initial Impression / Assessment and Plan / ED Course  I have reviewed the triage vital signs and the nursing notes.  Pertinent labs & imaging results that were available during my care of the patient were reviewed by me and considered in my medical decision making (see chart for details).     Tashaun Lister is a 66 y.o. male With a past medical history significant for diabetes, hepatitis, atrial fibrillation/flutter s/p recent ablation with Defibrillator and Eliquis use, CAD with recent MI s/p Plavix initiation who presents with  syncope, rectal bleed, hypotension, abdominal pain, and severe fatigue.  History and exam are seen above.  On exam, patient is sleepy but rouseable. Lungs are clear. Abdomen is nontender. No focal neurologic deficits.  Based on altered mental status, fatigue, and initiation of Plavix in addition to Eliquis, patient will have CT head to look for intracranial bleed. Patient will have lab testing to look for anemia given concern for G.I. Bleed. Although he does not have infectious symptoms, workup will be started to look for occult infection.   Patient given fluids during start of workup and blood pressure improved into the 90s.  Hemoglobin returned at 6.2. Given the hypotension, altered mental status, syncope, rectal bleeding, and severe anemia on multiple blood thinners, patient will be given two units of blood and will have his anticoagulation reversed for life-threatening bleed.   Cardiology was called when troponin was positive and x-ray showed bend in pacer wire. Cardiology feels pacer wire is normal and troponin may be secondary to recent MI and anemia. Troponins will be trended.  G.I. Was called who agreed with blood and reversal. They will see the patient after admission.  Critical care was called And they will admit the patient for further management.   Final Clinical Impressions(s) / ED Diagnoses   Final diagnoses:  Syncope, unspecified syncope type  Anemia, unspecified type  Gastrointestinal hemorrhage, unspecified gastrointestinal hemorrhage type    Clinical Impression: 1. Syncope, unspecified syncope type   2. Cirrhosis ([place])   3. Liver masses   4. Ascites   5. Anemia, unspecified type   6. Gastrointestinal hemorrhage, unspecified gastrointestinal hemorrhage type     Disposition: Admit to Critical care service    Courtney Paris, MD 07/18/16 [phone]

## 2016-07-17 NOTE — ED Triage Notes (Signed)
Per [facility]  Sitting on the toilet. Originally called out for AMS. Passed EMS stroke screen no ABD pain, CP, not weakness. FD told EMS there was blood in his stool. Pt endorses diarrhea lately and  generalized weakness. A&O x4 now. Lethargic but answers questions too.  Slumped over 84 systolic  [id number] CBG

## 2016-07-18 ENCOUNTER — Encounter (HOSPITAL_COMMUNITY): Payer: Self-pay | Admitting: Physician Assistant

## 2016-07-18 ENCOUNTER — Inpatient Hospital Stay (HOSPITAL_COMMUNITY): Payer: Medicare Other

## 2016-07-18 DIAGNOSIS — K922 Gastrointestinal hemorrhage, unspecified: Secondary | ICD-10-CM

## 2016-07-18 DIAGNOSIS — K745 Biliary cirrhosis, unspecified: Secondary | ICD-10-CM | POA: Diagnosis not present

## 2016-07-18 DIAGNOSIS — D696 Thrombocytopenia, unspecified: Secondary | ICD-10-CM | POA: Diagnosis present

## 2016-07-18 DIAGNOSIS — I959 Hypotension, unspecified: Secondary | ICD-10-CM | POA: Diagnosis present

## 2016-07-18 DIAGNOSIS — R188 Other ascites: Secondary | ICD-10-CM | POA: Diagnosis present

## 2016-07-18 DIAGNOSIS — I2510 Atherosclerotic heart disease of native coronary artery without angina pectoris: Secondary | ICD-10-CM | POA: Diagnosis present

## 2016-07-18 DIAGNOSIS — K559 Vascular disorder of intestine, unspecified: Secondary | ICD-10-CM | POA: Diagnosis present

## 2016-07-18 DIAGNOSIS — I248 Other forms of acute ischemic heart disease: Secondary | ICD-10-CM

## 2016-07-18 DIAGNOSIS — K744 Secondary biliary cirrhosis: Secondary | ICD-10-CM | POA: Diagnosis not present

## 2016-07-18 DIAGNOSIS — D62 Acute posthemorrhagic anemia: Secondary | ICD-10-CM | POA: Diagnosis present

## 2016-07-18 DIAGNOSIS — I25810 Atherosclerosis of coronary artery bypass graft(s) without angina pectoris: Secondary | ICD-10-CM

## 2016-07-18 DIAGNOSIS — K2970 Gastritis, unspecified, without bleeding: Secondary | ICD-10-CM | POA: Diagnosis present

## 2016-07-18 DIAGNOSIS — I129 Hypertensive chronic kidney disease with stage 1 through stage 4 chronic kidney disease, or unspecified chronic kidney disease: Secondary | ICD-10-CM | POA: Diagnosis present

## 2016-07-18 DIAGNOSIS — D123 Benign neoplasm of transverse colon: Secondary | ICD-10-CM | POA: Diagnosis present

## 2016-07-18 DIAGNOSIS — I4891 Unspecified atrial fibrillation: Secondary | ICD-10-CM | POA: Diagnosis present

## 2016-07-18 DIAGNOSIS — R079 Chest pain, unspecified: Secondary | ICD-10-CM | POA: Diagnosis not present

## 2016-07-18 DIAGNOSIS — N183 Chronic kidney disease, stage 3 (moderate): Secondary | ICD-10-CM | POA: Diagnosis present

## 2016-07-18 DIAGNOSIS — E039 Hypothyroidism, unspecified: Secondary | ICD-10-CM | POA: Diagnosis present

## 2016-07-18 DIAGNOSIS — K921 Melena: Secondary | ICD-10-CM | POA: Diagnosis not present

## 2016-07-18 DIAGNOSIS — K7011 Alcoholic hepatitis with ascites: Secondary | ICD-10-CM | POA: Diagnosis not present

## 2016-07-18 DIAGNOSIS — I421 Obstructive hypertrophic cardiomyopathy: Secondary | ICD-10-CM | POA: Diagnosis present

## 2016-07-18 DIAGNOSIS — I679 Cerebrovascular disease, unspecified: Secondary | ICD-10-CM | POA: Diagnosis present

## 2016-07-18 DIAGNOSIS — K7031 Alcoholic cirrhosis of liver with ascites: Secondary | ICD-10-CM | POA: Diagnosis not present

## 2016-07-18 DIAGNOSIS — D638 Anemia in other chronic diseases classified elsewhere: Secondary | ICD-10-CM | POA: Diagnosis present

## 2016-07-18 DIAGNOSIS — I482 Chronic atrial fibrillation: Secondary | ICD-10-CM

## 2016-07-18 DIAGNOSIS — D649 Anemia, unspecified: Secondary | ICD-10-CM | POA: Diagnosis not present

## 2016-07-18 DIAGNOSIS — K7460 Unspecified cirrhosis of liver: Secondary | ICD-10-CM | POA: Diagnosis present

## 2016-07-18 DIAGNOSIS — K51011 Ulcerative (chronic) pancolitis with rectal bleeding: Secondary | ICD-10-CM | POA: Diagnosis present

## 2016-07-18 DIAGNOSIS — N179 Acute kidney failure, unspecified: Secondary | ICD-10-CM | POA: Diagnosis present

## 2016-07-18 DIAGNOSIS — I4892 Unspecified atrial flutter: Secondary | ICD-10-CM | POA: Diagnosis present

## 2016-07-18 DIAGNOSIS — E1122 Type 2 diabetes mellitus with diabetic chronic kidney disease: Secondary | ICD-10-CM | POA: Diagnosis present

## 2016-07-18 DIAGNOSIS — K2990 Gastroduodenitis, unspecified, without bleeding: Secondary | ICD-10-CM | POA: Diagnosis present

## 2016-07-18 DIAGNOSIS — I472 Ventricular tachycardia: Secondary | ICD-10-CM | POA: Diagnosis present

## 2016-07-18 DIAGNOSIS — K7030 Alcoholic cirrhosis of liver without ascites: Secondary | ICD-10-CM | POA: Diagnosis not present

## 2016-07-18 HISTORY — DX: Gastrointestinal hemorrhage, unspecified: K92.2

## 2016-07-18 LAB — GLUCOSE, CAPILLARY
GLUCOSE-CAPILLARY: 130 mg/dL — AB (ref 65–99)
GLUCOSE-CAPILLARY: 152 mg/dL — AB (ref 65–99)
GLUCOSE-CAPILLARY: 251 mg/dL — AB (ref 65–99)
GLUCOSE-CAPILLARY: 63 mg/dL — AB (ref 65–99)
Glucose-Capillary: 126 mg/dL — ABNORMAL HIGH (ref 65–99)
Glucose-Capillary: 257 mg/dL — ABNORMAL HIGH (ref 65–99)

## 2016-07-18 LAB — LACTIC ACID, PLASMA
LACTIC ACID, VENOUS: 2.3 mmol/L — AB (ref 0.5–1.9)
LACTIC ACID, VENOUS: 1.6 mmol/L (ref 0.5–1.9)
LACTIC ACID, VENOUS: 3.1 mmol/L — AB (ref 0.5–1.9)
Lactic Acid, Venous: 1.6 mmol/L (ref 0.5–1.9)
Lactic Acid, Venous: 1.4 mmol/L (ref 0.5–1.9)
Lactic Acid, Venous: 1.5 mmol/L (ref 0.5–1.9)
Lactic Acid, Venous: 1 mmol/L (ref 0.5–1.9)
Lactic Acid, Venous: 0.9 mmol/L (ref 0.5–1.9)

## 2016-07-18 LAB — BASIC METABOLIC PANEL
ANION GAP: 9 (ref 5–15)
BUN: 22 mg/dL — ABNORMAL HIGH (ref 6–20)
CALCIUM: 8.5 mg/dL — AB (ref 8.9–10.3)
CO2: 22 mmol/L (ref 22–32)
Chloride: 109 mmol/L (ref 101–111)
Creatinine, Ser: 1.71 mg/dL — ABNORMAL HIGH (ref 0.61–1.24)
GFR, EST AFRICAN AMERICAN: 46 mL/min — AB (ref >60)
GFR, EST NON AFRICAN AMERICAN: 40 mL/min — AB (ref >60)
Glucose, Bld: 169 mg/dL — ABNORMAL HIGH (ref 65–99)
POTASSIUM: 3.9 mmol/L (ref 3.5–5.1)
SODIUM: 140 mmol/L (ref 135–145)

## 2016-07-18 LAB — HEMOGLOBIN AND HEMATOCRIT, BLOOD
HCT: 23.1 % — ABNORMAL LOW (ref 39.0–52.0)
HEMATOCRIT: 24.9 % — AB (ref 39.0–52.0)
HEMOGLOBIN: 7.8 g/dL — AB (ref 13.0–17.0)
Hemoglobin: 8.2 g/dL — ABNORMAL LOW (ref 13.0–17.0)

## 2016-07-18 LAB — URINALYSIS, ROUTINE W REFLEX MICROSCOPIC
BILIRUBIN URINE: NEGATIVE
Bacteria, UA: NONE SEEN
Glucose, UA: >=500 mg/dL — AB
Ketones, ur: NEGATIVE mg/dL
Leukocytes, UA: NEGATIVE
NITRITE: NEGATIVE
PH: 6 (ref 5.0–8.0)
Protein, ur: NEGATIVE mg/dL
SPECIFIC GRAVITY, URINE: 1.015 (ref 1.005–1.030)

## 2016-07-18 LAB — TROPONIN I
TROPONIN I: 2.72 ng/mL — AB (ref <0.03)
TROPONIN I: 2.48 ng/mL — AB (ref <0.03)

## 2016-07-18 LAB — CBG MONITORING, ED: Glucose-Capillary: 227 mg/dL — ABNORMAL HIGH (ref 65–99)

## 2016-07-18 LAB — PROTIME-INR
INR: 1.53
PROTHROMBIN TIME: 18.6 s — AB (ref 11.4–15.2)

## 2016-07-18 LAB — MAGNESIUM: MAGNESIUM: 2.4 mg/dL (ref 1.7–2.4)

## 2016-07-18 LAB — PHOSPHORUS: PHOSPHORUS: 2.7 mg/dL (ref 2.5–4.6)

## 2016-07-18 LAB — MRSA PCR SCREENING: MRSA BY PCR: NEGATIVE

## 2016-07-18 MED ORDER — SODIUM CHLORIDE 0.9 % IV SOLN: 250.0000 mL | INTRAVENOUS | Status: DC | PRN

## 2016-07-18 MED ORDER — MAGNESIUM SULFATE 2 GM/50ML IV SOLN
2.0000 g | Freq: Once | INTRAVENOUS | Status: AC
  Administered 2016-07-18: 2 g via INTRAVENOUS
  Filled 2016-07-18: qty 50

## 2016-07-18 MED ORDER — SODIUM CHLORIDE 0.9 % IV SOLN
8.0000 mg/h | INTRAVENOUS | Status: DC
  Administered 2016-07-18: 8 mg/h via INTRAVENOUS
  Filled 2016-07-18 (×2): qty 80

## 2016-07-18 MED ORDER — DEXTROMETHORPHAN POLISTIREX ER 30 MG/5ML PO SUER
30.0000 mg | Freq: Two times a day (BID) | ORAL | Status: DC
  Administered 2016-07-18 – 2016-07-21 (×8): 30 mg via ORAL
  Filled 2016-07-18 (×11): qty 5

## 2016-07-18 MED ORDER — PANTOPRAZOLE SODIUM 40 MG IV SOLR
40.0000 mg | Freq: Two times a day (BID) | INTRAVENOUS | Status: DC
  Administered 2016-07-18: 40 mg via INTRAVENOUS
  Filled 2016-07-18: qty 40

## 2016-07-18 MED ORDER — SODIUM CHLORIDE 0.9 % IV SOLN: INTRAVENOUS | Status: DC

## 2016-07-18 MED ORDER — INSULIN ASPART 100 UNIT/ML ~~LOC~~ SOLN
0.0000 [IU] | SUBCUTANEOUS | Status: DC
  Administered 2016-07-18: 2 [IU] via SUBCUTANEOUS
  Administered 2016-07-18: 5 [IU] via SUBCUTANEOUS
  Administered 2016-07-18: 8 [IU] via SUBCUTANEOUS
  Administered 2016-07-18: 2 [IU] via SUBCUTANEOUS
  Administered 2016-07-18: 3 [IU] via SUBCUTANEOUS
  Administered 2016-07-19: 5 [IU] via SUBCUTANEOUS
  Administered 2016-07-19: 3 [IU] via SUBCUTANEOUS
  Administered 2016-07-19: 5 [IU] via SUBCUTANEOUS
  Administered 2016-07-20: 3 [IU] via SUBCUTANEOUS
  Administered 2016-07-20: 5 [IU] via SUBCUTANEOUS
  Administered 2016-07-20: 3 [IU] via SUBCUTANEOUS
  Administered 2016-07-20 – 2016-07-21 (×2): 2 [IU] via SUBCUTANEOUS
  Administered 2016-07-21 (×2): 3 [IU] via SUBCUTANEOUS
  Administered 2016-07-21: 5 [IU] via SUBCUTANEOUS
  Administered 2016-07-22: 2 [IU] via SUBCUTANEOUS
  Filled 2016-07-18: qty 1

## 2016-07-18 MED ORDER — ALBUTEROL SULFATE (2.5 MG/3ML) 0.083% IN NEBU: 2.5000 mg | INHALATION_SOLUTION | RESPIRATORY_TRACT | Status: DC | PRN

## 2016-07-18 MED ORDER — LEVOTHYROXINE SODIUM 100 MCG IV SOLR
44.0000 ug | Freq: Every day | INTRAVENOUS | Status: DC
Start: 2016-07-18 — End: 2016-07-22
  Administered 2016-07-18 – 2016-07-22 (×4): 44 ug via INTRAVENOUS
  Filled 2016-07-18 (×5): qty 5

## 2016-07-18 MED ORDER — SODIUM CHLORIDE 0.9 % IV SOLN
INTRAVENOUS | Status: DC
Start: 2016-07-18 — End: 2016-07-22
  Administered 2016-07-18 – 2016-07-20 (×2): via INTRAVENOUS

## 2016-07-18 MED ORDER — PANTOPRAZOLE SODIUM 40 MG IV SOLR: 40.0000 mg | Freq: Two times a day (BID) | INTRAVENOUS | Status: DC

## 2016-07-18 MED ORDER — PANTOPRAZOLE SODIUM 40 MG IV SOLR
80.0000 mg | Freq: Once | INTRAVENOUS | Status: AC
  Administered 2016-07-18: 04:00:00 80 mg via INTRAVENOUS
  Filled 2016-07-18: qty 80

## 2016-07-18 MED ORDER — SODIUM CHLORIDE 0.9 % IV BOLUS (SEPSIS)
500.0000 mL | Freq: Once | INTRAVENOUS | Status: AC
  Administered 2016-07-19: 500 mL via INTRAVENOUS

## 2016-07-18 NOTE — Progress Notes (Signed)
CRITICAL VALUE ALERT  Critical value received:  Lactic Acid 2.3  Date of notification:  07/18/16  Time of notification: 1614  Critical value read back: yes  Nurse who received alert:  Rufina Falco, RN  MD notified (1st page):  [place]rol  Time of first page:  1614  Responding MD:  Allyson Sabal  Time MD responded:  [phone]

## 2016-07-18 NOTE — Progress Notes (Addendum)
[facility] PROGRESS NOTE  Norma Minette [id number] DOB: 1950-12-18 DOA: 07/17/2016   PCP: Pcp Not In System     Assessment/Plan: Active Problems:   GI bleed   Ascites   Cirrhosis ([place])   66 y.o. male with PMH as outlined below. He presented to [facility] ED 02/08 with generalized weakness and near syncopal event while in bathroom (but never had full syncope).  When his daughter helped him up, she noted that he had blood in his stool.  She was unable to tell if this was bright red or not as "it was mixed with liquid".  He is on Eliquis on A.fib and Plavix for CAD.  Denies any ASA or NSAID use. Has never had GI bleed in the past.  Had 1 episode of vomiting earlier in the day but otherwise no N/V/D, abd pain.  In ED, Hgb noted to be 6.2.  He was given Eppie Gibson and GI was consulted and they plan to see him tonight.  Initially he had borderline BP which has since resolved.  In addition, he had 1 or 2 episodes of arrhythmia; therefore, PCCM was asked to admit.   Assessment and plan Overt GI bleed-positive FOBT  in the setting of Lasix and  Eliquis ,S/p K centra.   We need to R/o portal gastropathy, varices.  R/o PUD.  R/o telangectasia.  R/o lower GI bleed: ischemic colitis in setting of recent rapid a fib/flutter, r/o neoplasia (no previous colonoscopy).   GI plans to do EGD tomorrow, continue IV Lasix No need for octreotide but getting up to date imaging of his liver to check for signs of cirrhosis given his previous Etoh abuse, Hep C  probably need colonoscopy after more complete plavix washout      normocytic anemia.  S/p PRBC x2.    *  Hx Hep C, treated successfully in late 2015.  ? Cirrhosis.   abdominal ultrasound to assess for ascites  *  Thrombocytopenia.112, follow   *  Elevated Troponins, ? Demand ischemia. 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    *  S/p sleep study 2/4, Tech program at test felt that this was positive for OSA but the patient and his family have not yet heard from the doctor with the official report  Hypomagnesemia-repleted, now 2.4  DVT prophylaxsis SCDs  Code Status:  Full code    Family Communication: Discussed in detail with the patient, all imaging results, lab results explained to the patient   Disposition Plan:  Monitor on telemetry      Consultants:  Critical care  GI    Procedures:  None  Antibiotics: Anti-infectives    None         HPI/Subjective: No significant hematochezia, blood pressure soft  Objective: Vitals:   07/18/16 1130 07/18/16 1200 07/18/16 1230 07/18/16 1231  BP: 128/68 132/65 (!) 138/58   Pulse: 67 69 67   Resp: (!) 21 19 20    Temp:    98.1 F (36.7 C)  TempSrc:    Oral  SpO2: 99% 100% 100%   Weight:      Height:        Intake/Output Summary (Last 24  hours) at 07/18/16 1330 Last data filed at 07/18/16 1200  Gross per 24 hour  Intake           923.75 ml  Output              285 ml  Net           638.75 ml    Exam:  Examination:  General exam: Appears calm and comfortable  Respiratory system: Clear to auscultation. Respiratory effort normal. Cardiovascular system: S1 & S2 heard, RRR. No JVD, murmurs, rubs, gallops or clicks. No pedal edema. Gastrointestinal system: Abdomen is nondistended, soft and nontender. No organomegaly or masses felt. Normal bowel sounds heard. Central nervous system: Alert and oriented. No focal neurological deficits. Extremities: Symmetric 5 x 5 power. Skin: No rashes, lesions or ulcers Psychiatry: Judgement and insight appear normal. Mood & affect appropriate.     Data Reviewed: I have personally reviewed  following labs and imaging studies  Micro Results Recent Results (from the past 240 hour(s))  MRSA PCR Screening     Status: None   Collection Time: 07/18/16  4:49 AM  Result Value Ref Range Status   MRSA by PCR NEGATIVE NEGATIVE Final    Comment:        The GeneXpert MRSA Assay (FDA approved for NASAL specimens only), is one component of a comprehensive MRSA colonization surveillance program. It is not intended to diagnose MRSA infection nor to guide or monitor treatment for MRSA infections.     Radiology Reports Ct Head Wo Contrast  Result Date: 07/17/2016 CLINICAL DATA:  Altered mental status, lethargy. History of diabetes. EXAM: CT HEAD WITHOUT CONTRAST TECHNIQUE: Contiguous axial images were obtained from the base of the skull through the vertex without intravenous contrast. COMPARISON:  None available for comparison at time of study interpretation. FINDINGS: BRAIN: The ventricles and sulci are normal for age. No intraparenchymal hemorrhage, mass effect nor midline shift. Patchy to confluent supratentorial white matter hypodensities. Old small LEFT cerebellar infarct. Old small LEFT frontal infarct. No acute large vascular territory infarcts. No abnormal extra-axial fluid collections. Basal cisterns are patent. VASCULAR: Moderate calcific atherosclerosis of the carotid siphons. SKULL: No skull fracture. No significant scalp soft tissue swelling. SINUSES/ORBITS: The mastoid air-cells and included paranasal sinuses are well-aerated.The included ocular globes and orbital contents are non-suspicious. OTHER: Poor dentition. IMPRESSION: No acute intracranial process. 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 Moderate to severe chronic small vessel ischemic disease. Electronically Signed   By: Elon Alas M.D.   On: 07/17/2016 22:54   Dg Chest Portable 1 View  Result Date: 07/17/2016 CLINICAL DATA:  Syncope EXAM: PORTABLE CHEST 1 VIEW COMPARISON:  10/15/2010  FINDINGS: Heart is enlarged but stable in appearance. Left-sided AICD device with right ventricular defibrillator lead and right atrial pacing wire noted. Focal bend in the proximal right atrial pacing wire noted. Correlate clinically to ensure appropriate function. Aortic atherosclerosis without aneurysm is identified. Left costophrenic angle is excluded. The lungs however are clear. No pneumothorax or significant pleural effusion. No acute osseous abnormality. IMPRESSION: Stable cardiomegaly with aortic atherosclerosis. No acute pulmonary disease. Focal bend in the proximal right atrial pacing wire is suggested. Correlate to assure appropriate functioning of the apparatus. Electronically Signed   By: Ashley Royalty M.D.   On: 07/17/2016 22:41     CBC  Recent Labs Lab 07/17/16 2229 07/18/16 0955  WBC 10.1  --   HGB 6.2* 8.2*  HCT 19.0* 24.9*  PLT 112*  --  MCV 80.2  --   MCH 26.2  --   MCHC 32.6  --   RDW 15.5  --   LYMPHSABS 2.2  --   MONOABS 0.8  --   EOSABS 0.1  --   BASOSABS 0.0  --     Chemistries   Recent Labs Lab 07/17/16 2229 07/18/16 0427  NA 137 140  K 3.9 3.9  CL 108 109  CO2 20* 22  GLUCOSE 262* 169*  BUN 22* 22*  CREATININE 1.81* 1.71*  CALCIUM 8.8* 8.5*  MG 1.6* 2.4  AST 20  --   ALT 15*  --   ALKPHOS 41  --   BILITOT 0.7  --    ------------------------------------------------------------------------------------------------------------------ estimated creatinine clearance is 45.4 mL/min (by C-G formula based on SCr of 1.71 mg/dL (H)). ------------------------------------------------------------------------------------------------------------------ No results for input(s): HGBA1C in the last 72 hours. ------------------------------------------------------------------------------------------------------------------ No results for input(s): CHOL, HDL, LDLCALC, TRIG, CHOLHDL, LDLDIRECT in the last 72  hours. ------------------------------------------------------------------------------------------------------------------ No results for input(s): TSH, T4TOTAL, T3FREE, THYROIDAB in the last 72 hours.  Invalid input(s): FREET3 ------------------------------------------------------------------------------------------------------------------ No results for input(s): VITAMINB12, FOLATE, FERRITIN, TIBC, IRON, RETICCTPCT in the last 72 hours.  Coagulation profile  Recent Labs Lab 07/17/16 2229 07/18/16 0427  INR 2.11 1.53    No results for input(s): DDIMER in the last 72 hours.  Cardiac Enzymes  Recent Labs Lab 07/18/16 0427 07/18/16 0955  TROPONINI 2.72* 2.48*   ------------------------------------------------------------------------------------------------------------------ Invalid input(s): POCBNP   CBG:  Recent Labs Lab 07/17/16 2225 07/18/16 0040 07/18/16 0520 07/18/16 0835 07/18/16 1229  GLUCAP 247* 227* 152* 126* 130*       Studies: Ct Head Wo Contrast  Result Date: 07/17/2016 CLINICAL DATA:  Altered mental status, lethargy. History of diabetes. EXAM: CT HEAD WITHOUT CONTRAST TECHNIQUE: Contiguous axial images were obtained from the base of the skull through the vertex without intravenous contrast. COMPARISON:  None available for comparison at time of study interpretation. FINDINGS: BRAIN: The ventricles and sulci are normal for age. No intraparenchymal hemorrhage, mass effect nor midline shift. Patchy to confluent supratentorial white matter hypodensities. Old small LEFT cerebellar infarct. Old small LEFT frontal infarct. No acute large vascular territory infarcts. No abnormal extra-axial fluid collections. Basal cisterns are patent. VASCULAR: Moderate calcific atherosclerosis of the carotid siphons. SKULL: No skull fracture. No significant scalp soft tissue swelling. SINUSES/ORBITS: The mastoid air-cells and included paranasal sinuses are well-aerated.The included  ocular globes and orbital contents are non-suspicious. OTHER: Poor dentition. IMPRESSION: No acute intracranial process. 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 Moderate to severe chronic small vessel ischemic disease. Electronically Signed   By: Elon Alas M.D.   On: 07/17/2016 22:54   Dg Chest Portable 1 View  Result Date: 07/17/2016 CLINICAL DATA:  Syncope EXAM: PORTABLE CHEST 1 VIEW COMPARISON:  10/15/2010 FINDINGS: Heart is enlarged but stable in appearance. Left-sided AICD device with right ventricular defibrillator lead and right atrial pacing wire noted. Focal bend in the proximal right atrial pacing wire noted. Correlate clinically to ensure appropriate function. Aortic atherosclerosis without aneurysm is identified. Left costophrenic angle is excluded. The lungs however are clear. No pneumothorax or significant pleural effusion. No acute osseous abnormality. IMPRESSION: Stable cardiomegaly with aortic atherosclerosis. No acute pulmonary disease. Focal bend in the proximal right atrial pacing wire is suggested. Correlate to assure appropriate functioning of the apparatus. Electronically Signed   By: Ashley Royalty M.D.   On: 07/17/2016 22:41      Lab Results  Component  Value Date   HGBA1C 15.7 (H) 04/12/2014   Lab Results  Component Value Date   CREATININE 1.71 (H) 07/18/2016       Scheduled Meds: . dextromethorphan  30 mg Oral BID  . insulin aspart  0-15 Units Subcutaneous Q4H  . levothyroxine  44 mcg Intravenous Daily  . pantoprazole  40 mg Intravenous Q12H   Continuous Infusions: . sodium chloride 10 mL/hr at 07/18/16 0843  . sodium chloride       LOS: 0 days    Time spent: >30 MINS    [facility]  [facility] Pager [phone]. If 7PM-7AM, please contact night-coverage at [email or web address], password [facility] 07/18/2016, 1:30 PM  LOS: 0 days

## 2016-07-18 NOTE — Progress Notes (Signed)
Dr. Lake Bells at bedside, informed md troponin result of 2.72 drawn 1.5 hr after post blood transfusion

## 2016-07-18 NOTE — Consult Note (Signed)
[facility] Consult: 10:18 AM 07/18/2016  LOS: 0 days    Referring Provider: Dr Allyson Sabal.   Primary Care Physician:  Followed at [facility] in [facility] Gastroenterologist:  At [facility]    Reason for Consultation:  Blood in stool    HPI: Anthony Mcfarland is a 66 y.o. male.  PMH HOCM. Non-obstructive CAD.  NSVT.  Afib, s/p ablation 03/2016, on Eliquis.  S/p ICD 2015.   Insulin requiring type 2 DM.  Hypothyroidism.  Htn.  CKD stage 3, followed by nephrology at the [facility].  Thrombocytopenia ([place] 54 in 04/2014)  Lap chole 2008.  Elevated LFTs 2015, ultrasound with normal looking liver.  Anemia of chronic disease.  Hepatitis C and completed antiretroviral therapy in late 2015.  Recent abdominal ultrasound showed full liver lesions and possible cirrhosis, this according to his daughter who is quite well informed. Takes both Eliquis and Plavix.    No PPI or H2 blocker at home   Patient admitted to the [facility] where he receives all his regular care, from 1/22 - 1/28.  Admitted with rapid A flutter, MI, cough. He underwent cardiac catheterization where he had "no significant blockages". His daughter says that he was slow to wake up from anesthesia.  Patient was initiated on Plavix, added to his Eliquis during this admission Patient spent the night undergoing sleep studies at the [facility] on Sunday, February 4.  After the ride home on Monday the patient became lightheaded after getting out of the car and fell. He still had something of a cough. Over the next couple of days he was more lethargic and wasn't eating as well.  Yesterday he complained of vague abdominal pain. Last evening the patient used the bathroom and passed watery stool which appear to have blood in it. Daughter does not describe it as tarry or melenic. No nausea or  vomiting. He was very weak and near syncopal.  Family brought him to [place].   Hgb 6.2.   MCV 80.  Platelets 112.  PT/INR: 24/2.1.  + AKI.   Troponin to 2.7.  Elevated lactate.  Administered Kcentra, PRBCs x 2.  Started on Protonix drip.   Has not had any further BMs after the one incident.   Patient's daughter says there were no known issues of anemia at the [place] 2 weeks ago. She is not aware of him ever having had blood transfusions. He used to drink regularly, she does not think he was a heavy drinker, but he quit at least 10 years ago. He doesn't use NSAIDs. He has had remote upper endoscopy. There were plans in place to set the patient up for colonoscopy in 03/2016 but other medical issues intervened and this test was never done.  Daughter also says that the nephrologist in [place] had recently commented on the patient's weight gain.   Past Medical History:  Diagnosis Date  . Anemia in chronic illness 04/13/2014  . Atrial flutter [facility])    s/p ablation 03/2016 and 06/2016 at [facility].    Marland Kitchen Coronary artery disease   .  Diabetes mellitus without complication ([place])   . Hepatitis C    This was treated in late 2015 with anti-viral medications overseen at [facility].    Past Surgical History:  Procedure Laterality Date  . CARDIAC DEFIBRILLATOR PLACEMENT    . CHOLECYSTECTOMY    . right leg ankle surgery      Prior to Admission medications   Medication Sig Start Date End Date Taking? Authorizing Provider  albuterol (PROVENTIL HFA;VENTOLIN HFA) 108 (90 Base) MCG/ACT inhaler Inhale 2 puffs into the lungs every 6 (six) hours as needed for wheezing or shortness of breath.   Yes Historical Provider, MD  amLODipine (NORVASC) 10 MG tablet Take 10 mg by mouth daily.   Yes Historical Provider, MD  apixaban (ELIQUIS) 5 MG TABS tablet Take 5 mg by mouth 2 (two) times daily.   Yes Historical Provider, MD  carvedilol (COREG) 25 MG tablet Take 25 mg by mouth 2 (two) times daily with a meal.   Yes Historical  Provider, MD  cholecalciferol (VITAMIN D) 1000 units tablet Take 2,000 Units by mouth daily.   Yes Historical Provider, MD  clopidogrel (PLAVIX) 75 MG tablet Take 75 mg by mouth daily.   Yes Historical Provider, MD  furosemide (LASIX) 20 MG tablet Take 20 mg by mouth daily.   Yes Historical Provider, MD  gabapentin (NEURONTIN) 300 MG capsule Take 1 capsule (300 mg total) by mouth at bedtime. Patient taking differently: Take 600 mg by mouth 3 (three) times daily.  04/14/14  Yes Ripudeep Krystal Eaton, MD  insulin glargine (LANTUS) 100 UNIT/ML injection Inject 0.3 mLs (30 Units total) into the skin at bedtime. Patient taking differently: Inject 20 Units into the skin at bedtime.  04/14/14  Yes Ripudeep Krystal Eaton, MD  levothyroxine (SYNTHROID, LEVOTHROID) 88 MCG tablet Take 88 mcg by mouth daily before breakfast.   Yes Historical Provider, MD  lisinopril (PRINIVIL,ZESTRIL) 40 MG tablet Take 40 mg by mouth daily.   Yes Historical Provider, MD  sildenafil (VIAGRA) 50 MG tablet Take 50 mg by mouth daily as needed for erectile dysfunction.   Yes Historical Provider, MD  terazosin (HYTRIN) 2 MG capsule Take 1 mg by mouth at bedtime.    Yes Historical Provider, MD    Scheduled Meds: . dextromethorphan  30 mg Oral BID  . insulin aspart  0-15 Units Subcutaneous Q4H  . levothyroxine  44 mcg Intravenous Daily  . [START ON 07/21/2016] pantoprazole  40 mg Intravenous Q12H   Infusions: . sodium chloride 10 mL/hr at 07/18/16 0843  . pantoprozole (PROTONIX) infusion 8 mg/hr (07/18/16 0530)   PRN Meds: sodium chloride, albuterol   Allergies as of 07/17/2016 - Review Complete 07/17/2016  Allergen Reaction Noted  . Grapefruit extract Other (See Comments) 11/28/2012  . Amoxicillin Other (See Comments) 04/11/2014    Family History  Problem Relation Age of Onset  . Cancer Mother     colon cancer    Social History   Social History  . Marital status: Single    Spouse name: N/A  . Number of children: N/A  .  Years of education: N/A   Occupational History  . Not on file.   Social History Main Topics  . Smoking status: Never Smoker  . Smokeless tobacco: Never Used  . Alcohol use No  . Drug use: Yes    Types: Marijuana  . Sexual activity: Not on file   Other Topics Concern  . Not on file   Social History Narrative  . No  narrative on file    REVIEW OF SYSTEMS: Constitutional:  Weakness. ENT:  No nose bleeds Pulm:  Stable dyspnea. Cough, periodically productive. CV:  No palpitations, no LE edema.  No chest pain GU:  No hematuria, no frequency GI:  No dysphagia. Recent anorexia. Heme:  Patient develops small bruises easily. No unusual bleeding from mouth, nose.   Transfusions:  None in past.   Neuro:  Takes gabapentin for peripheral neuropathic pain  Derm:  No itching, no rash or sores.  Endocrine:  No sweats or chills.  No polyuria or dysuria Immunization:  Not queried.  Travel:  None beyond local counties in last few months.    PHYSICAL EXAM: Vital signs in last 24 hours: Vitals:   07/18/16 0930 07/18/16 1000  BP: 135/66 120/62  Pulse: 68 65  Resp: 17 17  Temp:     Wt Readings from Last 3 Encounters:  07/18/16 75.6 kg (166 lb 10.7 oz)  04/12/14 69.4 kg (153 lb)    General: Chronically and acutely ill appearing AAM. Looks malnourished.  lethargic Head:  No asymmetry or swelling  Eyes:  No icterus or conj pallor Ears:  Not HOH  Nose:  No congestion or discharge  Mouth:  Tongue midline. Oral mucosa moist and clear. Dentition in fair repair. Neck:  No JVD, masses or thyromegaly. Lungs:  Clear bilaterally. Somewhat productive cough. No labored breathing. Heart:  RRR. Soft 1/6 systolic murmur. S1, S2 present. Abdomen:  Soft. Minimally distended. No HSM, no mass..   Rectal:  Stool is unformed, dark brown and FOBT positive.   Musc/Skeltl:  No gross joint deformities, swelling or erythema.  Musculature somewhat atrophic/wasted Extremities:  No CCE.  Neurologic:  No  asterixis. Moves all 4 limbs. Slight tremor in the hands. Oriented times 3. Lethargic but does wake up easily Skin:  No telangiectasia. Some purpura on the arms. Nodes:  No cervical adenopathy.   Psych:  Affect flat, not agitated.  Laconic.    LAB RESULTS:  Recent Labs  07/17/16 2229  WBC 10.1  HGB 6.2*  HCT 19.0*  PLT 112*   BMET Lab Results  Component Value Date   NA 140 07/18/2016   NA 137 07/17/2016   NA 135 (L) 04/14/2014   K 3.9 07/18/2016   K 3.9 07/17/2016   K 3.7 04/14/2014   CL 109 07/18/2016   CL 108 07/17/2016   CL 100 04/14/2014   CO2 22 07/18/2016   CO2 20 (L) 07/17/2016   CO2 22 04/14/2014   GLUCOSE 169 (H) 07/18/2016   GLUCOSE 262 (H) 07/17/2016   GLUCOSE 228 (H) 04/14/2014   BUN 22 (H) 07/18/2016   BUN 22 (H) 07/17/2016   BUN 12 04/14/2014   CREATININE 1.71 (H) 07/18/2016   CREATININE 1.81 (H) 07/17/2016   CREATININE 1.21 04/14/2014   CALCIUM 8.5 (L) 07/18/2016   CALCIUM 8.8 (L) 07/17/2016   CALCIUM 7.8 (L) 04/14/2014   LFT  Recent Labs  07/17/16 2229  PROT 5.9*  ALBUMIN 3.0*  AST 20  ALT 15*  ALKPHOS 41  BILITOT 0.7   PT/INR Lab Results  Component Value Date   INR 1.53 07/18/2016   INR 2.11 07/17/2016   INR 1.29 04/13/2014   Hepatitis Panel No results for input(s): HEPBSAG, HCVAB, HEPAIGM, HEPBIGM in the last 72 hours. C-Diff No components found for: CDIFF Lipase     Component Value Date/Time   LIPASE 13 07/17/2016 2229    Drugs of Abuse  No results found for:  LABOPIA, COCAINSCRNUR, LABBENZ, AMPHETMU, THCU, LABBARB   RADIOLOGY STUDIES: Ct Head Wo Contrast  Result Date: 07/17/2016 CLINICAL DATA:  Altered mental status, lethargy. History of diabetes. EXAM: CT HEAD WITHOUT CONTRAST TECHNIQUE: Contiguous axial images were obtained from the base of the skull through the vertex without intravenous contrast. COMPARISON:  None available for comparison at time of study interpretation. FINDINGS: BRAIN: The ventricles and sulci are  normal for age. No intraparenchymal hemorrhage, mass effect nor midline shift. Patchy to confluent supratentorial white matter hypodensities. Old small LEFT cerebellar infarct. Old small LEFT frontal infarct. No acute large vascular territory infarcts. No abnormal extra-axial fluid collections. Basal cisterns are patent. VASCULAR: Moderate calcific atherosclerosis of the carotid siphons. SKULL: No skull fracture. No significant scalp soft tissue swelling. SINUSES/ORBITS: The mastoid air-cells and included paranasal sinuses are well-aerated.The included ocular globes and orbital contents are non-suspicious. OTHER: Poor dentition. IMPRESSION: No acute intracranial process. 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 Moderate to severe chronic small vessel ischemic disease. Electronically Signed   By: Elon Alas M.D.   On: 07/17/2016 22:54   Dg Chest Portable 1 View  Result Date: 07/17/2016 CLINICAL DATA:  Syncope EXAM: PORTABLE CHEST 1 VIEW COMPARISON:  10/15/2010 FINDINGS: Heart is enlarged but stable in appearance. Left-sided AICD device with right ventricular defibrillator lead and right atrial pacing wire noted. Focal bend in the proximal right atrial pacing wire noted. Correlate clinically to ensure appropriate function. Aortic atherosclerosis without aneurysm is identified. Left costophrenic angle is excluded. The lungs however are clear. No pneumothorax or significant pleural effusion. No acute osseous abnormality. IMPRESSION: Stable cardiomegaly with aortic atherosclerosis. No acute pulmonary disease. Focal bend in the proximal right atrial pacing wire is suggested. Correlate to assure appropriate functioning of the apparatus. Electronically Signed   By: Ashley Royalty M.D.   On: 07/17/2016 22:41     IMPRESSION:   *  FOBT + stool in setting of both Plavix and Eliquis ( last doses were 2/7).  S/p K centra.   ? If this is upper vs lower GIB.  R/o portal gastropathy, varices.   R/o PUD.  R/o telangectasia.  R/o lower GI bleed: ischemic colitis in setting of recent rapid a fib/flutter, r/o neoplasia (no previous colonoscopy).    *  normocytic anemia.  S/p PRBC x2.    *  Hx Hep C, treated successfully in late 2015.  ? Cirrhosis.    *  Thrombocytopenia.  *  Elevated Troponins, ? Demand ischemia.  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    *  S/p sleep study 2/4, Tech program at test felt that this was positive for OSA but the patient and his family have not yet heard from the doctor with the official report    PLAN:     *  EGD set fpr 9 AM tomorrow.  Allow clears.    *  Switch to BID IV Protonix.   *  Abdominal ultrasound to assess for ascites, and cirrhosis. [place] 3-4 years ago showed relatively normal liver without signs of cirrhosis.   Azucena Freed  07/18/2016, 10:18 AM Pager: [phone]    ________________________________________________________________________  Velora Heckler GI MD note:  I personally examined the patient, reviewed the data and agree with the assessment and plan described above. Significant anemia with FOBT + stool in this man on two potent blood thinners, one of which was just started very  recently (plavix).  No significant, overt bleeding.  I agree with IV PPI for now.  No need for octreotide but getting up to date imaging of his liver to check for signs of cirrhosis given his previous Etoh abuse, Hep C is a good idea (we've ordered US).  Planning on EGD tomorrow.  If no obvious source of his anemia is found then he will probably need colonoscopy after more complete plavix washout (5 days from last dose yesterday will be on Monday).   Owens Loffler, MD [facility] Pager [phone]

## 2016-07-18 NOTE — H&P (Signed)
PULMONARY / CRITICAL CARE MEDICINE   Name: Anthony Mcfarland MRN: [id number] DOB: [birth year]    ADMISSION DATE:  07/17/2016 CONSULTATION DATE:  07/18/16  REFERRING MD:  Tegeler  CHIEF COMPLAINT:  Weakness  HISTORY OF PRESENT ILLNESS: Anthony Mcfarland is a 66 y.o. male with PMH as outlined below. He presented to [facility] ED 02/08 with generalized weakness and near syncopal event while in bathroom (but never had full syncope).  When his daughter helped him up, she noted that he had blood in his stool.  She was unable to tell if this was bright red or not as "it was mixed with liquid".  This was the only occurrence that daughter or pt are aware of.  They deny any hematochezia, hematemesis, hemoptysis.  He is on Eliquis on A.fib and Plavix for CAD.  Denies any ASA or NSAID use. Has never had GI bleed in the past.  Had 1 episode of vomiting earlier in the day but otherwise no N/V/D, abd pain.  In ED, Hgb noted to be 6.2.  He was given Eppie Gibson and GI was consulted and they plan to see him tonight.  Initially he had borderline BP which has since resolved.  In addition, he had 1 or 2 episodes of arrhythmia; therefore, PCCM was asked to admit.  In addition, he has had dry cough for several days.  No fevers/chills/sweats, myalgias.   PAST MEDICAL HISTORY :  He  has a past medical history of Anemia in chronic illness (04/13/2014); Coronary artery disease; and Diabetes mellitus without complication ([place]).  PAST SURGICAL HISTORY: He  has a past surgical history that includes Cardiac defibrillator placement; Cholecystectomy; and right leg ankle surgery.  Allergies  Allergen Reactions  . Grapefruit Extract Other (See Comments)    [facility] told him not to eat  . Amoxicillin Other (See Comments)    unknown    No current facility-administered medications on file prior to encounter.    Current Outpatient Prescriptions on File Prior to Encounter  Medication Sig  . amLODipine (NORVASC) 10 MG tablet Take 10 mg by  mouth daily.  Marland Kitchen gabapentin (NEURONTIN) 300 MG capsule Take 1 capsule (300 mg total) by mouth at bedtime. (Patient taking differently: Take 600 mg by mouth 3 (three) times daily. )  . insulin glargine (LANTUS) 100 UNIT/ML injection Inject 0.3 mLs (30 Units total) into the skin at bedtime. (Patient taking differently: Inject 20 Units into the skin at bedtime. )  . levothyroxine (SYNTHROID, LEVOTHROID) 88 MCG tablet Take 88 mcg by mouth daily before breakfast.  . lisinopril (PRINIVIL,ZESTRIL) 40 MG tablet Take 40 mg by mouth daily.  . sildenafil (VIAGRA) 50 MG tablet Take 50 mg by mouth daily as needed for erectile dysfunction.  Marland Kitchen terazosin (HYTRIN) 2 MG capsule Take 1 mg by mouth at bedtime.     FAMILY HISTORY:  His indicated that the status of his mother is unknown.    SOCIAL HISTORY: He  reports that he has never smoked. He has never used smokeless tobacco. He reports that he uses drugs, including Marijuana. He reports that he does not drink alcohol.  REVIEW OF SYSTEMS:   All negative; except for those that are bolded, which indicate positives.  Constitutional: weight loss, weight gain, night sweats, fevers, chills, fatigue, weakness.  HEENT: headaches, sore throat, sneezing, nasal congestion, post nasal drip, difficulty swallowing, tooth/dental problems, visual complaints, visual changes, ear aches. Neuro: difficulty with speech, weakness, numbness, ataxia, lightheadedness. CV:  chest pain, orthopnea, PND, swelling in lower  extremities, dizziness, palpitations, syncope.  Resp: cough, hemoptysis, dyspnea, wheezing. GI: heartburn, indigestion, abdominal pain, nausea, vomiting, diarrhea, constipation, change in bowel habits, loss of appetite, hematemesis, melena, hematochezia.  GU: dysuria, change in color of urine, urgency or frequency, flank pain, hematuria. MSK: joint pain or swelling, decreased range of motion. Psych: change in mood or affect, depression, anxiety, suicidal ideations,  homicidal ideations. Skin: rash, itching, bruising.   SUBJECTIVE:  Vitals stable.  Denies chest pain, SOB, N/V/D, abd pain.  VITAL SIGNS: BP 100/58   Pulse 60   Temp 97.5 F (36.4 C) (Oral)   Resp 11   Ht 6\' 1"  (1.854 m)   Wt 77.1 kg (170 lb)   SpO2 100%   BMI 22.43 kg/m   HEMODYNAMICS:    VENTILATOR SETTINGS:    INTAKE / OUTPUT: No intake/output data recorded.   PHYSICAL EXAMINATION: General: Adult male, appears older than stated age, in NAD. Neuro: A&O x 3, non-focal.  HEENT: [place]/AT. PERRL, sclerae anicteric. Cardiovascular: RRR, no 3/6 SEM. Lungs: Respirations even and unlabored.  CTA bilaterally, No W/R/R. Abdomen: BS x 4, soft, NT/ND.  Musculoskeletal: No gross deformities, no edema.  Skin: Intact, warm, no rashes.  LABS:  BMET  Recent Labs Lab 07/17/16 2229  NA 137  K 3.9  CL 108  CO2 20*  BUN 22*  CREATININE 1.81*  GLUCOSE 262*    Electrolytes  Recent Labs Lab 07/17/16 2229  CALCIUM 8.8*  MG 1.6*    CBC  Recent Labs Lab 07/17/16 2229  WBC 10.1  HGB 6.2*  HCT 19.0*  PLT 112*    Coag's  Recent Labs Lab 07/17/16 2229  INR 2.11    Sepsis Markers  Recent Labs Lab 07/17/16 2240  LATICACIDVEN 3.18*    ABG No results for input(s): PHART, PCO2ART, PO2ART in the last 168 hours.  Liver Enzymes  Recent Labs Lab 07/17/16 2229  AST 20  ALT 15*  ALKPHOS 41  BILITOT 0.7  ALBUMIN 3.0*    Cardiac Enzymes No results for input(s): TROPONINI, PROBNP in the last 168 hours.  Glucose  Recent Labs Lab 07/17/16 2225  GLUCAP 247*    Imaging Ct Head Wo Contrast  Result Date: 07/17/2016 CLINICAL DATA:  Altered mental status, lethargy. History of diabetes. EXAM: CT HEAD WITHOUT CONTRAST TECHNIQUE: Contiguous axial images were obtained from the base of the skull through the vertex without intravenous contrast. COMPARISON:  None available for comparison at time of study interpretation. FINDINGS: BRAIN: The ventricles and  sulci are normal for age. No intraparenchymal hemorrhage, mass effect nor midline shift. Patchy to confluent supratentorial white matter hypodensities. Old small LEFT cerebellar infarct. Old small LEFT frontal infarct. No acute large vascular territory infarcts. No abnormal extra-axial fluid collections. Basal cisterns are patent. VASCULAR: Moderate calcific atherosclerosis of the carotid siphons. SKULL: No skull fracture. No significant scalp soft tissue swelling. SINUSES/ORBITS: The mastoid air-cells and included paranasal sinuses are well-aerated.The included ocular globes and orbital contents are non-suspicious. OTHER: Poor dentition. IMPRESSION: No acute intracranial process. 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 Moderate to severe chronic small vessel ischemic disease. Electronically Signed   By: Elon Alas M.D.   On: 07/17/2016 22:54   Dg Chest Portable 1 View  Result Date: 07/17/2016 CLINICAL DATA:  Syncope EXAM: PORTABLE CHEST 1 VIEW COMPARISON:  10/15/2010 FINDINGS: Heart is enlarged but stable in appearance. Left-sided AICD device with right ventricular defibrillator lead and right atrial pacing wire noted. Focal bend in the  proximal right atrial pacing wire noted. Correlate clinically to ensure appropriate function. Aortic atherosclerosis without aneurysm is identified. Left costophrenic angle is excluded. The lungs however are clear. No pneumothorax or significant pleural effusion. No acute osseous abnormality. IMPRESSION: Stable cardiomegaly with aortic atherosclerosis. No acute pulmonary disease. Focal bend in the proximal right atrial pacing wire is suggested. Correlate to assure appropriate functioning of the apparatus. Electronically Signed   By: Ashley Royalty M.D.   On: 07/17/2016 22:41     STUDIES:  CT head 02/07 > negative for acute process.  Remove left cerebellar and left frontal lobe infarcts. CXR 02/07 > no acute process.  Possible right atrial  pacing wire may be bent.  CULTURES: None.  ANTIBIOTICS: None.  SIGNIFICANT EVENTS: 02/08 > admit.  LINES/TUBES: None.  DISCUSSION: 66 y.o. male admitted 02/08 with acute GI bleed, presumed upper.  On plavix and eliquis as outpatient; received KCentra in ED.  Had borderline BP initially so PCCM asked to admit.  BP now resolved and PBRC's not yet started.  ASSESSMENT / PLAN:  GASTROINTESTINAL A:   GI Bleed - unclear etiology at this point but presumed upper. Nutrition. P:   GI consulted by EDP. Start PPI gtt. NPO.  HEMATOLOGIC A:   Acute blood loss anemia - 2u PRBC ordered in ED.  S/p Kcentra in ED. Thrombocytopenia. VTE Prophylaxis. P:  H/H q6hrs. Transfuse for Hgb < 7. SCD's only. CBC and coags in AM.  PULMONARY A: Cough. P:   Albuterol PRN. Delsym q4hrs. CXR in AM.  CARDIOVASCULAR A:  Hypotension - now resolved. A.fib (on eliquis), CAD (on plavix). P:  Monitor hemodynamics. Hold preadmission eliquis, plavix, amlodipine, carvedilol, furosemide, lisinopril, terazosin.  RENAL A:   AKI - GI bleed + ACEi. Hypomagnesemia. Mild NAGMA. P:   2g Mag. NS @ 50. BMP in AM.  INFECTIOUS A:   No indication of infection. P:   Monitor clinically.  ENDOCRINE A:   DM. Hypothyroidism.   P:   SSI. Continue preadmission synthroid, change to IV formulation.  NEUROLOGIC A:   No acute issues. P:   No interventions required. Hold preadmission gabapentin.  Family updated: Daughter updated at bedside.  Will admit to SDU tonight and have asked TR[facility] to assume care starting 0700 today 07/18/16.  Interdisciplinary Family Meeting v Palliative Care Meeting:  Due by: 07/24/16.  CC time: 30 minutes.   Montey Hora, [place] [facility] Pager: [phone]  or [phone] 07/18/2016, 12:28 AM

## 2016-07-18 NOTE — ED Notes (Signed)
Pt attempting to urinate to provide specimen for UA

## 2016-07-19 ENCOUNTER — Inpatient Hospital Stay (HOSPITAL_COMMUNITY): Payer: Medicare Other | Admitting: Anesthesiology

## 2016-07-19 ENCOUNTER — Encounter (HOSPITAL_COMMUNITY): Payer: Self-pay | Admitting: *Deleted

## 2016-07-19 ENCOUNTER — Encounter (HOSPITAL_COMMUNITY)
Admission: EM | Disposition: A | Discharge status: Home / Self Care | Payer: Self-pay | Source: Home / Self Care | Attending: Internal Medicine

## 2016-07-19 ENCOUNTER — Inpatient Hospital Stay (HOSPITAL_COMMUNITY): Payer: Medicare Other

## 2016-07-19 DIAGNOSIS — K7030 Alcoholic cirrhosis of liver without ascites: Secondary | ICD-10-CM

## 2016-07-19 DIAGNOSIS — K2990 Gastroduodenitis, unspecified, without bleeding: Secondary | ICD-10-CM

## 2016-07-19 DIAGNOSIS — K2970 Gastritis, unspecified, without bleeding: Secondary | ICD-10-CM

## 2016-07-19 DIAGNOSIS — R079 Chest pain, unspecified: Secondary | ICD-10-CM

## 2016-07-19 DIAGNOSIS — D649 Anemia, unspecified: Secondary | ICD-10-CM

## 2016-07-19 HISTORY — PX: ESOPHAGOGASTRODUODENOSCOPY: SHX5428

## 2016-07-19 LAB — COMPREHENSIVE METABOLIC PANEL
ALBUMIN: 2.9 g/dL — AB (ref 3.5–5.0)
ALK PHOS: 36 U/L — AB (ref 38–126)
ALT: 14 U/L — AB (ref 17–63)
AST: 19 U/L (ref 15–41)
Anion gap: 10 (ref 5–15)
BUN: 16 mg/dL (ref 6–20)
CALCIUM: 8 mg/dL — AB (ref 8.9–10.3)
CHLORIDE: 110 mmol/L (ref 101–111)
CO2: 17 mmol/L — AB (ref 22–32)
CREATININE: 1.55 mg/dL — AB (ref 0.61–1.24)
GFR calc Af Amer: 52 mL/min — ABNORMAL LOW (ref >60)
GFR calc non Af Amer: 45 mL/min — ABNORMAL LOW (ref >60)
GLUCOSE: 193 mg/dL — AB (ref 65–99)
Potassium: 3.8 mmol/L (ref 3.5–5.1)
SODIUM: 137 mmol/L (ref 135–145)
Total Bilirubin: 1 mg/dL (ref 0.3–1.2)
Total Protein: 5.5 g/dL — ABNORMAL LOW (ref 6.5–8.1)

## 2016-07-19 LAB — CBC
HCT: 21.4 % — ABNORMAL LOW (ref 39.0–52.0)
HEMOGLOBIN: 7.3 g/dL — AB (ref 13.0–17.0)
MCH: 28.6 pg (ref 26.0–34.0)
MCHC: 34.1 g/dL (ref 30.0–36.0)
MCV: 83.9 fL (ref 78.0–100.0)
PLATELETS: 101 10*3/uL — AB (ref 150–400)
RBC: 2.55 MIL/uL — AB (ref 4.22–5.81)
RDW: 15.3 % (ref 11.5–15.5)
WBC: 9.6 10*3/uL (ref 4.0–10.5)

## 2016-07-19 LAB — LACTIC ACID, PLASMA
Lactic Acid, Venous: 1.4 mmol/L (ref 0.5–1.9)
Lactic Acid, Venous: 1.2 mmol/L (ref 0.5–1.9)
Lactic Acid, Venous: 1.7 mmol/L (ref 0.5–1.9)

## 2016-07-19 LAB — GLUCOSE, CAPILLARY
GLUCOSE-CAPILLARY: 117 mg/dL — AB (ref 65–99)
GLUCOSE-CAPILLARY: 182 mg/dL — AB (ref 65–99)
GLUCOSE-CAPILLARY: 192 mg/dL — AB (ref 65–99)
GLUCOSE-CAPILLARY: 204 mg/dL — AB (ref 65–99)
GLUCOSE-CAPILLARY: 225 mg/dL — AB (ref 65–99)
Glucose-Capillary: 145 mg/dL — ABNORMAL HIGH (ref 65–99)

## 2016-07-19 LAB — PREPARE RBC (CROSSMATCH)

## 2016-07-19 LAB — ECHOCARDIOGRAM COMPLETE
Height: 73 in
Weight: 2726.4 oz

## 2016-07-19 SURGERY — EGD (ESOPHAGOGASTRODUODENOSCOPY)
Anesthesia: Monitor Anesthesia Care

## 2016-07-19 MED ORDER — ONDANSETRON HCL 4 MG/2ML IJ SOLN: 4.0000 mg | Freq: Once | INTRAMUSCULAR | Status: DC | PRN

## 2016-07-19 MED ORDER — FENTANYL CITRATE (PF) 100 MCG/2ML IJ SOLN: 25.0000 ug | INTRAMUSCULAR | Status: DC | PRN

## 2016-07-19 MED ORDER — SODIUM CHLORIDE 0.9 % IV SOLN: Freq: Once | INTRAVENOUS | Status: DC

## 2016-07-19 MED ORDER — PROPOFOL 500 MG/50ML IV EMUL
INTRAVENOUS | Status: DC | PRN
  Administered 2016-07-19: 125 ug/kg/min via INTRAVENOUS

## 2016-07-19 MED ORDER — LACTATED RINGERS IV SOLN
INTRAVENOUS | Status: DC
  Administered 2016-07-19 (×2): via INTRAVENOUS

## 2016-07-19 MED ORDER — LIDOCAINE HCL (CARDIAC) 20 MG/ML IV SOLN
INTRAVENOUS | Status: DC | PRN
  Administered 2016-07-19: 60 mg via INTRATRACHEAL

## 2016-07-19 MED ORDER — SODIUM CHLORIDE 0.9 % IV BOLUS (SEPSIS): 500.0000 mL | Freq: Once | INTRAVENOUS | Status: DC

## 2016-07-19 MED ORDER — BUTAMBEN-TETRACAINE-BENZOCAINE 2-2-14 % EX AERO
INHALATION_SPRAY | CUTANEOUS | Status: DC | PRN
  Administered 2016-07-19: 2 via TOPICAL

## 2016-07-19 MED ORDER — SODIUM CHLORIDE 0.9 % IV BOLUS (SEPSIS): 250.0000 mL | Freq: Once | INTRAVENOUS | Status: DC

## 2016-07-19 MED ORDER — PANTOPRAZOLE SODIUM 40 MG PO TBEC
40.0000 mg | DELAYED_RELEASE_TABLET | Freq: Every day | ORAL | Status: DC
  Administered 2016-07-19 – 2016-07-21 (×3): 40 mg via ORAL
  Filled 2016-07-19 (×2): qty 1

## 2016-07-19 NOTE — H&P (View-Only) (Signed)
[facility] Consult: 10:18 AM 07/18/2016  LOS: 0 days    Referring Provider: Dr Allyson Sabal.   Primary Care Physician:  Followed at [facility] in [facility] Gastroenterologist:  At [facility]    Reason for Consultation:  Blood in stool    HPI: Anthony Mcfarland is a 66 y.o. male.  PMH HOCM. Non-obstructive CAD.  NSVT.  Afib, s/p ablation 03/2016, on Eliquis.  S/p ICD 2015.   Insulin requiring type 2 DM.  Hypothyroidism.  Htn.  CKD stage 3, followed by nephrology at the [facility].  Thrombocytopenia ([place] 54 in 04/2014)  Lap chole 2008.  Elevated LFTs 2015, ultrasound with normal looking liver.  Anemia of chronic disease.  Hepatitis C and completed antiretroviral therapy in late 2015.  Recent abdominal ultrasound showed full liver lesions and possible cirrhosis, this according to his daughter who is quite well informed. Takes both Eliquis and Plavix.    No PPI or H2 blocker at home   Patient admitted to the [facility] where he receives all his regular care, from 1/22 - 1/28.  Admitted with rapid A flutter, MI, cough. He underwent cardiac catheterization where he had "no significant blockages". His daughter says that he was slow to wake up from anesthesia.  Patient was initiated on Plavix, added to his Eliquis during this admission Patient spent the night undergoing sleep studies at the [facility] on Sunday, February 4.  After the ride home on Monday the patient became lightheaded after getting out of the car and fell. He still had something of a cough. Over the next couple of days he was more lethargic and wasn't eating as well.  Yesterday he complained of vague abdominal pain. Last evening the patient used the bathroom and passed watery stool which appear to have blood in it. Daughter does not describe it as tarry or melenic. No nausea or  vomiting. He was very weak and near syncopal.  Family brought him to [place].   Hgb 6.2.   MCV 80.  Platelets 112.  PT/INR: 24/2.1.  + AKI.   Troponin to 2.7.  Elevated lactate.  Administered Kcentra, PRBCs x 2.  Started on Protonix drip.   Has not had any further BMs after the one incident.   Patient's daughter says there were no known issues of anemia at the [place] 2 weeks ago. She is not aware of him ever having had blood transfusions. He used to drink regularly, she does not think he was a heavy drinker, but he quit at least 10 years ago. He doesn't use NSAIDs. He has had remote upper endoscopy. There were plans in place to set the patient up for colonoscopy in 03/2016 but other medical issues intervened and this test was never done.  Daughter also says that the nephrologist in [place] had recently commented on the patient's weight gain.   Past Medical History:  Diagnosis Date  . Anemia in chronic illness 04/13/2014  . Atrial flutter [facility])    s/p ablation 03/2016 and 06/2016 at [facility].    Marland Kitchen Coronary artery disease   .  Diabetes mellitus without complication ([place])   . Hepatitis C    This was treated in late 2015 with anti-viral medications overseen at [facility].    Past Surgical History:  Procedure Laterality Date  . CARDIAC DEFIBRILLATOR PLACEMENT    . CHOLECYSTECTOMY    . right leg ankle surgery      Prior to Admission medications   Medication Sig Start Date End Date Taking? Authorizing Provider  albuterol (PROVENTIL HFA;VENTOLIN HFA) 108 (90 Base) MCG/ACT inhaler Inhale 2 puffs into the lungs every 6 (six) hours as needed for wheezing or shortness of breath.   Yes Historical Provider, MD  amLODipine (NORVASC) 10 MG tablet Take 10 mg by mouth daily.   Yes Historical Provider, MD  apixaban (ELIQUIS) 5 MG TABS tablet Take 5 mg by mouth 2 (two) times daily.   Yes Historical Provider, MD  carvedilol (COREG) 25 MG tablet Take 25 mg by mouth 2 (two) times daily with a meal.   Yes Historical  Provider, MD  cholecalciferol (VITAMIN D) 1000 units tablet Take 2,000 Units by mouth daily.   Yes Historical Provider, MD  clopidogrel (PLAVIX) 75 MG tablet Take 75 mg by mouth daily.   Yes Historical Provider, MD  furosemide (LASIX) 20 MG tablet Take 20 mg by mouth daily.   Yes Historical Provider, MD  gabapentin (NEURONTIN) 300 MG capsule Take 1 capsule (300 mg total) by mouth at bedtime. Patient taking differently: Take 600 mg by mouth 3 (three) times daily.  04/14/14  Yes Ripudeep Krystal Eaton, MD  insulin glargine (LANTUS) 100 UNIT/ML injection Inject 0.3 mLs (30 Units total) into the skin at bedtime. Patient taking differently: Inject 20 Units into the skin at bedtime.  04/14/14  Yes Ripudeep Krystal Eaton, MD  levothyroxine (SYNTHROID, LEVOTHROID) 88 MCG tablet Take 88 mcg by mouth daily before breakfast.   Yes Historical Provider, MD  lisinopril (PRINIVIL,ZESTRIL) 40 MG tablet Take 40 mg by mouth daily.   Yes Historical Provider, MD  sildenafil (VIAGRA) 50 MG tablet Take 50 mg by mouth daily as needed for erectile dysfunction.   Yes Historical Provider, MD  terazosin (HYTRIN) 2 MG capsule Take 1 mg by mouth at bedtime.    Yes Historical Provider, MD    Scheduled Meds: . dextromethorphan  30 mg Oral BID  . insulin aspart  0-15 Units Subcutaneous Q4H  . levothyroxine  44 mcg Intravenous Daily  . [START ON 07/21/2016] pantoprazole  40 mg Intravenous Q12H   Infusions: . sodium chloride 10 mL/hr at 07/18/16 0843  . pantoprozole (PROTONIX) infusion 8 mg/hr (07/18/16 0530)   PRN Meds: sodium chloride, albuterol   Allergies as of 07/17/2016 - Review Complete 07/17/2016  Allergen Reaction Noted  . Grapefruit extract Other (See Comments) 11/28/2012  . Amoxicillin Other (See Comments) 04/11/2014    Family History  Problem Relation Age of Onset  . Cancer Mother     colon cancer    Social History   Social History  . Marital status: Single    Spouse name: N/A  . Number of children: N/A  .  Years of education: N/A   Occupational History  . Not on file.   Social History Main Topics  . Smoking status: Never Smoker  . Smokeless tobacco: Never Used  . Alcohol use No  . Drug use: Yes    Types: Marijuana  . Sexual activity: Not on file   Other Topics Concern  . Not on file   Social History Narrative  . No  narrative on file    REVIEW OF SYSTEMS: Constitutional:  Weakness. ENT:  No nose bleeds Pulm:  Stable dyspnea. Cough, periodically productive. CV:  No palpitations, no LE edema.  No chest pain GU:  No hematuria, no frequency GI:  No dysphagia. Recent anorexia. Heme:  Patient develops small bruises easily. No unusual bleeding from mouth, nose.   Transfusions:  None in past.   Neuro:  Takes gabapentin for peripheral neuropathic pain  Derm:  No itching, no rash or sores.  Endocrine:  No sweats or chills.  No polyuria or dysuria Immunization:  Not queried.  Travel:  None beyond local counties in last few months.    PHYSICAL EXAM: Vital signs in last 24 hours: Vitals:   07/18/16 0930 07/18/16 1000  BP: 135/66 120/62  Pulse: 68 65  Resp: 17 17  Temp:     Wt Readings from Last 3 Encounters:  07/18/16 75.6 kg (166 lb 10.7 oz)  04/12/14 69.4 kg (153 lb)    General: Chronically and acutely ill appearing AAM. Looks malnourished.  lethargic Head:  No asymmetry or swelling  Eyes:  No icterus or conj pallor Ears:  Not HOH  Nose:  No congestion or discharge  Mouth:  Tongue midline. Oral mucosa moist and clear. Dentition in fair repair. Neck:  No JVD, masses or thyromegaly. Lungs:  Clear bilaterally. Somewhat productive cough. No labored breathing. Heart:  RRR. Soft 1/6 systolic murmur. S1, S2 present. Abdomen:  Soft. Minimally distended. No HSM, no mass..   Rectal:  Stool is unformed, dark brown and FOBT positive.   Musc/Skeltl:  No gross joint deformities, swelling or erythema.  Musculature somewhat atrophic/wasted Extremities:  No CCE.  Neurologic:  No  asterixis. Moves all 4 limbs. Slight tremor in the hands. Oriented times 3. Lethargic but does wake up easily Skin:  No telangiectasia. Some purpura on the arms. Nodes:  No cervical adenopathy.   Psych:  Affect flat, not agitated.  Laconic.    LAB RESULTS:  Recent Labs  07/17/16 2229  WBC 10.1  HGB 6.2*  HCT 19.0*  PLT 112*   BMET Lab Results  Component Value Date   NA 140 07/18/2016   NA 137 07/17/2016   NA 135 (L) 04/14/2014   K 3.9 07/18/2016   K 3.9 07/17/2016   K 3.7 04/14/2014   CL 109 07/18/2016   CL 108 07/17/2016   CL 100 04/14/2014   CO2 22 07/18/2016   CO2 20 (L) 07/17/2016   CO2 22 04/14/2014   GLUCOSE 169 (H) 07/18/2016   GLUCOSE 262 (H) 07/17/2016   GLUCOSE 228 (H) 04/14/2014   BUN 22 (H) 07/18/2016   BUN 22 (H) 07/17/2016   BUN 12 04/14/2014   CREATININE 1.71 (H) 07/18/2016   CREATININE 1.81 (H) 07/17/2016   CREATININE 1.21 04/14/2014   CALCIUM 8.5 (L) 07/18/2016   CALCIUM 8.8 (L) 07/17/2016   CALCIUM 7.8 (L) 04/14/2014   LFT  Recent Labs  07/17/16 2229  PROT 5.9*  ALBUMIN 3.0*  AST 20  ALT 15*  ALKPHOS 41  BILITOT 0.7   PT/INR Lab Results  Component Value Date   INR 1.53 07/18/2016   INR 2.11 07/17/2016   INR 1.29 04/13/2014   Hepatitis Panel No results for input(s): HEPBSAG, HCVAB, HEPAIGM, HEPBIGM in the last 72 hours. C-Diff No components found for: CDIFF Lipase     Component Value Date/Time   LIPASE 13 07/17/2016 2229    Drugs of Abuse  No results found for:  LABOPIA, COCAINSCRNUR, LABBENZ, AMPHETMU, THCU, LABBARB   RADIOLOGY STUDIES: Ct Head Wo Contrast  Result Date: 07/17/2016 CLINICAL DATA:  Altered mental status, lethargy. History of diabetes. EXAM: CT HEAD WITHOUT CONTRAST TECHNIQUE: Contiguous axial images were obtained from the base of the skull through the vertex without intravenous contrast. COMPARISON:  None available for comparison at time of study interpretation. FINDINGS: BRAIN: The ventricles and sulci are  normal for age. No intraparenchymal hemorrhage, mass effect nor midline shift. Patchy to confluent supratentorial white matter hypodensities. Old small LEFT cerebellar infarct. Old small LEFT frontal infarct. No acute large vascular territory infarcts. No abnormal extra-axial fluid collections. Basal cisterns are patent. VASCULAR: Moderate calcific atherosclerosis of the carotid siphons. SKULL: No skull fracture. No significant scalp soft tissue swelling. SINUSES/ORBITS: The mastoid air-cells and included paranasal sinuses are well-aerated.The included ocular globes and orbital contents are non-suspicious. OTHER: Poor dentition. IMPRESSION: No acute intracranial process. 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 Moderate to severe chronic small vessel ischemic disease. Electronically Signed   By: Elon Alas M.D.   On: 07/17/2016 22:54   Dg Chest Portable 1 View  Result Date: 07/17/2016 CLINICAL DATA:  Syncope EXAM: PORTABLE CHEST 1 VIEW COMPARISON:  10/15/2010 FINDINGS: Heart is enlarged but stable in appearance. Left-sided AICD device with right ventricular defibrillator lead and right atrial pacing wire noted. Focal bend in the proximal right atrial pacing wire noted. Correlate clinically to ensure appropriate function. Aortic atherosclerosis without aneurysm is identified. Left costophrenic angle is excluded. The lungs however are clear. No pneumothorax or significant pleural effusion. No acute osseous abnormality. IMPRESSION: Stable cardiomegaly with aortic atherosclerosis. No acute pulmonary disease. Focal bend in the proximal right atrial pacing wire is suggested. Correlate to assure appropriate functioning of the apparatus. Electronically Signed   By: Ashley Royalty M.D.   On: 07/17/2016 22:41     IMPRESSION:   *  FOBT + stool in setting of both Plavix and Eliquis ( last doses were 2/7).  S/p K centra.   ? If this is upper vs lower GIB.  R/o portal gastropathy, varices.   R/o PUD.  R/o telangectasia.  R/o lower GI bleed: ischemic colitis in setting of recent rapid a fib/flutter, r/o neoplasia (no previous colonoscopy).    *  normocytic anemia.  S/p PRBC x2.    *  Hx Hep C, treated successfully in late 2015.  ? Cirrhosis.    *  Thrombocytopenia.  *  Elevated Troponins, ? Demand ischemia.  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    *  S/p sleep study 2/4, Tech program at test felt that this was positive for OSA but the patient and his family have not yet heard from the doctor with the official report    PLAN:     *  EGD set fpr 9 AM tomorrow.  Allow clears.    *  Switch to BID IV Protonix.   *  Abdominal ultrasound to assess for ascites, and cirrhosis. [place] 3-4 years ago showed relatively normal liver without signs of cirrhosis.   Azucena Freed  07/18/2016, 10:18 AM Pager: [phone]    ________________________________________________________________________  Velora Heckler GI MD note:  I personally examined the patient, reviewed the data and agree with the assessment and plan described above. Significant anemia with FOBT + stool in this man on two potent blood thinners, one of which was just started very  recently (plavix).  No significant, overt bleeding.  I agree with IV PPI for now.  No need for octreotide but getting up to date imaging of his liver to check for signs of cirrhosis given his previous Etoh abuse, Hep C is a good idea (we've ordered US).  Planning on EGD tomorrow.  If no obvious source of his anemia is found then he will probably need colonoscopy after more complete plavix washout (5 days from last dose yesterday will be on Monday).   Owens Loffler, MD [facility] Pager [phone]

## 2016-07-19 NOTE — Progress Notes (Signed)
[facility] PROGRESS NOTE  Anthony Mcfarland [id number] DOB: 23-Dec-1950 DOA: 07/17/2016   PCP: Pcp Not In System     Assessment/Plan: Active Problems:   Gastrointestinal hemorrhage   Ascites   Cirrhosis ([place])   Gastritis and gastroduodenitis   66 y.o. male with PMH as outlined below. He presented to [facility] ED 02/08 with generalized weakness and near syncopal event while in bathroom (but never had full syncope).  When his daughter helped him up, she noted that he had blood in his stool.  She was unable to tell if this was bright red or not as "it was mixed with liquid".  He is on Eliquis on A.fib and Plavix for CAD.  Denies any ASA or NSAID use. Has never had GI bleed in the past.  Had 1 episode of vomiting earlier in the day but otherwise no N/V/D, abd pain.  In ED, Hgb noted to be 6.2.  He was given Eppie Gibson and GI was consulted and they plan to see him tonight.  Initially he had borderline BP which has since resolved.  In addition, he had 1 or 2 episodes of arrhythmia; therefore, PCCM was asked to admit.   Assessment and plan Hematochezia     in the setting of   Eliquis ,S/p K centra.   We need to R/o portal gastropathy, varices.  R/o PUD.  R/o telangectasia.  R/o lower GI bleed: ischemic colitis in setting of recent rapid a fib/flutter, r/o neoplasia (no previous colonoscopy).   GI performed EGD  , EGD showed normal esophagus, normal stomach, no signs of portal gastropathy, patient would be scheduled for colonoscopy on Monday No need for octreotide but getting up to date imaging of his liver to check for signs of cirrhosis given his previous Etoh abuse, Hep C  probably need colonoscopy after more complete plavix washout , scheduled for Monday Has had 3 episodes of gross bloody BM     normocytic anemia. Initially hemoglobin was 6.2,  S/p PRBC x2.   hemoglobin trending down again, hemoglobin down to 7.3. Will transfuse another 2 units today due to ongoing hematochezia ,   *   Hx Hep C, treated successfully in late 2015.  ? Cirrhosis.   abdominal ultrasound to assess for ascites  *  Thrombocytopenia.112, follow   *  Elevated Troponins, ? Demand ischemia. 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    *  S/p sleep study 2/4, Tech program at test felt that this was positive for OSA but the patient and his family have not yet heard from the doctor with the official report  Hypomagnesemia-repleted, now 2.4  DVT prophylaxsis SCDs  Code Status:  Full code    Family Communication: Discussed in detail with the patient/ daughter, all imaging results, lab results explained to the patient   Disposition Plan:  Colonoscopy on Monday      Consultants:  Critical care  GI    Procedures:  None  Antibiotics: Anti-infectives    None         HPI/Subjective: Several episodes of hematochezia, GI aware   Objective: Vitals:   07/19/16 [id number] 07/19/16 0935 07/19/16 0945 07/19/16 0955  BP: Marland Kitchen)  157/70 (!) 123/53 (!) 119/51 (!) 109/58  Pulse: 73 83 81 84  Resp: 20 14 16 17   Temp: 97.9 F (36.6 C) 99 F (37.2 C)    TempSrc: Oral Oral    SpO2: 100% 100% 100% 100%  Weight:      Height:        Intake/Output Summary (Last 24 hours) at 07/19/16 1024 Last data filed at 07/19/16 [id number]  Gross per 24 hour  Intake          1104.75 ml  Output              701 ml  Net           403.75 ml    Exam:  Examination:  General exam: Appears calm and comfortable  Respiratory system: Clear to auscultation. Respiratory effort normal. Cardiovascular system: S1 & S2 heard, RRR. No JVD, murmurs, rubs, gallops or clicks. No  pedal edema. Gastrointestinal system: Abdomen is nondistended, soft and nontender. No organomegaly or masses felt. Normal bowel sounds heard. Central nervous system: Alert and oriented. No focal neurological deficits. Extremities: Symmetric 5 x 5 power. Skin: No rashes, lesions or ulcers Psychiatry: Judgement and insight appear normal. Mood & affect appropriate.     Data Reviewed: I have personally reviewed following labs and imaging studies  Micro Results Recent Results (from the past 240 hour(s))  MRSA PCR Screening     Status: None   Collection Time: 07/18/16  4:49 AM  Result Value Ref Range Status   MRSA by PCR NEGATIVE NEGATIVE Final    Comment:        The GeneXpert MRSA Assay (FDA approved for NASAL specimens only), is one component of a comprehensive MRSA colonization surveillance program. It is not intended to diagnose MRSA infection nor to guide or monitor treatment for MRSA infections.     Radiology Reports Ct Head Wo Contrast  Result Date: 07/17/2016 CLINICAL DATA:  Altered mental status, lethargy. History of diabetes. EXAM: CT HEAD WITHOUT CONTRAST TECHNIQUE: Contiguous axial images were obtained from the base of the skull through the vertex without intravenous contrast. COMPARISON:  None available for comparison at time of study interpretation. FINDINGS: BRAIN: The ventricles and sulci are normal for age. No intraparenchymal hemorrhage, mass effect nor midline shift. Patchy to confluent supratentorial white matter hypodensities. Old small LEFT cerebellar infarct. Old small LEFT frontal infarct. No acute large vascular territory infarcts. No abnormal extra-axial fluid collections. Basal cisterns are patent. VASCULAR: Moderate calcific atherosclerosis of the carotid siphons. SKULL: No skull fracture. No significant scalp soft tissue swelling. SINUSES/ORBITS: The mastoid air-cells and included paranasal sinuses are well-aerated.The included ocular globes and orbital contents  are non-suspicious. OTHER: Poor dentition. IMPRESSION: No acute intracranial process. 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 Moderate to severe chronic small vessel ischemic disease. Electronically Signed   By: Elon Alas M.D.   On: 07/17/2016 22:54   US Abdomen Limited  Result Date: 07/18/2016 CLINICAL DATA:  Patient with weakness.  GI bleed. EXAM: US ABDOMEN LIMITED - RIGHT UPPER QUADRANT COMPARISON:  Ultrasound 04/12/2014. FINDINGS: Gallbladder: Surgically absent Common bile duct: Diameter: 5 mm Liver: Mixed echogenicity and nodular in contour. IMPRESSION: Gallbladder surgically absent. Mildly nodular mixed echogenicity liver raising the possibility of cirrhosis. Electronically Signed   By: Lovey Newcomer M.D.   On: 07/18/2016 19:52   Dg Chest Portable 1 View  Result Date: 07/17/2016 CLINICAL DATA:  Syncope EXAM: PORTABLE CHEST 1 VIEW COMPARISON:  10/15/2010 FINDINGS:  Heart is enlarged but stable in appearance. Left-sided AICD device with right ventricular defibrillator lead and right atrial pacing wire noted. Focal bend in the proximal right atrial pacing wire noted. Correlate clinically to ensure appropriate function. Aortic atherosclerosis without aneurysm is identified. Left costophrenic angle is excluded. The lungs however are clear. No pneumothorax or significant pleural effusion. No acute osseous abnormality. IMPRESSION: Stable cardiomegaly with aortic atherosclerosis. No acute pulmonary disease. Focal bend in the proximal right atrial pacing wire is suggested. Correlate to assure appropriate functioning of the apparatus. Electronically Signed   By: Ashley Royalty M.D.   On: 07/17/2016 22:41     CBC  Recent Labs Lab 07/17/16 2229 07/18/16 0955 07/18/16 1848 07/19/16 0322  WBC 10.1  --   --  9.6  HGB 6.2* 8.2* 7.8* 7.3*  HCT 19.0* 24.9* 23.1* 21.4*  PLT 112*  --   --  101*  MCV 80.2  --   --  83.9  MCH 26.2  --   --  28.6  MCHC 32.6  --   --  34.1  RDW  15.5  --   --  15.3  LYMPHSABS 2.2  --   --   --   MONOABS 0.8  --   --   --   EOSABS 0.1  --   --   --   BASOSABS 0.0  --   --   --     Chemistries   Recent Labs Lab 07/17/16 2229 07/18/16 0427 07/19/16 0322  NA 137 140 137  K 3.9 3.9 3.8  CL 108 109 110  CO2 20* 22 17*  GLUCOSE 262* 169* 193*  BUN 22* 22* 16  CREATININE 1.81* 1.71* 1.55*  CALCIUM 8.8* 8.5* 8.0*  MG 1.6* 2.4  --   AST 20  --  19  ALT 15*  --  14*  ALKPHOS 41  --  36*  BILITOT 0.7  --  1.0   ------------------------------------------------------------------------------------------------------------------ estimated creatinine clearance is 51.3 mL/min (by C-G formula based on SCr of 1.55 mg/dL (H)). ------------------------------------------------------------------------------------------------------------------ No results for input(s): HGBA1C in the last 72 hours. ------------------------------------------------------------------------------------------------------------------ No results for input(s): CHOL, HDL, LDLCALC, TRIG, CHOLHDL, LDLDIRECT in the last 72 hours. ------------------------------------------------------------------------------------------------------------------ No results for input(s): TSH, T4TOTAL, T3FREE, THYROIDAB in the last 72 hours.  Invalid input(s): FREET3 ------------------------------------------------------------------------------------------------------------------ No results for input(s): VITAMINB12, FOLATE, FERRITIN, TIBC, IRON, RETICCTPCT in the last 72 hours.  Coagulation profile  Recent Labs Lab 07/17/16 2229 07/18/16 0427  INR 2.11 1.53    No results for input(s): DDIMER in the last 72 hours.  Cardiac Enzymes  Recent Labs Lab 07/18/16 0427 07/18/16 0955  TROPONINI 2.72* 2.48*   ------------------------------------------------------------------------------------------------------------------ Invalid input(s): POCBNP   CBG:  Recent Labs Lab  07/18/16 1701 07/18/16 2013 07/19/16 0000 07/19/16 0103 07/19/16 0408  GLUCAP 251* 257* 63* 145* 182*       Studies: Ct Head Wo Contrast  Result Date: 07/17/2016 CLINICAL DATA:  Altered mental status, lethargy. History of diabetes. EXAM: CT HEAD WITHOUT CONTRAST TECHNIQUE: Contiguous axial images were obtained from the base of the skull through the vertex without intravenous contrast. COMPARISON:  None available for comparison at time of study interpretation. FINDINGS: BRAIN: The ventricles and sulci are normal for age. No intraparenchymal hemorrhage, mass effect nor midline shift. Patchy to confluent supratentorial white matter hypodensities. Old small LEFT cerebellar infarct. Old small LEFT frontal infarct. No acute large vascular territory infarcts. No abnormal extra-axial fluid collections. Basal cisterns are patent. VASCULAR: Moderate calcific  atherosclerosis of the carotid siphons. SKULL: No skull fracture. No significant scalp soft tissue swelling. SINUSES/ORBITS: The mastoid air-cells and included paranasal sinuses are well-aerated.The included ocular globes and orbital contents are non-suspicious. OTHER: Poor dentition. IMPRESSION: No acute intracranial process. 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 Moderate to severe chronic small vessel ischemic disease. Electronically Signed   By: Elon Alas M.D.   On: 07/17/2016 22:54   US Abdomen Limited  Result Date: 07/18/2016 CLINICAL DATA:  Patient with weakness.  GI bleed. EXAM: US ABDOMEN LIMITED - RIGHT UPPER QUADRANT COMPARISON:  Ultrasound 04/12/2014. FINDINGS: Gallbladder: Surgically absent Common bile duct: Diameter: 5 mm Liver: Mixed echogenicity and nodular in contour. IMPRESSION: Gallbladder surgically absent. Mildly nodular mixed echogenicity liver raising the possibility of cirrhosis. Electronically Signed   By: Lovey Newcomer M.D.   On: 07/18/2016 19:52   Dg Chest Portable 1 View  Result Date:  07/17/2016 CLINICAL DATA:  Syncope EXAM: PORTABLE CHEST 1 VIEW COMPARISON:  10/15/2010 FINDINGS: Heart is enlarged but stable in appearance. Left-sided AICD device with right ventricular defibrillator lead and right atrial pacing wire noted. Focal bend in the proximal right atrial pacing wire noted. Correlate clinically to ensure appropriate function. Aortic atherosclerosis without aneurysm is identified. Left costophrenic angle is excluded. The lungs however are clear. No pneumothorax or significant pleural effusion. No acute osseous abnormality. IMPRESSION: Stable cardiomegaly with aortic atherosclerosis. No acute pulmonary disease. Focal bend in the proximal right atrial pacing wire is suggested. Correlate to assure appropriate functioning of the apparatus. Electronically Signed   By: Ashley Royalty M.D.   On: 07/17/2016 22:41      Lab Results  Component Value Date   HGBA1C 15.7 (H) 04/12/2014   Lab Results  Component Value Date   CREATININE 1.55 (H) 07/19/2016       Scheduled Meds: . [MAR Hold] dextromethorphan  30 mg Oral BID  . [MAR Hold] insulin aspart  0-15 Units Subcutaneous Q4H  . [MAR Hold] levothyroxine  44 mcg Intravenous Daily  . [MAR Hold] pantoprazole  40 mg Intravenous Q12H   Continuous Infusions: . sodium chloride 10 mL/hr at 07/18/16 0843  . sodium chloride Stopped (07/18/16 1145)  . lactated ringers 10 mL/hr at 07/19/16 0811     LOS: 1 day    Time spent: >30 MINS    [facility] Pager [phone]. If 7PM-7AM, please contact night-coverage at [email or web address], password [place] 07/19/2016, 10:24 AM  LOS: 1 day

## 2016-07-19 NOTE — Anesthesia Preprocedure Evaluation (Addendum)
Anesthesia Evaluation  Patient identified by MRN, date of birth, ID band Patient awake    Reviewed: Allergy & Precautions, NPO status , Patient's Chart, lab work & pertinent test results  Airway Mallampati: II  TM Distance: >3 FB Neck ROM: Full    Dental  (+) Teeth Intact   Pulmonary     + decreased breath sounds      Cardiovascular  Rhythm:Regular Rate:Normal     Neuro/Psych    GI/Hepatic   Endo/Other  diabetes  Renal/GU      Musculoskeletal   Abdominal   Peds  Hematology   Anesthesia Other Findings   Reproductive/Obstetrics                            Anesthesia Physical Anesthesia Plan  ASA: III  Anesthesia Plan: MAC   Post-op Pain Management:    Induction: Intravenous  Airway Management Planned: Natural Airway and Nasal Cannula  Additional Equipment:   Intra-op Plan:   Post-operative Plan:   Informed Consent: I have reviewed the patients History and Physical, chart, labs and discussed the procedure including the risks, benefits and alternatives for the proposed anesthesia with the patient or authorized representative who has indicated his/her understanding and acceptance.     Plan Discussed with: CRNA and Anesthesiologist  Anesthesia Plan Comments:         Anesthesia Quick Evaluation

## 2016-07-19 NOTE — Anesthesia Postprocedure Evaluation (Signed)
Anesthesia Post Note  Patient: Anthony Mcfarland  Procedure(s) Performed: Procedure(s) (LRB): ESOPHAGOGASTRODUODENOSCOPY (EGD) (N/A)  Patient location during evaluation: Endoscopy Anesthesia Type: MAC Level of consciousness: awake and alert Pain management: pain level controlled Vital Signs Assessment: post-procedure vital signs reviewed and stable Respiratory status: spontaneous breathing, nonlabored ventilation, respiratory function stable and patient connected to nasal cannula oxygen Cardiovascular status: stable and blood pressure returned to baseline Anesthetic complications: no       Last Vitals:  Vitals:   07/19/16 0945 07/19/16 0955  BP: (!) 119/51 (!) 109/58  Pulse: 81 84  Resp: 16 17  Temp:      Last Pain:  Vitals:   07/19/16 0935  TempSrc: Oral  PainSc:                  Catalina Gravel

## 2016-07-19 NOTE — Progress Notes (Signed)
CRITICAL VALUE ALERT  Critical value received:  Lactic Acid  Date of notification:  07/18/16  Time of notification:  2200  Critical value read back: yes  Nurse who received alert:  Jairo Ben, RN  MD notified (1st page):  Baltazar Najjar  Time of first page:  2313  MD notified (2nd page):  Time of second page:  Responding MD:  Baltazar Najjar  Time MD responded:  2320

## 2016-07-19 NOTE — Progress Notes (Signed)
Echocardiogram 2D Echocardiogram has been performed.  Anthony Mcfarland 07/19/2016, 2:40 PM

## 2016-07-19 NOTE — Transfer of Care (Signed)
Immediate Anesthesia Transfer of Care Note  Patient: Anthony Mcfarland  Procedure(s) Performed: Procedure(s): ESOPHAGOGASTRODUODENOSCOPY (EGD) (N/A)  Patient Location: Endoscopy Unit  Anesthesia Type:MAC  Level of Consciousness: awake, alert , oriented and patient cooperative  Airway & Oxygen Therapy: Patient Spontanous Breathing and Patient connected to nasal cannula oxygen  Post-op Assessment: Report given to RN and Post -op Vital signs reviewed and stable  Post vital signs: Reviewed and stable  Last Vitals:  Vitals:   07/19/16 0808 07/19/16 0935  BP: (!) 157/70 (!) 123/53  Pulse: 73 83  Resp: 20 14  Temp: 36.6 C 37.2 C    Last Pain:  Vitals:   07/19/16 0935  TempSrc: Oral  PainSc:          Complications: No apparent anesthesia complications

## 2016-07-19 NOTE — Op Note (Signed)
[facility] Patient Name: Anthony Mcfarland Procedure Date : 07/19/2016 MRN: [id number] Attending MD: Milus Banister , MD Date of Birth: May 17, 1951 CSN: [id number] Age: 66 Admit Type: Inpatient Procedure:                Upper GI endoscopy Indications:              Heme positive stool, signficant acute on chronic                            anemia, no over GI bleeding; [facility] recently                            added eliquis to his meds Providers:                Milus Banister, MD, Dortha Schwalbe RN, RN,                            Corliss Parish, Technician Referring MD:              Medicines:                Monitored Anesthesia Care Complications:            No immediate complications. Estimated blood loss:                            None. Estimated Blood Loss:     Estimated blood loss: none. Procedure:                Pre-Anesthesia Assessment:                           - Prior to the procedure, a History and Physical                            was performed, and patient medications and                            allergies were reviewed. The patient's tolerance of                            previous anesthesia was also reviewed. The risks                            and benefits of the procedure and the sedation                            options and risks were discussed with the patient.                            All questions were answered, and informed consent                            was obtained. Prior Anticoagulants: The patient has  taken Plavix (clopidogrel) and eliquis, last doses                            were 2 days prior to procedure. ASA Grade                            Assessment: III - A patient with severe systemic                            disease. After reviewing the risks and benefits,                            the patient was deemed in satisfactory condition to                            undergo the procedure.                       After obtaining informed consent, the endoscope was                            passed under direct vision. Throughout the                            procedure, the patient's blood pressure, pulse, and                            oxygen saturations were monitored continuously. The                            [id number]) scope was introduced through the                            mouth, and advanced to the second part of duodenum.                            The upper GI endoscopy was accomplished without                            difficulty. The patient tolerated the procedure                            well. Scope In: Scope Out: Findings:      The esophagus was normal.      The stomach was normal except for very mild, non-specific gastritis.      The examined duodenum was normal.      No fresh or old blood in the UGI tract. Impression:               - Normal esophagus.                           - Normal stomach except for very mild, non-specific  gastritis that would not account for his signficant                            anemia.                           - Normal examined duodenum.                           - No signs of portal hypertension. Moderate Sedation:      none Recommendation:           - Return patient to hospital ward for ongoing care.                           - Resume previous diet.                           - Continue present medications. Can change to oral                            PPI. No need for octreotide since no signs of                            portal hypertension                           - I recommend colonoscopy after plavix washout. The                            last dose was Wednesday and so he should be safe                            for colonoscopy on Monday. We will see him on                            Sunday to set it up. Please call, page sooner if he                            has  signficant, overt GI bleeding prior to then. Procedure Code(s):        --- Professional ---                           43 235, Esophagogastroduodenoscopy, flexible,                            transoral; diagnostic, including collection of                            specimen(s) by brushing or washing, when performed                            (separate procedure) Diagnosis Code(s):        --- Professional ---  R19.5, Other fecal abnormalities CPT copyright 2016 [employer] Medical Association. All rights reserved. The codes documented in this report are preliminary and upon coder review may  be revised to meet current compliance requirements. Milus Banister, MD 07/19/2016 9:35:15 AM This report has been signed electronically. Number of Addenda: 0

## 2016-07-19 NOTE — Interval H&P Note (Signed)
History and Physical Interval Note:  07/19/2016 8:51 AM  Anthony Mcfarland  has presented today for surgery, with the diagnosis of FOBT positive, anemia. Plavix, Eliquis on hold.  The various methods of treatment have been discussed with the patient and family. After consideration of risks, benefits and other options for treatment, the patient has consented to  Procedure(s): ESOPHAGOGASTRODUODENOSCOPY (EGD) (N/A) as a surgical intervention .  The patient's history has been reviewed, patient examined, no change in status, stable for surgery.  I have reviewed the patient's chart and labs.  Questions were answered to the patient's satisfaction.     Milus Banister

## 2016-07-20 DIAGNOSIS — K7031 Alcoholic cirrhosis of liver with ascites: Secondary | ICD-10-CM

## 2016-07-20 LAB — GLUCOSE, CAPILLARY
GLUCOSE-CAPILLARY: 117 mg/dL — AB (ref 65–99)
GLUCOSE-CAPILLARY: 184 mg/dL — AB (ref 65–99)
Glucose-Capillary: 163 mg/dL — ABNORMAL HIGH (ref 65–99)
Glucose-Capillary: 223 mg/dL — ABNORMAL HIGH (ref 65–99)
Glucose-Capillary: 142 mg/dL — ABNORMAL HIGH (ref 65–99)

## 2016-07-20 LAB — CBC
HEMATOCRIT: 25.1 % — AB (ref 39.0–52.0)
HEMOGLOBIN: 8.5 g/dL — AB (ref 13.0–17.0)
MCH: 28.3 pg (ref 26.0–34.0)
MCHC: 33.9 g/dL (ref 30.0–36.0)
MCV: 83.7 fL (ref 78.0–100.0)
Platelets: 91 10*3/uL — ABNORMAL LOW (ref 150–400)
RBC: 3 MIL/uL — AB (ref 4.22–5.81)
RDW: 15.5 % (ref 11.5–15.5)
WBC: 8.4 10*3/uL (ref 4.0–10.5)

## 2016-07-20 LAB — TYPE AND SCREEN
ABO/RH(D): B POS
Antibody Screen: NEGATIVE
UNIT DIVISION: 0
Unit division: 0
Unit division: 0
Unit division: 0

## 2016-07-20 LAB — BASIC METABOLIC PANEL
Anion gap: 13 (ref 5–15)
BUN: 12 mg/dL (ref 6–20)
CHLORIDE: 108 mmol/L (ref 101–111)
CO2: 16 mmol/L — ABNORMAL LOW (ref 22–32)
Calcium: 8.1 mg/dL — ABNORMAL LOW (ref 8.9–10.3)
Creatinine, Ser: 1.34 mg/dL — ABNORMAL HIGH (ref 0.61–1.24)
GFR calc Af Amer: >60 mL/min (ref >60)
GFR calc non Af Amer: 54 mL/min — ABNORMAL LOW (ref >60)
GLUCOSE: 162 mg/dL — AB (ref 65–99)
POTASSIUM: 3.8 mmol/L (ref 3.5–5.1)
Sodium: 137 mmol/L (ref 135–145)

## 2016-07-20 MED ORDER — MENTHOL 3 MG MT LOZG
1.0000 | LOZENGE | OROMUCOSAL | Status: DC | PRN
  Filled 2016-07-20: qty 9

## 2016-07-20 NOTE — Progress Notes (Addendum)
[facility] PROGRESS NOTE  Anthony Mcfarland [id number] DOB: 05/09/1951 DOA: 07/17/2016   PCP: Pcp Not In System     Assessment/Plan: Active Problems:   Gastrointestinal hemorrhage   Ascites   Cirrhosis ([place])   Gastritis and gastroduodenitis   66 y.o. male with PMH as outlined below. He presented to [facility] ED 02/08 with generalized weakness and near syncopal event while in bathroom (but never had full syncope).   patient admitted for hematochezia. She was unable to tell if this was bright red or not as "it was mixed with liquid".  He is on Eliquis on A.fib and Plavix for CAD.  Denies any ASA or NSAID use. Has never had GI bleed in the past.  Had 1 episode of vomiting earlier in the day but otherwise no N/V/D, abd pain.  In ED, Hgb noted to be 6.2.  He was given Eppie Gibson and GI was consulted and they plan to see him tonight.  Initially he had borderline BP which has since resolved.  In addition, he had 1 or 2 episodes of arrhythmia; therefore, PCCM was asked to admit.   Assessment and plan Hematochezia     in the setting of   Eliquis ,S/p K centra.   Patient underwent EGD to R/o portal gastropathy, varices.  R/o PUD.  R/o telangectasia.  R/o lower GI bleed: ischemic colitis in setting of recent rapid a fib/flutter, r/o neoplasia (no previous colonoscopy).   GI performed EGD 2/9 , EGD showed normal esophagus, normal stomach, no signs of portal gastropathy, patient would be scheduled for colonoscopy on Monday 2/12 No need for octreotide but getting up to date imaging of his liver to check for signs of cirrhosis given his previous Etoh abuse, Hep C . Right upper quadrant Ultrasound suggestive of cirrhosis probably need colonoscopy after more complete plavix washout , scheduled for Monday Patient had hematochezia yesterday but none after midnight      normocytic anemia. Initially hemoglobin was 6.2,  S/p PRBC x2 2/7.   hemoglobin trended  down again, hemoglobin down to 7.3.  Transfused 2 units 2/9 , hemoglobin now 8.5  *  Hx Hep C, treated successfully in late 2015.  ? Cirrhosis.   abdominal ultrasound to assess for ascites  *  Thrombocytopenia. continues to be low, may need platelet transfusion in the setting of dysfunctional platelets can be to Plavix and ongoing bleeding  *  Elevated Troponins, ? Demand ischemia. Hold preadmission eliquis, plavix, amlodipine, carvedilol, furosemide, lisinopril, terazosin 2-D echo shows EF of 65-70% with severe LVH. 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    *  S/p sleep study 2/4, Tech program at test felt that this was positive for OSA but the patient and his family have not yet heard from the doctor with the official report  Hypomagnesemia-repleted, now 2.4  DVT prophylaxsis SCDs  Code Status:  Full code    Family Communication: Discussed in detail with the patient/ daughter, all imaging results, lab results explained to the patient   Disposition Plan:  Colonoscopy on Monday      Consultants:  Critical care  GI    Procedures:  None  Antibiotics: Anti-infectives    None         HPI/Subjective: No hematochezia  today, complaining of a sore throat   Objective: Vitals:   07/19/16 2234 07/19/16 2251 07/20/16 0037 07/20/16 0410  BP: (!) 147/64 135/67 (!) 148/69 136/70  Pulse: 72 73 77 82  Resp: 20 20 18 18   Temp: 97.8 F (36.6 C) 98.2 F (36.8 C) 98.3 F (36.8 C) 98.1 F (36.7 C)  TempSrc: Oral Oral Oral Oral  SpO2: 100% 100% 100% 100%  Weight:    77.1 kg (170 lb)  Height:        Intake/Output Summary (Last 24 hours) at 07/20/16 0753 Last data filed at 07/20/16 [id number]  Gross per 24 hour  Intake             1195 ml  Output             2061 ml  Net              -866 ml    Exam:  Examination:  General exam: Appears calm and comfortable  Respiratory system: Clear to auscultation. Respiratory effort normal. Cardiovascular system: S1 & S2 heard, RRR. No JVD, murmurs, rubs, gallops or clicks. No pedal edema. Gastrointestinal system: Abdomen is nondistended, soft and nontender. No organomegaly or masses felt. Normal bowel sounds heard. Central nervous system: Alert and oriented. No focal neurological deficits. Extremities: Symmetric 5 x 5 power. Skin: No rashes, lesions or ulcers Psychiatry: Judgement and insight appear normal. Mood & affect appropriate.     Data Reviewed: I have personally reviewed following labs and imaging studies  Micro Results Recent Results (from the past 240 hour(s))  MRSA PCR Screening     Status: None   Collection Time: 07/18/16  4:49 AM  Result Value Ref Range Status   MRSA by PCR NEGATIVE NEGATIVE Final    Comment:        The GeneXpert MRSA Assay (FDA approved for NASAL specimens only), is one component of a comprehensive MRSA colonization surveillance program. It is not intended to diagnose MRSA infection nor to guide or monitor treatment for MRSA infections.     Radiology Reports Ct Head Wo Contrast  Result Date: 07/17/2016 CLINICAL DATA:  Altered mental status, lethargy. History of diabetes. EXAM: CT HEAD WITHOUT CONTRAST TECHNIQUE: Contiguous axial images were obtained from the base of the skull through the vertex without intravenous contrast. COMPARISON:  None available for comparison at time of study interpretation. FINDINGS: BRAIN: The ventricles and sulci are normal for age. No intraparenchymal hemorrhage, mass effect nor midline shift. Patchy to confluent supratentorial white matter hypodensities. Old small LEFT cerebellar infarct. Old small LEFT frontal infarct. No acute large vascular territory infarcts. No abnormal extra-axial fluid collections. Basal cisterns are patent. VASCULAR: Moderate  calcific atherosclerosis of the carotid siphons. SKULL: No skull fracture. No significant scalp soft tissue swelling. SINUSES/ORBITS: The mastoid air-cells and included paranasal sinuses are well-aerated.The included ocular globes and orbital contents are non-suspicious. OTHER: Poor dentition. IMPRESSION: No acute intracranial process. 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 Moderate to severe chronic small vessel ischemic disease. Electronically Signed   By: Elon Alas M.D.   On: 07/17/2016 22:54   US Abdomen Limited  Result Date: 07/18/2016 CLINICAL DATA:  Patient with weakness.  GI bleed. EXAM: US ABDOMEN LIMITED - RIGHT UPPER QUADRANT COMPARISON:  Ultrasound 04/12/2014. FINDINGS: Gallbladder: Surgically absent Common bile duct: Diameter: 5 mm Liver: Mixed echogenicity and nodular in contour. IMPRESSION: Gallbladder surgically absent. Mildly nodular mixed echogenicity liver raising the possibility of cirrhosis. Electronically Signed  By: Lovey Newcomer M.D.   On: 07/18/2016 19:52   Dg Chest Portable 1 View  Result Date: 07/17/2016 CLINICAL DATA:  Syncope EXAM: PORTABLE CHEST 1 VIEW COMPARISON:  10/15/2010 FINDINGS: Heart is enlarged but stable in appearance. Left-sided AICD device with right ventricular defibrillator lead and right atrial pacing wire noted. Focal bend in the proximal right atrial pacing wire noted. Correlate clinically to ensure appropriate function. Aortic atherosclerosis without aneurysm is identified. Left costophrenic angle is excluded. The lungs however are clear. No pneumothorax or significant pleural effusion. No acute osseous abnormality. IMPRESSION: Stable cardiomegaly with aortic atherosclerosis. No acute pulmonary disease. Focal bend in the proximal right atrial pacing wire is suggested. Correlate to assure appropriate functioning of the apparatus. Electronically Signed   By: Ashley Royalty M.D.   On: 07/17/2016 22:41     CBC  Recent Labs Lab  07/17/16 2229 07/18/16 0955 07/18/16 1848 07/19/16 0322 07/20/16 0312  WBC 10.1  --   --  9.6 8.4  HGB 6.2* 8.2* 7.8* 7.3* 8.5*  HCT 19.0* 24.9* 23.1* 21.4* 25.1*  PLT 112*  --   --  101* 91*  MCV 80.2  --   --  83.9 83.7  MCH 26.2  --   --  28.6 28.3  MCHC 32.6  --   --  34.1 33.9  RDW 15.5  --   --  15.3 15.5  LYMPHSABS 2.2  --   --   --   --   MONOABS 0.8  --   --   --   --   EOSABS 0.1  --   --   --   --   BASOSABS 0.0  --   --   --   --     Chemistries   Recent Labs Lab 07/17/16 2229 07/18/16 0427 07/19/16 0322 07/20/16 0312  NA 137 140 137 137  K 3.9 3.9 3.8 3.8  CL 108 109 110 108  CO2 20* 22 17* 16*  GLUCOSE 262* 169* 193* 162*  BUN 22* 22* 16 12  CREATININE 1.81* 1.71* 1.55* 1.34*  CALCIUM 8.8* 8.5* 8.0* 8.1*  MG 1.6* 2.4  --   --   AST 20  --  19  --   ALT 15*  --  14*  --   ALKPHOS 41  --  36*  --   BILITOT 0.7  --  1.0  --    ------------------------------------------------------------------------------------------------------------------ estimated creatinine clearance is 59.1 mL/min (by C-G formula based on SCr of 1.34 mg/dL (H)). ------------------------------------------------------------------------------------------------------------------ No results for input(s): HGBA1C in the last 72 hours. ------------------------------------------------------------------------------------------------------------------ No results for input(s): CHOL, HDL, LDLCALC, TRIG, CHOLHDL, LDLDIRECT in the last 72 hours. ------------------------------------------------------------------------------------------------------------------ No results for input(s): TSH, T4TOTAL, T3FREE, THYROIDAB in the last 72 hours.  Invalid input(s): FREET3 ------------------------------------------------------------------------------------------------------------------ No results for input(s): VITAMINB12, FOLATE, FERRITIN, TIBC, IRON, RETICCTPCT in the last 72 hours.  Coagulation  profile  Recent Labs Lab 07/17/16 2229 07/18/16 0427  INR 2.11 1.53    No results for input(s): DDIMER in the last 72 hours.  Cardiac Enzymes  Recent Labs Lab 07/18/16 0427 07/18/16 0955  TROPONINI 2.72* 2.48*   ------------------------------------------------------------------------------------------------------------------ Invalid input(s): POCBNP   CBG:  Recent Labs Lab 07/19/16 1111 07/19/16 1633 07/19/16 2033 07/19/16 2351 07/20/16 0406  GLUCAP 192* 225* 204* 117* 163*       Studies: US Abdomen Limited  Result Date: 07/18/2016 CLINICAL DATA:  Patient with weakness.  GI bleed. EXAM: US ABDOMEN LIMITED - RIGHT UPPER QUADRANT  COMPARISON:  Ultrasound 04/12/2014. FINDINGS: Gallbladder: Surgically absent Common bile duct: Diameter: 5 mm Liver: Mixed echogenicity and nodular in contour. IMPRESSION: Gallbladder surgically absent. Mildly nodular mixed echogenicity liver raising the possibility of cirrhosis. Electronically Signed   By: Lovey Newcomer M.D.   On: 07/18/2016 19:52      Lab Results  Component Value Date   HGBA1C 15.7 (H) 04/12/2014   Lab Results  Component Value Date   CREATININE 1.34 (H) 07/20/2016       Scheduled Meds: . sodium chloride   Intravenous Once  . dextromethorphan  30 mg Oral BID  . insulin aspart  0-15 Units Subcutaneous Q4H  . levothyroxine  44 mcg Intravenous Daily  . pantoprazole  40 mg Oral Q0600  . sodium chloride  500 mL Intravenous Once   Continuous Infusions: . sodium chloride 10 mL/hr at 07/20/16 0042  . lactated ringers 10 mL/hr at 07/19/16 0811     LOS: 2 days    Time spent: >30 MINS    [facility] Pager [phone]. If 7PM-7AM, please contact night-coverage at [email or web address], password [facility] 07/20/2016, 7:53 AM  LOS: 2 days

## 2016-07-21 LAB — GLUCOSE, CAPILLARY
GLUCOSE-CAPILLARY: 105 mg/dL — AB (ref 65–99)
GLUCOSE-CAPILLARY: 155 mg/dL — AB (ref 65–99)
GLUCOSE-CAPILLARY: 158 mg/dL — AB (ref 65–99)
GLUCOSE-CAPILLARY: 185 mg/dL — AB (ref 65–99)
Glucose-Capillary: 132 mg/dL — ABNORMAL HIGH (ref 65–99)
Glucose-Capillary: 224 mg/dL — ABNORMAL HIGH (ref 65–99)

## 2016-07-21 LAB — CBC
HCT: 26.8 % — ABNORMAL LOW (ref 39.0–52.0)
Hemoglobin: 9 g/dL — ABNORMAL LOW (ref 13.0–17.0)
MCH: 27.8 pg (ref 26.0–34.0)
MCHC: 33.6 g/dL (ref 30.0–36.0)
MCV: 82.7 fL (ref 78.0–100.0)
Platelets: 114 10*3/uL — ABNORMAL LOW (ref 150–400)
RBC: 3.24 MIL/uL — ABNORMAL LOW (ref 4.22–5.81)
RDW: 15.5 % (ref 11.5–15.5)
WBC: 8.9 10*3/uL (ref 4.0–10.5)

## 2016-07-21 LAB — COMPREHENSIVE METABOLIC PANEL
ALT: 14 U/L — ABNORMAL LOW (ref 17–63)
ANION GAP: 10 (ref 5–15)
AST: 21 U/L (ref 15–41)
Albumin: 3.1 g/dL — ABNORMAL LOW (ref 3.5–5.0)
Alkaline Phosphatase: 42 U/L (ref 38–126)
BUN: 7 mg/dL (ref 6–20)
CO2: 19 mmol/L — AB (ref 22–32)
Calcium: 8.6 mg/dL — ABNORMAL LOW (ref 8.9–10.3)
Chloride: 108 mmol/L (ref 101–111)
Creatinine, Ser: 1.45 mg/dL — ABNORMAL HIGH (ref 0.61–1.24)
GFR calc non Af Amer: 49 mL/min — ABNORMAL LOW (ref >60)
GFR, EST AFRICAN AMERICAN: 56 mL/min — AB (ref >60)
GLUCOSE: 98 mg/dL (ref 65–99)
POTASSIUM: 3.7 mmol/L (ref 3.5–5.1)
SODIUM: 137 mmol/L (ref 135–145)
TOTAL PROTEIN: 6 g/dL — AB (ref 6.5–8.1)
Total Bilirubin: 1.3 mg/dL — ABNORMAL HIGH (ref 0.3–1.2)

## 2016-07-21 MED ORDER — PEG-KCL-NACL-NASULF-NA ASC-C 100 G PO SOLR
0.5000 | Freq: Once | ORAL | Status: AC
  Administered 2016-07-21: 100 g via ORAL

## 2016-07-21 MED ORDER — PEG-KCL-NACL-NASULF-NA ASC-C 100 G PO SOLR
0.5000 | Freq: Once | ORAL | Status: AC
  Administered 2016-07-21: 100 g via ORAL
  Filled 2016-07-21: qty 1

## 2016-07-21 MED ORDER — PEG-KCL-NACL-NASULF-NA ASC-C 100 G PO SOLR: 1.0000 | Freq: Once | ORAL | Status: DC

## 2016-07-21 NOTE — Progress Notes (Signed)
    Progress Note   Subjective  Chief Complaint: Blood in stool  Pt is in good spirits this morning, found laying in his bed, he tells me that since he has been in the hospital he has not seen any further bleeding, he has continued with daily solid BM. He has been eating well. He denies abdominal pain or other GI symptoms.    Objective   Vital signs in last 24 hours: Temp:  [98.1 F (36.7 C)-99 F (37.2 C)] 99 F (37.2 C) (02/11 0414) Pulse Rate:  [72-86] 86 (02/11 0414) Resp:  [18] 18 (02/11 0414) BP: (141-153)/(70-74) 143/72 (02/11 0414) SpO2:  [99 %-100 %] 99 % (02/11 0414) Weight:  [163 lb (73.9 kg)] 163 lb (73.9 kg) (02/11 0414) Last BM Date: 07/20/16 General: African American male in NAD Heart:  Regular rate and rhythm;soft systolic 1/6 murmur Lungs: Respirations even and unlabored, lungs CTA bilaterally Abdomen:  Soft, nontender and nondistended. Normal bowel sounds. Extremities:  Without edema. Neurologic:  Alert and oriented,  grossly normal neurologically. Psych:  Cooperative. Normal mood and affect.  Intake/Output from previous day: 02/10 0701 - 02/11 0700 In: -  Out: 1100 [Urine:1100]  Lab Results:  Recent Labs  07/19/16 0322 07/20/16 0312 07/21/16 0205  WBC 9.6 8.4 8.9  HGB 7.3* 8.5* 9.0*  HCT 21.4* 25.1* 26.8*  PLT 101* 91* 114*   BMET  Recent Labs  07/19/16 0322 07/20/16 0312 07/21/16 0205  NA 137 137 137  K 3.8 3.8 3.7  CL 110 108 108  CO2 17* 16* 19*  GLUCOSE 193* 162* 98  BUN 16 12 7   CREATININE 1.55* 1.34* 1.45*  CALCIUM 8.0* 8.1* 8.6*   LFT  Recent Labs  07/21/16 0205  PROT 6.0*  ALBUMIN 3.1*  AST 21  ALT 14*  ALKPHOS 42  BILITOT 1.3*    Assessment / Plan:    Assessment: 1. FOBT positive stool in setting of both Plavix an Eliquis: EGD 07/19/16 negative for etiology 2. Normocytic anemia: pt transfused 2 u prbcs 07/19/16 3. Thrombocytopenia 4. History of hep C: Treated successfully in late 2015; u/s 07/18/16-suggestive of  cirhosis  Plan: 1. Scheduling colonoscopy for tomorrow with Dr. Silverio Decamp. Discussed risks, benefits, limitations and alternatives the patient agrees to proceed. 2. Patient will remain on clear liquids for the rest of today and nothing by mouth after midnight, prep has been ordered. 3. Continue supportive measures  Thank you for your kind consultation, we will continue to follow    LOS: 3 days   Levin Erp  07/21/2016, 9:05 AM  Pager # [phone]   ________________________________________________________________________  [facility] Heckler [facility] MD note:  I personally examined the patient, reviewed the data and agree with the assessment and plan described above.  He's feeling well and understands the plan for colonoscopy tomorrow for GI bleeding (dark and red mixed blood) in setting of eliquis and (recently added) plavix.     Owens Loffler, MD [facility] Pager [phone]

## 2016-07-21 NOTE — Progress Notes (Signed)
[facility] PROGRESS NOTE  Anthony Mcfarland [id number] DOB: 08-25-1950 DOA: 07/17/2016   PCP: Pcp Not In System     Assessment/Plan: Active Problems:   Gastrointestinal hemorrhage   Ascites   Cirrhosis ([place])   Gastritis and gastroduodenitis   66 y.o. male with PMH as outlined below. He presented to [facility] ED 02/08 with generalized weakness and near syncopal event while in bathroom (but never had full syncope).   patient admitted for hematochezia. She was unable to tell if this was bright red or not as "it was mixed with liquid".  He is on Eliquis on A.fib and Plavix for CAD.  Denies any ASA or NSAID use. Has never had GI bleed in the past.  Had 1 episode of vomiting earlier in the day but otherwise no N/V/D, abd pain.  In ED, Hgb noted to be 6.2.  He was given Eppie Gibson and GI was consulted and they plan to see him tonight.  Initially he had borderline BP which has since resolved.  In addition, he had 1 or 2 episodes of arrhythmia; therefore, PCCM was asked to admit.   Assessment and plan Hematochezia   in the setting of   Eliquis ,S/p K centra.   Patient underwent EGD to R/o portal gastropathy, varices.  R/o PUD.  R/o telangectasia.  R/o lower GI bleed: ischemic colitis in setting of recent rapid a fib/flutter, r/o neoplasia (no previous colonoscopy).   GI performed EGD 2/9 , EGD showed normal esophagus, normal stomach, no signs of portal gastropathy, patient would be scheduled for colonoscopy on Monday 2/12  didn't receive octreotide   given his previous Etoh abuse, Hep C . Right upper quadrant Ultrasound  Was done suggestive of cirrhosis needs colonoscopy after  complete plavix washout , scheduled for Monday Hematochezia seems to have resolved      normocytic anemia. Improving. Initially hemoglobin was 6.2,  S/p PRBC x2 2/7.   hemoglobin trended down again, Transfused 2 units again on 2/9 , hemoglobin now 8.5.>9.0  *  Hx Hep C, treated successfully in late 2015.  ?  Cirrhosis.   abdominal ultrasound to assess for ascites  *  Thrombocytopenia. continues to be low but improving, may need platelet transfusion in the setting of dysfunctional platelets . Monitor platelet count closely  *  Elevated Troponins, ? Demand ischemia. Held preadmission eliquis, plavix, amlodipine, carvedilol, furosemide, lisinopril, terazosin 2-D echo shows EF of 65-70% with severe LVH. 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    *  S/p sleep study 2/4, Tech program at test felt that this was positive for OSA but the patient and his family have not yet heard from the doctor with the official report  Hypomagnesemia-repleted, now 2.4  DVT prophylaxsis SCDs  Code Status:  Full code    Family Communication: Discussed in detail with the patient/ daughter, all imaging results, lab results explained to the patient   Disposition Plan:  Colonoscopy on Monday      Consultants:  Critical care  GI    Procedures:  None  Antibiotics: Anti-infectives    None         HPI/Subjective: No bleeding overnight   Objective: Vitals:   07/20/16 0410 07/20/16 1406 07/20/16 2028 07/21/16 0414  BP: 136/70 Marland Kitchen)  153/74 (!) 141/70 (!) 143/72  Pulse: 82 78 72 86  Resp: 18 18 18 18   Temp: 98.1 F (36.7 C) 98.1 F (36.7 C) 98.5 F (36.9 C) 99 F (37.2 C)  TempSrc: Oral Oral Oral Oral  SpO2: 100% 100% 100% 99%  Weight: 77.1 kg (170 lb)   73.9 kg (163 lb)  Height:        Intake/Output Summary (Last 24 hours) at 07/21/16 0759 Last data filed at 07/21/16 0414  Gross per 24 hour  Intake                0 ml  Output             1100 ml  Net            -1100 ml    Exam:  Examination:  General exam: Appears calm and comfortable   Respiratory system: Clear to auscultation. Respiratory effort normal. Cardiovascular system: S1 & S2 heard, RRR. No JVD, murmurs, rubs, gallops or clicks. No pedal edema. Gastrointestinal system: Abdomen is nondistended, soft and nontender. No organomegaly or masses felt. Normal bowel sounds heard. Central nervous system: Alert and oriented. No focal neurological deficits. Extremities: Symmetric 5 x 5 power. Skin: No rashes, lesions or ulcers Psychiatry: Judgement and insight appear normal. Mood & affect appropriate.     Data Reviewed: I have personally reviewed following labs and imaging studies  Micro Results Recent Results (from the past 240 hour(s))  MRSA PCR Screening     Status: None   Collection Time: 07/18/16  4:49 AM  Result Value Ref Range Status   MRSA by PCR NEGATIVE NEGATIVE Final    Comment:        The GeneXpert MRSA Assay (FDA approved for NASAL specimens only), is one component of a comprehensive MRSA colonization surveillance program. It is not intended to diagnose MRSA infection nor to guide or monitor treatment for MRSA infections.     Radiology Reports Ct Head Wo Contrast  Result Date: 07/17/2016 CLINICAL DATA:  Altered mental status, lethargy. History of diabetes. EXAM: CT HEAD WITHOUT CONTRAST TECHNIQUE: Contiguous axial images were obtained from the base of the skull through the vertex without intravenous contrast. COMPARISON:  None available for comparison at time of study interpretation. FINDINGS: BRAIN: The ventricles and sulci are normal for age. No intraparenchymal hemorrhage, mass effect nor midline shift. Patchy to confluent supratentorial white matter hypodensities. Old small LEFT cerebellar infarct. Old small LEFT frontal infarct. No acute large vascular territory infarcts. No abnormal extra-axial fluid collections. Basal cisterns are patent. VASCULAR: Moderate calcific atherosclerosis of the carotid siphons. SKULL: No skull fracture. No significant  scalp soft tissue swelling. SINUSES/ORBITS: The mastoid air-cells and included paranasal sinuses are well-aerated.The included ocular globes and orbital contents are non-suspicious. OTHER: Poor dentition. IMPRESSION: No acute intracranial process. 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 Moderate to severe chronic small vessel ischemic disease. Electronically Signed   By: Elon Alas M.D.   On: 07/17/2016 22:54   US Abdomen Limited  Result Date: 07/18/2016 CLINICAL DATA:  Patient with weakness.  GI bleed. EXAM: US ABDOMEN LIMITED - RIGHT UPPER QUADRANT COMPARISON:  Ultrasound 04/12/2014. FINDINGS: Gallbladder: Surgically absent Common bile duct: Diameter: 5 mm Liver: Mixed echogenicity and nodular in contour. IMPRESSION: Gallbladder surgically absent. Mildly nodular mixed echogenicity liver raising the possibility of cirrhosis. Electronically Signed   By: Lovey Newcomer M.D.   On: 07/18/2016 19:52   Dg Chest Portable 1 View  Result Date: 07/17/2016 CLINICAL DATA:  Syncope EXAM: PORTABLE CHEST 1 VIEW COMPARISON:  10/15/2010 FINDINGS: Heart is enlarged but stable in appearance. Left-sided AICD device with right ventricular defibrillator lead and right atrial pacing wire noted. Focal bend in the proximal right atrial pacing wire noted. Correlate clinically to ensure appropriate function. Aortic atherosclerosis without aneurysm is identified. Left costophrenic angle is excluded. The lungs however are clear. No pneumothorax or significant pleural effusion. No acute osseous abnormality. IMPRESSION: Stable cardiomegaly with aortic atherosclerosis. No acute pulmonary disease. Focal bend in the proximal right atrial pacing wire is suggested. Correlate to assure appropriate functioning of the apparatus. Electronically Signed   By: Ashley Royalty M.D.   On: 07/17/2016 22:41     CBC  Recent Labs Lab 07/17/16 2229 07/18/16 0955 07/18/16 1848 07/19/16 0322 07/20/16 0312 07/21/16 0205   WBC 10.1  --   --  9.6 8.4 8.9  HGB 6.2* 8.2* 7.8* 7.3* 8.5* 9.0*  HCT 19.0* 24.9* 23.1* 21.4* 25.1* 26.8*  PLT 112*  --   --  101* 91* 114*  MCV 80.2  --   --  83.9 83.7 82.7  MCH 26.2  --   --  28.6 28.3 27.8  MCHC 32.6  --   --  34.1 33.9 33.6  RDW 15.5  --   --  15.3 15.5 15.5  LYMPHSABS 2.2  --   --   --   --   --   MONOABS 0.8  --   --   --   --   --   EOSABS 0.1  --   --   --   --   --   BASOSABS 0.0  --   --   --   --   --     Chemistries   Recent Labs Lab 07/17/16 2229 07/18/16 0427 07/19/16 0322 07/20/16 0312 07/21/16 0205  NA 137 140 137 137 137  K 3.9 3.9 3.8 3.8 3.7  CL 108 109 110 108 108  CO2 20* 22 17* 16* 19*  GLUCOSE 262* 169* 193* 162* 98  BUN 22* 22* 16 12 7   CREATININE 1.81* 1.71* 1.55* 1.34* 1.45*  CALCIUM 8.8* 8.5* 8.0* 8.1* 8.6*  MG 1.6* 2.4  --   --   --   AST 20  --  19  --  21  ALT 15*  --  14*  --  14*  ALKPHOS 41  --  36*  --  42  BILITOT 0.7  --  1.0  --  1.3*   ------------------------------------------------------------------------------------------------------------------ estimated creatinine clearance is 52.4 mL/min (by C-G formula based on SCr of 1.45 mg/dL (H)). ------------------------------------------------------------------------------------------------------------------ No results for input(s): HGBA1C in the last 72 hours. ------------------------------------------------------------------------------------------------------------------ No results for input(s): CHOL, HDL, LDLCALC, TRIG, CHOLHDL, LDLDIRECT in the last 72 hours. ------------------------------------------------------------------------------------------------------------------ No results for input(s): TSH, T4TOTAL, T3FREE, THYROIDAB in the last 72 hours.  Invalid input(s): FREET3 ------------------------------------------------------------------------------------------------------------------ No results for input(s): VITAMINB12, FOLATE, FERRITIN, TIBC, IRON,  RETICCTPCT in the last 72 hours.  Coagulation profile  Recent Labs Lab 07/17/16 2229 07/18/16 0427  INR 2.11 1.53    No results for input(s): DDIMER in the last 72 hours.  Cardiac Enzymes  Recent Labs Lab 07/18/16 0427 07/18/16 0955  TROPONINI 2.72* 2.48*   ------------------------------------------------------------------------------------------------------------------ Invalid input(s): POCBNP   CBG:  Recent Labs Lab 07/20/16 1105 07/20/16 1632 07/20/16 2025 07/21/16 0026 07/21/16 0410  GLUCAP 117* 184* 142* 132* 105*       Studies: No results found.  Lab Results  Component Value Date   HGBA1C 15.7 (H) 04/12/2014   Lab Results  Component Value Date   CREATININE 1.45 (H) 07/21/2016       Scheduled Meds: . sodium chloride   Intravenous Once  . dextromethorphan  30 mg Oral BID  . insulin aspart  0-15 Units Subcutaneous Q4H  . levothyroxine  44 mcg Intravenous Daily  . pantoprazole  40 mg Oral Q0600  . sodium chloride  500 mL Intravenous Once   Continuous Infusions: . sodium chloride 10 mL/hr at 07/20/16 0042  . lactated ringers 10 mL/hr at 07/19/16 0811     LOS: 3 days    Time spent: >30 MINS    [facility] Pager [phone]. If 7PM-7AM, please contact night-coverage at [email or web address], password [facility] 07/21/2016, 7:59 AM  LOS: 3 days

## 2016-07-22 ENCOUNTER — Encounter (HOSPITAL_COMMUNITY)
Admission: EM | Disposition: A | Discharge status: Home / Self Care | Payer: Self-pay | Source: Home / Self Care | Attending: Internal Medicine

## 2016-07-22 ENCOUNTER — Inpatient Hospital Stay (HOSPITAL_COMMUNITY): Payer: Medicare Other | Admitting: Certified Registered Nurse Anesthetist

## 2016-07-22 ENCOUNTER — Encounter (HOSPITAL_COMMUNITY): Payer: Self-pay | Admitting: Certified Registered Nurse Anesthetist

## 2016-07-22 DIAGNOSIS — K51011 Ulcerative (chronic) pancolitis with rectal bleeding: Secondary | ICD-10-CM

## 2016-07-22 DIAGNOSIS — D123 Benign neoplasm of transverse colon: Secondary | ICD-10-CM

## 2016-07-22 DIAGNOSIS — K635 Polyp of colon: Secondary | ICD-10-CM

## 2016-07-22 DIAGNOSIS — K7011 Alcoholic hepatitis with ascites: Secondary | ICD-10-CM

## 2016-07-22 DIAGNOSIS — K7460 Unspecified cirrhosis of liver: Secondary | ICD-10-CM

## 2016-07-22 DIAGNOSIS — K745 Biliary cirrhosis, unspecified: Secondary | ICD-10-CM

## 2016-07-22 HISTORY — PX: COLONOSCOPY: SHX5424

## 2016-07-22 LAB — GLUCOSE, CAPILLARY
Glucose-Capillary: 140 mg/dL — ABNORMAL HIGH (ref 65–99)
Glucose-Capillary: 62 mg/dL — ABNORMAL LOW (ref 65–99)
Glucose-Capillary: 132 mg/dL — ABNORMAL HIGH (ref 65–99)
Glucose-Capillary: 148 mg/dL — ABNORMAL HIGH (ref 65–99)

## 2016-07-22 LAB — COMPREHENSIVE METABOLIC PANEL
ALBUMIN: 3.2 g/dL — AB (ref 3.5–5.0)
ALK PHOS: 43 U/L (ref 38–126)
ALT: 12 U/L — AB (ref 17–63)
AST: 21 U/L (ref 15–41)
Anion gap: 7 (ref 5–15)
BILIRUBIN TOTAL: 1.2 mg/dL (ref 0.3–1.2)
BUN: 5 mg/dL — AB (ref 6–20)
CALCIUM: 8.8 mg/dL — AB (ref 8.9–10.3)
CO2: 21 mmol/L — ABNORMAL LOW (ref 22–32)
CREATININE: 1.48 mg/dL — AB (ref 0.61–1.24)
Chloride: 113 mmol/L — ABNORMAL HIGH (ref 101–111)
GFR calc Af Amer: 55 mL/min — ABNORMAL LOW (ref >60)
GFR calc non Af Amer: 48 mL/min — ABNORMAL LOW (ref >60)
GLUCOSE: 45 mg/dL — AB (ref 65–99)
Potassium: 3.6 mmol/L (ref 3.5–5.1)
Sodium: 141 mmol/L (ref 135–145)
TOTAL PROTEIN: 6.2 g/dL — AB (ref 6.5–8.1)

## 2016-07-22 LAB — CBC
HEMATOCRIT: 27.3 % — AB (ref 39.0–52.0)
HEMOGLOBIN: 9.1 g/dL — AB (ref 13.0–17.0)
MCH: 27.8 pg (ref 26.0–34.0)
MCHC: 33.3 g/dL (ref 30.0–36.0)
MCV: 83.5 fL (ref 78.0–100.0)
Platelets: 137 10*3/uL — ABNORMAL LOW (ref 150–400)
RBC: 3.27 MIL/uL — ABNORMAL LOW (ref 4.22–5.81)
RDW: 15.5 % (ref 11.5–15.5)
WBC: 9.2 10*3/uL (ref 4.0–10.5)

## 2016-07-22 SURGERY — COLONOSCOPY
Anesthesia: Monitor Anesthesia Care

## 2016-07-22 MED ORDER — DEXTROSE 50 % IV SOLN
INTRAVENOUS | Status: AC
  Administered 2016-07-22: 25 mL
  Filled 2016-07-22: qty 50

## 2016-07-22 MED ORDER — SODIUM CHLORIDE 0.9 % IV SOLN
INTRAVENOUS | Status: DC
  Administered 2016-07-22: 06:00:00 via INTRAVENOUS

## 2016-07-22 MED ORDER — PANTOPRAZOLE SODIUM 40 MG PO TBEC: 40.0000 mg | DELAYED_RELEASE_TABLET | Freq: Every day | ORAL | 1 refills | Status: DC

## 2016-07-22 MED ORDER — PROPOFOL 500 MG/50ML IV EMUL
INTRAVENOUS | Status: DC | PRN
  Administered 2016-07-22: 12:00:00 via INTRAVENOUS
  Administered 2016-07-22: 120 ug/kg/min via INTRAVENOUS

## 2016-07-22 MED ORDER — LIDOCAINE HCL (CARDIAC) 20 MG/ML IV SOLN
INTRAVENOUS | Status: DC | PRN
  Administered 2016-07-22: 60 mg via INTRATRACHEAL

## 2016-07-22 MED ORDER — PROPOFOL 10 MG/ML IV BOLUS
INTRAVENOUS | Status: DC | PRN
  Administered 2016-07-22 (×3): 20 mg via INTRAVENOUS
  Administered 2016-07-22: 15 mg via INTRAVENOUS

## 2016-07-22 MED ORDER — CLOPIDOGREL BISULFATE 75 MG PO TABS
75.0000 mg | ORAL_TABLET | Freq: Every day | ORAL | 0 refills | Status: AC
Start: 2016-07-29 — End: ?

## 2016-07-22 MED ORDER — APIXABAN 5 MG PO TABS: 5.0000 mg | ORAL_TABLET | Freq: Two times a day (BID) | ORAL | 0 refills | Status: DC

## 2016-07-22 NOTE — H&P (View-Only) (Signed)
    Progress Note   Subjective  Chief Complaint: Blood in stool  Pt is in good spirits this morning, found laying in his bed, he tells me that since he has been in the hospital he has not seen any further bleeding, he has continued with daily solid BM. He has been eating well. He denies abdominal pain or other GI symptoms.    Objective   Vital signs in last 24 hours: Temp:  [98.1 F (36.7 C)-99 F (37.2 C)] 99 F (37.2 C) (02/11 0414) Pulse Rate:  [72-86] 86 (02/11 0414) Resp:  [18] 18 (02/11 0414) BP: (141-153)/(70-74) 143/72 (02/11 0414) SpO2:  [99 %-100 %] 99 % (02/11 0414) Weight:  [163 lb (73.9 kg)] 163 lb (73.9 kg) (02/11 0414) Last BM Date: 07/20/16 General: African American male in NAD Heart:  Regular rate and rhythm;soft systolic 1/6 murmur Lungs: Respirations even and unlabored, lungs CTA bilaterally Abdomen:  Soft, nontender and nondistended. Normal bowel sounds. Extremities:  Without edema. Neurologic:  Alert and oriented,  grossly normal neurologically. Psych:  Cooperative. Normal mood and affect.  Intake/Output from previous day: 02/10 0701 - 02/11 0700 In: -  Out: 1100 [Urine:1100]  Lab Results:  Recent Labs  07/19/16 0322 07/20/16 0312 07/21/16 0205  WBC 9.6 8.4 8.9  HGB 7.3* 8.5* 9.0*  HCT 21.4* 25.1* 26.8*  PLT 101* 91* 114*   BMET  Recent Labs  07/19/16 0322 07/20/16 0312 07/21/16 0205  NA 137 137 137  K 3.8 3.8 3.7  CL 110 108 108  CO2 17* 16* 19*  GLUCOSE 193* 162* 98  BUN 16 12 7   CREATININE 1.55* 1.34* 1.45*  CALCIUM 8.0* 8.1* 8.6*   LFT  Recent Labs  07/21/16 0205  PROT 6.0*  ALBUMIN 3.1*  AST 21  ALT 14*  ALKPHOS 42  BILITOT 1.3*    Assessment / Plan:    Assessment: 1. FOBT positive stool in setting of both Plavix an Eliquis: EGD 07/19/16 negative for etiology 2. Normocytic anemia: pt transfused 2 u prbcs 07/19/16 3. Thrombocytopenia 4. History of hep C: Treated successfully in late 2015; u/s 07/18/16-suggestive of  cirhosis  Plan: 1. Scheduling colonoscopy for tomorrow with Dr. Silverio Decamp. Discussed risks, benefits, limitations and alternatives the patient agrees to proceed. 2. Patient will remain on clear liquids for the rest of today and nothing by mouth after midnight, prep has been ordered. 3. Continue supportive measures  Thank you for your kind consultation, we will continue to follow    LOS: 3 days   Levin Erp  07/21/2016, 9:05 AM  Pager # [phone]   ________________________________________________________________________  Velora Heckler GI MD note:  I personally examined the patient, reviewed the data and agree with the assessment and plan described above.  He's feeling well and understands the plan for colonoscopy tomorrow for GI bleeding (dark and red mixed blood) in setting of eliquis and (recently added) plavix.     Owens Loffler, MD [facility] Pager [phone]

## 2016-07-22 NOTE — Interval H&P Note (Signed)
History and Physical Interval Note:  07/22/2016 11:49 AM  Anthony Mcfarland  has presented today for surgery, with the diagnosis of Anemia, GI Bleed  The various methods of treatment have been discussed with the patient and family. After consideration of risks, benefits and other options for treatment, the patient has consented to  Procedure(s): COLONOSCOPY (N/A) as a surgical intervention .  The patient's history has been reviewed, patient examined, no change in status, stable for surgery.  I have reviewed the patient's chart and labs.  Questions were answered to the patient's satisfaction.     [name]

## 2016-07-22 NOTE — Evaluation (Signed)
Physical Therapy Evaluation Patient Details Name: Anthony Mcfarland MRN: [id number] DOB: 09/09/1950 Today's Date: 07/22/2016   History of Present Illness  66 y.o.male. PMH HOCM. Non-obstructive CAD. NSVT. Afib, s/p ablation 03/2016. S/p ICD 2015. type 2 DM. Hypothyroidism. Htn. CKD stage 3. Lap chole 2008.  Anemia. Hepatitis C and completed antiretroviral therapyin 2015. Recent abdominal ultrasound showed  cirrhosis. Presented to [facility] ED on 2/8 with acute GI bleed as determined by EGD.  Clinical Impression  PTA pt was mod-I with use of cane for mobility, however, pt now requires minA for all mobility due to decreased balance and strength. Pt limited by cognitive deficits and perseverating. Pt would benefit from d/c to SNF to improve strength and balance for improved independence and safety when medically ready. Will follow acutely.     Follow Up Recommendations SNF;Supervision for mobility/OOB    Equipment Recommendations  Rolling walker with 5" wheels    Recommendations for Other Services       Precautions / Restrictions Precautions Precautions: Fall Restrictions Weight Bearing Restrictions: No      Mobility  Bed Mobility Overal bed mobility: Needs Assistance Bed Mobility: Supine to Sit     Supine to sit: Min assist     General bed mobility comments: increased time, minA x 1 for trunk upright, v/c for technique and to square hips  Transfers Overall transfer level: Needs assistance Equipment used: Rolling walker (2 wheeled) Transfers: Sit to/from Stand Sit to Stand: Min assist         General transfer comment: rocking and minA x 1 for power up; v/c for push up from bed and transfer to RW and upright posture  Ambulation/Gait Ambulation/Gait assistance: Min assist Ambulation Distance (Feet): 60 Feet Assistive device: Rolling walker (2 wheeled) Gait Pattern/deviations: Step-to pattern;Decreased step length - right;Decreased step length - left;Decreased stride  length;Trunk flexed;Wide base of support Gait velocity: decreased Gait velocity interpretation: Below normal speed for age/gender General Gait Details: slow speed, difficulty with continuous ambulation and RW movement; v/c for step through pattern with continuous movement of RW and proper use of RW  Stairs            Wheelchair Mobility    Modified Rankin (Stroke Patients Only)       Balance Overall balance assessment: Needs assistance Sitting-balance support: Feet supported;No upper extremity supported Sitting balance-Leahy Scale: Good Sitting balance - Comments: sat EOB x 2 min, v/c for technique to move hips toward EOB   Standing balance support: Bilateral upper extremity supported Standing balance-Leahy Scale: Poor Standing balance comment: attempted to use cane, but pt unsteady and requires B UE support to remain upright                             Pertinent Vitals/Pain Pain Assessment: No/denies pain    Home Living Family/patient expects to be discharged to:: Unsure                 Additional Comments: PTA pt lived with sister and daughter in one story home with 7 steps to enter and B handrails, toilet riser and shower seat with grab bars in shower; daughter works so pt would be under care of sister during the day and pt and daughter would like to further discuss if sister would be able to provide necessary physical assist     Prior Function Level of Independence: Independent with assistive device(s)         Comments: mod-I  with use of cane, per daughter-pt did not use cane with household amb     Hand Dominance   Dominant Hand: Right    Extremity/Trunk Assessment   Upper Extremity Assessment Upper Extremity Assessment: Generalized weakness    Lower Extremity Assessment Lower Extremity Assessment: Generalized weakness    Cervical / Trunk Assessment Cervical / Trunk Assessment: Normal  Communication   Communication: No  difficulties  Cognition Arousal/Alertness: Awake/alert Behavior During Therapy: Impulsive Overall Cognitive Status: Impaired/Different from baseline Area of Impairment: Attention;Memory;Following commands;Safety/judgement;Awareness;Problem solving   Current Attention Level: Focused Memory: Decreased short-term memory Following Commands: Follows one step commands with increased time;Follows multi-step commands inconsistently Safety/Judgement: Decreased awareness of safety;Decreased awareness of deficits Awareness: Intellectual Problem Solving: Slow processing;Decreased initiation;Difficulty sequencing;Requires verbal cues;Requires tactile cues General Comments: pt perseverating on coffee and upon being asked what he would preferred to be called responded "sir or coffee," requires max v/c for proper RW and tries to abandon RW prematurely when returning to recliner    General Comments      Exercises     Assessment/Plan    PT Assessment Patient needs continued PT services  PT Problem List Decreased strength;Decreased activity tolerance;Decreased balance;Decreased mobility;Decreased cognition;Decreased knowledge of use of DME;Decreased safety awareness          PT Treatment Interventions DME instruction;Gait training;Stair training;Therapeutic exercise;Balance training;Patient/family education    PT Goals (Current goals can be found in the Care Plan section)  Acute Rehab PT Goals Patient Stated Goal: I want to return home to dog PT Goal Formulation: With patient Time For Goal Achievement: 08/05/16 Potential to Achieve Goals: Good    Frequency Min 3X/week   Barriers to discharge Decreased caregiver support daughter works during day and unable to provide supervision/assist during day; unsure if pt's sister can provide physical assist    Co-evaluation               End of Session Equipment Utilized During Treatment: Gait belt Activity Tolerance: Patient tolerated  treatment well Patient left: in chair;with chair alarm set;with call bell/phone within reach;with family/visitor present Nurse Communication: Mobility status         Time: 1350-1415 PT Time Calculation (min) (ACUTE ONLY): 25 min   Charges:   PT Evaluation $PT Eval Moderate Complexity: 1 Procedure PT Treatments $Gait Training: 8-22 mins   PT G CodesTracie Harrier 30-Jul-2016, 3:38 PM  Tracie Harrier, SPT Acute Rehab SPT [phone]

## 2016-07-22 NOTE — Progress Notes (Signed)
Clinical Social Worker met patient at bedside to offer support and discuss patients needs at bedside. Patient stated he lives at home with his sister and his sister helps patients with medication. Patient stated he has 24/hr care at home but would be okay to discharge to SNF as long as he stays their for a day or two. Patient stated daughter is at hospital and gave CSW permission to speak to daughter about SNF placement.  CSW spoke to daughter Geni Bers. Geni Bers stated she lives at home with patient and aunt and family would be able to provided 24/hr care for patient. Geni Bers [place]. Geni Bers stated she wanted to talk to patient about placement to make sure they are on the same page. Per nurse statement family has decided to discharge home. CSW is signing off  Rhea Pink, MSW,  [place] [phone]

## 2016-07-22 NOTE — Op Note (Signed)
[facility] Patient Name: Anthony Mcfarland Procedure Date : 07/22/2016 MRN: [id number] Attending MD: Mauri Pole , MD Date of Birth: 1951-05-30 CSN: [id number] Age: 66 Admit Type: Inpatient Procedure:                Colonoscopy Indications:              Evaluation of unexplained GI bleeding, This is the                            patient's first colonoscopy Providers:                Mauri Pole, MD, Kingsley Plan, RN, Cletis Athens, Technician Referring MD:              Medicines:                Monitored Anesthesia Care Complications:            No immediate complications. Estimated Blood Loss:     Estimated blood loss was minimal. Procedure:                Pre-Anesthesia Assessment:                           - Prior to the procedure, a History and Physical                            was performed, and patient medications and                            allergies were reviewed. The patient's tolerance of                            previous anesthesia was also reviewed. The risks                            and benefits of the procedure and the sedation                            options and risks were discussed with the patient.                            All questions were answered, and informed consent                            was obtained. Prior Anticoagulants: The patient                            last took Eliquis (apixaban) 5 days and Plavix                            (clopidogrel) 5 days prior to the procedure. ASA  Grade Assessment: III - A patient with severe                            systemic disease. After reviewing the risks and                            benefits, the patient was deemed in satisfactory                            condition to undergo the procedure.                           After obtaining informed consent, the colonoscope                            was passed under direct  vision. Throughout the                            procedure, the patient's blood pressure, pulse, and                            oxygen saturations were monitored continuously. The                            EC-[id number] scope was introduced through                            the anus and advanced to the the cecum, identified                            by appendiceal orifice and ileocecal valve. The                            colonoscopy was performed without difficulty. The                            patient tolerated the procedure well. The quality                            of the bowel preparation was adequate to identify                            polyps 6 mm and larger in size. The ileocecal                            valve, appendiceal orifice, and rectum were                            photographed. Scope In: [id number] PM Scope Out: 12:39:53 PM Scope Withdrawal Time: 0 hours 22 minutes 14 seconds  Total Procedure Duration: 0 hours 33 minutes 34 seconds  Findings:      The perianal and digital rectal examinations were normal.      A 7 mm polyp was found in the transverse  colon. The polyp was sessile.       The polyp was removed with a cold snare. Resection and retrieval were       complete.      A scattered area of moderately erythematous congested mucosa was found       in the ascending colon and in the cecum with active oozing of blood with       minimal touch of scope. Biopsies were taken with a cold forceps for       histology from cecum, ascending, trasnverse , descending, sigmoid colon       and rectum.      The exam was otherwise without abnormality with normal appearing mucosa       and minimal congestion in rest of the colon. Impression:               - One 7 mm polyp in the transverse colon, removed                            with a cold snare. Resected and retrieved.                           - Erythematous mucosa in the ascending colon and in                             the cecum. Biopsied. Differential includes ischemic                            colitis vs ulcerative colitis                           - The examination was otherwise normal. Moderate Sedation:      N/A Recommendation:           - Patient has a contact number available for                            emergencies. The signs and symptoms of potential                            delayed complications were discussed with the                            patient. Return to normal activities tomorrow.                            Written discharge instructions were provided to the                            patient.                           - Resume previous diet.                           - Continue present medications.                           -  Resume Eliquis (apixaban) and Plavix                            (clopidogrel) in 5 days at prior doses. Refer to                            managing physician for further adjustment of                            therapy.                           - Await pathology results.                           - Repeat colonoscopy is recommended for                            surveillance. The colonoscopy date will be                            determined after pathology results from today's                            exam become available for review.                           - Return to GI clinic at [facility] appointment to                            be scheduled. Procedure Code(s):        --- Professional ---                           [phone], Colonoscopy, flexible; with removal of                            tumor(s), polyp(s), or other lesion(s) by snare                            technique                           [id number], 51, Colonoscopy, flexible; with biopsy,                            single or multiple Diagnosis Code(s):        --- Professional ---                           D12.3, Benign neoplasm of transverse colon (hepatic                             flexure or splenic flexure)                           K63.89, Other  specified diseases of intestine                           K92.2, Gastrointestinal hemorrhage, unspecified CPT copyright 2016 [employer]. All rights reserved. The codes documented in this report are preliminary and upon coder review may  be revised to meet current compliance requirements. Mauri Pole, MD 07/22/2016 12:59:22 PM This report has been signed electronically. Number of Addenda: 0

## 2016-07-22 NOTE — Care Management Important Message (Signed)
Important Message  Patient Details  Name: Anthony Mcfarland MRN: [id number] Date of Birth: April 26, 1951   Medicare Important Message Given:  Yes    Orbie Pyo 07/22/2016, 4:11 PM

## 2016-07-22 NOTE — Anesthesia Preprocedure Evaluation (Signed)
Anesthesia Evaluation  Patient identified by MRN, date of birth, ID band Patient awake    Reviewed: Allergy & Precautions, NPO status , Patient's Chart, lab work & pertinent test results  Airway Mallampati: II  TM Distance: >3 FB Neck ROM: Full    Dental  (+) Teeth Intact   Pulmonary     + decreased breath sounds      Cardiovascular + CAD   Rhythm:Regular Rate:Normal     Neuro/Psych    GI/Hepatic (+) Hepatitis -GI bleed   Endo/Other  diabetes  Renal/GU      Musculoskeletal   Abdominal   Peds  Hematology   Anesthesia Other Findings   Reproductive/Obstetrics                             Anesthesia Physical  Anesthesia Plan  ASA: III  Anesthesia Plan: MAC   Post-op Pain Management:    Induction: Intravenous  Airway Management Planned: Natural Airway and Nasal Cannula  Additional Equipment:   Intra-op Plan:   Post-operative Plan:   Informed Consent: I have reviewed the patients History and Physical, chart, labs and discussed the procedure including the risks, benefits and alternatives for the proposed anesthesia with the patient or authorized representative who has indicated his/her understanding and acceptance.     Plan Discussed with: CRNA and Anesthesiologist  Anesthesia Plan Comments:         Anesthesia Quick Evaluation

## 2016-07-22 NOTE — Anesthesia Postprocedure Evaluation (Addendum)
Anesthesia Post Note  Patient: Anthony Mcfarland  Procedure(s) Performed: Procedure(s) (LRB): COLONOSCOPY (N/A)  Patient location during evaluation: Endoscopy Anesthesia Type: MAC Level of consciousness: awake and alert Pain management: pain level controlled Vital Signs Assessment: post-procedure vital signs reviewed and stable Respiratory status: spontaneous breathing, nonlabored ventilation, respiratory function stable and patient connected to nasal cannula oxygen Cardiovascular status: stable and blood pressure returned to baseline Anesthetic complications: no       Last Vitals:  Vitals:   07/22/16 1315 07/22/16 1325  BP: (!) 154/81 (!) 158/78  Pulse: 67 70  Resp: (!) 28 15  Temp:      Last Pain:  Vitals:   07/22/16 1247  TempSrc: Oral  PainSc:                  Ha Placeres,Edgel TERRILL

## 2016-07-22 NOTE — Transfer of Care (Signed)
Immediate Anesthesia Transfer of Care Note  Patient: [name]  Procedure(s) Performed: Procedure(s): COLONOSCOPY (N/A)  Patient Location: Endoscopy Unit  Anesthesia Type:MAC  Level of Consciousness: awake, alert  and patient cooperative  Airway & Oxygen Therapy: Patient Spontanous Breathing  Post-op Assessment: Report given to RN and Post -op Vital signs reviewed and stable  Post vital signs: Reviewed and stable  Last Vitals:  Vitals:   07/22/16 0403 07/22/16 1132  BP: (!) 143/71 (!) 154/74  Pulse: 72 72  Resp: 18 19  Temp: 37 C 36.7 C    Last Pain:  Vitals:   07/22/16 1132  TempSrc: Oral  PainSc:          Complications: No apparent anesthesia complications

## 2016-07-22 NOTE — Progress Notes (Signed)
Patient discharged to home with belongings, IVs and tele removed, AVS given, prescriptions given. Teach back performed, all questions answered, patient stable at time of discharge.

## 2016-07-22 NOTE — Anesthesia Procedure Notes (Signed)
Procedure Name: MAC Date/Time: 07/22/2016 11:54 AM Performed by: Salli Quarry Keino Placencia Pre-anesthesia Checklist: Patient identified, Emergency Drugs available, Suction available and Patient being monitored Oxygen Delivery Method: Nasal cannula

## 2016-07-22 NOTE — Discharge Summary (Signed)
Physician Discharge Summary  Anthony Mcfarland MRN: [id number] DOB/AGE: 1950/08/13 66 y.o.  PCP: Pcp Not In System   Admit date: 07/17/2016 Discharge date: 07/22/2016  Discharge Diagnoses:    Active Problems:   Gastrointestinal hemorrhage   Ascites   Cirrhosis (HCC)   Gastritis and gastroduodenitis   Polyp of transverse colon   Ulcerative pancolitis with rectal bleeding ([place])    Follow-up recommendations Follow-up with PCP in 3-5 days , including all  additional recommended appointments as below Follow-up CBC, CMP in 3-5 days Patient would need repeat surveillance colonoscopy Patient would need to follow-up with GI for GI bleeding and cirrhosis Patient would  resume eliquis and Plavix in 5 days, if hemoglobin remains stable Patient has been advised to keep himself hydrated at all times      Current Discharge Medication List    START taking these medications   Details  pantoprazole (PROTONIX) 40 MG tablet Take 1 tablet (40 mg total) by mouth daily at 6 (six) AM. Qty: 30 tablet, Refills: 1      CONTINUE these medications which have CHANGED   Details  apixaban (ELIQUIS) 5 MG TABS tablet Take 1 tablet (5 mg total) by mouth 2 (two) times daily. Qty: 60 tablet, Refills: 0    clopidogrel (PLAVIX) 75 MG tablet Take 1 tablet (75 mg total) by mouth daily. Qty: 30 tablet, Refills: 0      CONTINUE these medications which have NOT CHANGED   Details  albuterol (PROVENTIL HFA;VENTOLIN HFA) 108 (90 Base) MCG/ACT inhaler Inhale 2 puffs into the lungs every 6 (six) hours as needed for wheezing or shortness of breath.    carvedilol (COREG) 25 MG tablet Take 25 mg by mouth 2 (two) times daily with a meal.    cholecalciferol (VITAMIN D) 1000 units tablet Take 2,000 Units by mouth daily.    furosemide (LASIX) 20 MG tablet Take 20 mg by mouth daily.    gabapentin (NEURONTIN) 300 MG capsule Take 1 capsule (300 mg total) by mouth at bedtime. Qty: 30 capsule, Refills: 4    insulin  glargine (LANTUS) 100 UNIT/ML injection Inject 0.3 mLs (30 Units total) into the skin at bedtime. Qty: 10 mL, Refills: 11    levothyroxine (SYNTHROID, LEVOTHROID) 88 MCG tablet Take 88 mcg by mouth daily before breakfast.    terazosin (HYTRIN) 2 MG capsule Take 1 mg by mouth at bedtime.       STOP taking these medications     amLODipine (NORVASC) 10 MG tablet      lisinopril (PRINIVIL,ZESTRIL) 40 MG tablet      sildenafil (VIAGRA) 50 MG tablet          Discharge Condition: Stable Discharge Instructions Get Medicines reviewed and adjusted: Please take all your medications with you for your next visit with your Primary MD  Please request your Primary MD to go over all hospital tests and procedure/radiological results at the follow up, please ask your Primary MD to get all Hospital records sent to his/her office.  If you experience worsening of your admission symptoms, develop shortness of breath, life threatening emergency, suicidal or homicidal thoughts you must seek medical attention immediately by calling 911 or calling your MD immediately if symptoms less severe.  You must read complete instructions/literature along with all the possible adverse reactions/side effects for all the Medicines you take and that have been prescribed to you. Take any new Medicines after you have completely understood and accpet all the possible adverse reactions/side effects.   Do  not drive when taking Pain medications.   Do not take more than prescribed Pain, Sleep and Anxiety Medications  Special Instructions: If you have smoked or chewed Tobacco in the last 2 yrs please stop smoking, stop any regular Alcohol and or any Recreational drug use.  Wear Seat belts while driving.  Please note  You were cared for by a hospitalist during your hospital stay. Once you are discharged, your primary care physician will handle any further medical issues. Please note that NO REFILLS for any discharge  medications will be authorized once you are discharged, as it is imperative that you return to your primary care physician (or establish a relationship with a primary care physician if you do not have one) for your aftercare needs so that they can reassess your need for medications and monitor your lab values.     Allergies  Allergen Reactions  . Grapefruit Extract Other (See Comments)    [facility] told [facility] not to eat  . Amoxicillin Other (See Comments)    unknown      Disposition: 01-Home or Self Care   Consults: GI     Significant Diagnostic Studies:  Ct Head Wo Contrast  Result Date: 07/17/2016 CLINICAL DATA:  Altered mental status, lethargy. History of diabetes. EXAM: CT HEAD WITHOUT CONTRAST TECHNIQUE: Contiguous axial images were obtained from the base of the skull through the vertex without intravenous contrast. COMPARISON:  None available for comparison at time of study interpretation. FINDINGS: BRAIN: The ventricles and sulci are normal for age. No intraparenchymal hemorrhage, mass effect nor midline shift. Patchy to confluent supratentorial white matter hypodensities. Old small LEFT cerebellar infarct. Old small LEFT frontal infarct. No acute large vascular territory infarcts. No abnormal extra-axial fluid collections. Basal cisterns are patent. VASCULAR: Moderate calcific atherosclerosis of the carotid siphons. SKULL: No skull fracture. No significant scalp soft tissue swelling. SINUSES/ORBITS: The mastoid air-cells and included paranasal sinuses are well-aerated.The included ocular globes and orbital contents are non-suspicious. OTHER: Poor dentition. IMPRESSION: No acute intracranial process. 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 Moderate to severe chronic small vessel ischemic disease. Electronically Signed   By: Elon Alas M.D.   On: 07/17/2016 22:54   US Abdomen Limited  Result Date: 07/18/2016 CLINICAL DATA:  Patient with weakness.   GI bleed. EXAM: US ABDOMEN LIMITED - RIGHT UPPER QUADRANT COMPARISON:  Ultrasound 04/12/2014. FINDINGS: Gallbladder: Surgically absent Common bile duct: Diameter: 5 mm Liver: Mixed echogenicity and nodular in contour. IMPRESSION: Gallbladder surgically absent. Mildly nodular mixed echogenicity liver raising the possibility of cirrhosis. Electronically Signed   By: Lovey Newcomer M.D.   On: 07/18/2016 19:52   Dg Chest Portable 1 View  Result Date: 07/17/2016 CLINICAL DATA:  Syncope EXAM: PORTABLE CHEST 1 VIEW COMPARISON:  10/15/2010 FINDINGS: Heart is enlarged but stable in appearance. Left-sided AICD device with right ventricular defibrillator lead and right atrial pacing wire noted. Focal bend in the proximal right atrial pacing wire noted. Correlate clinically to ensure appropriate function. Aortic atherosclerosis without aneurysm is identified. Left costophrenic angle is excluded. The lungs however are clear. No pneumothorax or significant pleural effusion. No acute osseous abnormality. IMPRESSION: Stable cardiomegaly with aortic atherosclerosis. No acute pulmonary disease. Focal bend in the proximal right atrial pacing wire is suggested. Correlate to assure appropriate functioning of the apparatus. Electronically Signed   By: Ashley Royalty M.D.   On: 07/17/2016 22:41        Filed Weights   07/20/16 0410 07/21/16  2505 07/22/16 0403  Weight: 77.1 kg (170 lb) 73.9 kg (163 lb) 72.6 kg (160 lb)     Microbiology: Recent Results (from the past 240 hour(s))  MRSA PCR Screening     Status: None   Collection Time: 07/18/16  4:49 AM  Result Value Ref Range Status   MRSA by PCR NEGATIVE NEGATIVE Final    Comment:        The GeneXpert MRSA Assay (FDA approved for NASAL specimens only), is one component of a comprehensive MRSA colonization surveillance program. It is not intended to diagnose MRSA infection nor to guide or monitor treatment for MRSA infections.        Blood Culture No results  found for: SDES, [place], CULT, REPTSTATUS    Labs: Results for orders placed or performed during the hospital encounter of 07/17/16 (from the past 48 hour(s))  Glucose, capillary     Status: Abnormal   Collection Time: 07/20/16  4:32 PM  Result Value Ref Range   Glucose-Capillary 184 (H) 65 - 99 mg/dL   Comment 1 Notify RN    Comment 2 Document in Chart   Glucose, capillary     Status: Abnormal   Collection Time: 07/20/16  8:25 PM  Result Value Ref Range   Glucose-Capillary 142 (H) 65 - 99 mg/dL  Glucose, capillary     Status: Abnormal   Collection Time: 07/21/16 12:26 AM  Result Value Ref Range   Glucose-Capillary 132 (H) 65 - 99 mg/dL  CBC     Status: Abnormal   Collection Time: 07/21/16  2:05 AM  Result Value Ref Range   WBC 8.9 4.0 - 10.5 K/uL   RBC 3.24 (L) 4.22 - 5.81 MIL/uL   Hemoglobin 9.0 (L) 13.0 - 17.0 g/dL   HCT 26.8 (L) 39.0 - 52.0 %   MCV 82.7 78.0 - 100.0 fL   MCH 27.8 26.0 - 34.0 pg   MCHC 33.6 30.0 - 36.0 g/dL   RDW 15.5 11.5 - 15.5 %   Platelets 114 (L) 150 - 400 K/uL    Comment: CONSISTENT WITH PREVIOUS RESULT REPEATED TO VERIFY   Comprehensive metabolic panel     Status: Abnormal   Collection Time: 07/21/16  2:05 AM  Result Value Ref Range   Sodium 137 135 - 145 mmol/L   Potassium 3.7 3.5 - 5.1 mmol/L   Chloride 108 101 - 111 mmol/L   CO2 19 (L) 22 - 32 mmol/L   Glucose, Bld 98 65 - 99 mg/dL   BUN 7 6 - 20 mg/dL   Creatinine, Ser 1.45 (H) 0.61 - 1.24 mg/dL   Calcium 8.6 (L) 8.9 - 10.3 mg/dL   Total Protein 6.0 (L) 6.5 - 8.1 g/dL   Albumin 3.1 (L) 3.5 - 5.0 g/dL   AST 21 15 - 41 U/L   ALT 14 (L) 17 - 63 U/L   Alkaline Phosphatase 42 38 - 126 U/L   Total Bilirubin 1.3 (H) 0.3 - 1.2 mg/dL   GFR calc non Af Amer 49 (L) >60 mL/min   GFR calc Af Amer 56 (L) >60 mL/min    Comment: (NOTE) The eGFR has been calculated using the CKD EPI equation. This calculation has not been validated in all clinical situations. eGFR's persistently <60 mL/min  signify possible Chronic Kidney Disease.    Anion gap 10 5 - 15  Glucose, capillary     Status: Abnormal   Collection Time: 07/21/16  4:10 AM  Result Value Ref Range  Glucose-Capillary 105 (H) 65 - 99 mg/dL  Glucose, capillary     Status: Abnormal   Collection Time: 07/21/16 11:45 AM  Result Value Ref Range   Glucose-Capillary 158 (H) 65 - 99 mg/dL   Comment 1 Notify RN    Comment 2 Document in Chart   Glucose, capillary     Status: Abnormal   Collection Time: 07/21/16  5:44 PM  Result Value Ref Range   Glucose-Capillary 224 (H) 65 - 99 mg/dL  Glucose, capillary     Status: Abnormal   Collection Time: 07/21/16  8:27 PM  Result Value Ref Range   Glucose-Capillary 185 (H) 65 - 99 mg/dL  Glucose, capillary     Status: Abnormal   Collection Time: 07/21/16 11:56 PM  Result Value Ref Range   Glucose-Capillary 155 (H) 65 - 99 mg/dL  CBC     Status: Abnormal   Collection Time: 07/22/16  2:33 AM  Result Value Ref Range   WBC 9.2 4.0 - 10.5 K/uL   RBC 3.27 (L) 4.22 - 5.81 MIL/uL   Hemoglobin 9.1 (L) 13.0 - 17.0 g/dL   HCT 27.3 (L) 39.0 - 52.0 %   MCV 83.5 78.0 - 100.0 fL   MCH 27.8 26.0 - 34.0 pg   MCHC 33.3 30.0 - 36.0 g/dL   RDW 15.5 11.5 - 15.5 %   Platelets 137 (L) 150 - 400 K/uL  Comprehensive metabolic panel     Status: Abnormal   Collection Time: 07/22/16  2:33 AM  Result Value Ref Range   Sodium 141 135 - 145 mmol/L   Potassium 3.6 3.5 - 5.1 mmol/L   Chloride 113 (H) 101 - 111 mmol/L   CO2 21 (L) 22 - 32 mmol/L   Glucose, Bld 45 (L) 65 - 99 mg/dL   BUN 5 (L) 6 - 20 mg/dL   Creatinine, Ser 1.48 (H) 0.61 - 1.24 mg/dL   Calcium 8.8 (L) 8.9 - 10.3 mg/dL   Total Protein 6.2 (L) 6.5 - 8.1 g/dL   Albumin 3.2 (L) 3.5 - 5.0 g/dL   AST 21 15 - 41 U/L   ALT 12 (L) 17 - 63 U/L   Alkaline Phosphatase 43 38 - 126 U/L   Total Bilirubin 1.2 0.3 - 1.2 mg/dL   GFR calc non Af Amer 48 (L) >60 mL/min   GFR calc Af Amer 55 (L) >60 mL/min    Comment: (NOTE) The eGFR has been  calculated using the CKD EPI equation. This calculation has not been validated in all clinical situations. eGFR's persistently <60 mL/min signify possible Chronic Kidney Disease.    Anion gap 7 5 - 15  Glucose, capillary     Status: Abnormal   Collection Time: 07/22/16  3:57 AM  Result Value Ref Range   Glucose-Capillary 62 (L) 65 - 99 mg/dL  Glucose, capillary     Status: Abnormal   Collection Time: 07/22/16  4:29 AM  Result Value Ref Range   Glucose-Capillary 140 (H) 65 - 99 mg/dL  Glucose, capillary     Status: Abnormal   Collection Time: 07/22/16  8:31 AM  Result Value Ref Range   Glucose-Capillary 132 (H) 65 - 99 mg/dL   Comment 1 Notify RN      Lipid Panel  No results found for: CHOL, TRIG, HDL, CHOLHDL, VLDL, LDLCALC, LDLDIRECT   Lab Results  Component Value Date   HGBA1C 15.7 (H) 04/12/2014     Lab Results  Component Value Date   CREATININE  1.48 (H) 07/22/2016     HPI :   66 y.o.malewith PMH as outlined below. He presented to [facility] ED 02/08 with generalized weakness and near syncopal event while in bathroom (but never had full syncope).  patient admitted for hematochezia. She was unable to tell if this was bright red or not as "it was mixed with liquid". He is on Eliquis on A.fib and Plavix for CAD. Denies any ASA or NSAID use. Has never had GI bleed in the past. Had 1 episode of vomiting earlier in the day but otherwise no N/V/D, abd pain.  In ED, Hgb noted to be 6.2. He was given Eppie Gibson and GI was consulted and they plan to see him tonight. Initially he had borderline BP which has since resolved.  PCCM was asked to admit.  HOSPITAL COURSE:    Hematochezia   in the setting of  Eliquis ,S/p K centra.  Patient underwent EGD to R/o portal gastropathy, varices. R/o PUD. R/o telangectasia. R/o lower GI bleed: ischemic colitis in setting of recent rapid a fib/flutter, r/o neoplasia (no previous colonoscopy).  GI performed EGD 2/9 , EGD showed normal  esophagus, normal stomach, no signs of portal gastropathy, patient would be scheduled for colonoscopy on Monday 2/12  didn't receive octreotide   given his previous Etoh abuse, Hep C . Right upper quadrant Ultrasound  Was done suggestive of cirrhosis needs colonoscopy after  complete plavix washout , scheduled for Monday Hematochezia seems to have resolved  Colonoscopy showed a 7 mm polyp in the transverse colon which was removed, biopsies are pending. Differential includes ischemic versus ulcerative colitis.  Patient would need to follow-up with GI for GI bleeding and cirrhosis Patient would  resume eliquis and Plavix in 5 days, if hemoglobin remains stable   normocytic anemia. Improving. Initially hemoglobin was 6.2, status post transfusion of a total of 4 units of packed red blood cells, on 2/7 and 2/9 Hemoglobin trended and remained stable. 9.0 and 9.1 Okay to discharge home as per,Kavitha Bary Richard, MD Deemed low risk for recurrent bleeding  * Hx Hep C, treated successfully in late 2015. ? Cirrhosis.  abdominal ultrasound to assess for ascites, showed minimal ascites  * Thrombocytopenia. continues to be low but improving, may need platelet transfusion in the setting of dysfunctional platelets . Monitor platelets closely in the outpatient setting    * Elevated Troponins, ? Demand ischemia. Held preadmission eliquis, plavix, amlodipine, carvedilol, furosemide, lisinopril, terazosin 2-D echo shows EF of 65-70% with severe LVH. 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   * S/p sleep study 2/4, Tech program at test felt that this was positive for OSA but the patient and his family have not yet heard from the doctor with the official  report  Hypomagnesemia-repleted, now 2.4  Hypertension-patient on multiple antihypertensive medications, discontinued ACE inhibitor and Norvasc given borderline blood pressure in the setting of ischemic colitis   Discharge Exam: *  Blood pressure (!) 156/83, pulse 68, temperature 98.1 F (36.7 C), temperature source Oral, resp. rate (!) 30, height _0  (1.854 m), weight 72.6 kg (160 lb), SpO2 100 %.   General exam: Appears calm and comfortable  Respiratory system: Clear to auscultation. Respiratory effort normal. Cardiovascular system: S1 & S2 heard, RRR. No JVD, murmurs, rubs, gallops  or clicks. No pedal edema. Gastrointestinal system: Abdomen is nondistended, soft and nontender. No organomegaly or masses felt. Normal bowel sounds heard. Central nervous system: Alert and oriented. No focal neurological deficits. Extremities: Symmetric 5 x 5 power. Skin: No rashes, lesions or ulcers Psychiatry: Judgement and insight appear normal. Mood & affect appropriate.   Follow-up Information    Primary care provider. Schedule an appointment as soon as possible for a visit.   Why:  3-5 days, hospital follow-up, need repeat CBC, CMP, magnesium       Milus Banister, MD. Schedule an appointment as soon as possible for a visit.   Specialty:  Gastroenterology Why:  Hospital follow-up, call to make appointment, needs to be seen for cirrhosis, GI bleeding Contact information: [address] [address] [phone]        Heather Joan Jacobs, NP .   Specialty:  Urology Contact information: [place] [address] [phone]           Signed: Reyne Dumas 07/22/2016, 1:08 PM        Time spent >45 mins

## 2016-07-22 NOTE — Progress Notes (Signed)
Hypoglycemic Event  CBG:62  Treatment:D50 32ml  Symptoms: none  Follow-up CBG: Time:0429 CBG Result:140  Possible Reasons for Event:NPO  Comments/MD notified:no    Netta Corrigan, Dorella Laster [employer]

## 2016-07-22 NOTE — Progress Notes (Signed)
Inpatient Diabetes Program Recommendations  AACE/ADA: New Consensus Statement on Inpatient Glycemic Control (2015)  Target Ranges:  Prepandial:   less than 140 mg/dL      Peak postprandial:   less than 180 mg/dL (1-2 hours)      Critically ill patients:  140 - 180 mg/dL   Lab Results  Component Value Date   GLUCAP 132 (H) 07/22/2016   HGBA1C 15.7 (H) 04/12/2014    Review of Glycemic Control Results for Anthony Mcfarland, Anthony Mcfarland (MRN [id number]) as of 07/22/2016 10:58  Ref. Range 07/21/2016 20:27 07/21/2016 23:56 07/22/2016 03:57 07/22/2016 04:29 07/22/2016 08:31  Glucose-Capillary Latest Ref Range: 65 - 99 mg/dL 185 (H) 155 (H) 62 (L) 140 (H) 132 (H)   Inpatient Diabetes Program Recommendations:  Noted NPO today for scheduled procedure and hypoglycemia post novolog correction.  Please consider decrease in Novolog correction to sensitive 0-9 units q 4 hrs while NPO.Will follow.  Thank you, Nani Gasser. Taniaya Rudder, RN, MSN, CDE Inpatient Glycemic Control Team Team Pager [phone] (8am-5pm) 07/22/2016 10:59 AM

## 2016-07-24 ENCOUNTER — Encounter (HOSPITAL_COMMUNITY): Payer: Self-pay | Admitting: Gastroenterology

## 2016-07-29 ENCOUNTER — Encounter: Payer: Self-pay | Admitting: Physician Assistant

## 2016-07-30 ENCOUNTER — Ambulatory Visit: Payer: Medicare Other | Admitting: Physician Assistant

## 2016-08-05 ENCOUNTER — Encounter: Payer: Self-pay | Admitting: Gastroenterology

## 2016-09-04 ENCOUNTER — Emergency Department (HOSPITAL_COMMUNITY): Payer: Medicare Other

## 2016-09-04 ENCOUNTER — Inpatient Hospital Stay (HOSPITAL_COMMUNITY)
Admission: EM | Admit: 2016-09-04 | Discharge: 2016-09-08 | DRG: 872 | Disposition: A | Discharge status: Home Health | Payer: Medicare Other | Attending: Internal Medicine | Admitting: Internal Medicine

## 2016-09-04 ENCOUNTER — Encounter (HOSPITAL_COMMUNITY): Payer: Self-pay | Admitting: *Deleted

## 2016-09-04 DIAGNOSIS — N200 Calculus of kidney: Secondary | ICD-10-CM | POA: Diagnosis not present

## 2016-09-04 DIAGNOSIS — R935 Abnormal findings on diagnostic imaging of other abdominal regions, including retroperitoneum: Secondary | ICD-10-CM

## 2016-09-04 DIAGNOSIS — N390 Urinary tract infection, site not specified: Secondary | ICD-10-CM | POA: Diagnosis not present

## 2016-09-04 DIAGNOSIS — Z800 Family history of malignant neoplasm of digestive organs: Secondary | ICD-10-CM | POA: Diagnosis not present

## 2016-09-04 DIAGNOSIS — N202 Calculus of kidney with calculus of ureter: Secondary | ICD-10-CM | POA: Diagnosis present

## 2016-09-04 DIAGNOSIS — Z881 Allergy status to other antibiotic agents status: Secondary | ICD-10-CM | POA: Diagnosis not present

## 2016-09-04 DIAGNOSIS — N179 Acute kidney failure, unspecified: Secondary | ICD-10-CM | POA: Diagnosis not present

## 2016-09-04 DIAGNOSIS — D696 Thrombocytopenia, unspecified: Secondary | ICD-10-CM | POA: Diagnosis present

## 2016-09-04 DIAGNOSIS — D638 Anemia in other chronic diseases classified elsewhere: Secondary | ICD-10-CM | POA: Diagnosis present

## 2016-09-04 DIAGNOSIS — K745 Biliary cirrhosis, unspecified: Secondary | ICD-10-CM

## 2016-09-04 DIAGNOSIS — E119 Type 2 diabetes mellitus without complications: Secondary | ICD-10-CM | POA: Diagnosis not present

## 2016-09-04 DIAGNOSIS — R7881 Bacteremia: Secondary | ICD-10-CM

## 2016-09-04 DIAGNOSIS — D649 Anemia, unspecified: Secondary | ICD-10-CM | POA: Diagnosis not present

## 2016-09-04 DIAGNOSIS — A0472 Enterocolitis due to Clostridium difficile, not specified as recurrent: Secondary | ICD-10-CM | POA: Diagnosis present

## 2016-09-04 DIAGNOSIS — A419 Sepsis, unspecified organism: Secondary | ICD-10-CM

## 2016-09-04 DIAGNOSIS — B964 Proteus (mirabilis) (morganii) as the cause of diseases classified elsewhere: Secondary | ICD-10-CM | POA: Diagnosis present

## 2016-09-04 DIAGNOSIS — R188 Other ascites: Secondary | ICD-10-CM | POA: Diagnosis not present

## 2016-09-04 DIAGNOSIS — Z9102 Food additives allergy status: Secondary | ICD-10-CM

## 2016-09-04 DIAGNOSIS — Z960 Presence of urogenital implants: Secondary | ICD-10-CM | POA: Diagnosis present

## 2016-09-04 DIAGNOSIS — R748 Abnormal levels of other serum enzymes: Secondary | ICD-10-CM | POA: Diagnosis present

## 2016-09-04 DIAGNOSIS — E039 Hypothyroidism, unspecified: Secondary | ICD-10-CM | POA: Diagnosis present

## 2016-09-04 DIAGNOSIS — I2510 Atherosclerotic heart disease of native coronary artery without angina pectoris: Secondary | ICD-10-CM | POA: Diagnosis present

## 2016-09-04 DIAGNOSIS — Z794 Long term (current) use of insulin: Secondary | ICD-10-CM

## 2016-09-04 DIAGNOSIS — N2889 Other specified disorders of kidney and ureter: Secondary | ICD-10-CM

## 2016-09-04 DIAGNOSIS — Z7902 Long term (current) use of antithrombotics/antiplatelets: Secondary | ICD-10-CM

## 2016-09-04 DIAGNOSIS — Z95810 Presence of automatic (implantable) cardiac defibrillator: Secondary | ICD-10-CM

## 2016-09-04 DIAGNOSIS — R310 Gross hematuria: Secondary | ICD-10-CM

## 2016-09-04 DIAGNOSIS — D123 Benign neoplasm of transverse colon: Secondary | ICD-10-CM | POA: Diagnosis present

## 2016-09-04 DIAGNOSIS — R109 Unspecified abdominal pain: Secondary | ICD-10-CM | POA: Diagnosis not present

## 2016-09-04 DIAGNOSIS — E1165 Type 2 diabetes mellitus with hyperglycemia: Secondary | ICD-10-CM | POA: Diagnosis present

## 2016-09-04 DIAGNOSIS — K7460 Unspecified cirrhosis of liver: Secondary | ICD-10-CM | POA: Diagnosis present

## 2016-09-04 DIAGNOSIS — R739 Hyperglycemia, unspecified: Secondary | ICD-10-CM | POA: Diagnosis present

## 2016-09-04 DIAGNOSIS — A4159 Other Gram-negative sepsis: Secondary | ICD-10-CM

## 2016-09-04 DIAGNOSIS — R6520 Severe sepsis without septic shock: Secondary | ICD-10-CM | POA: Diagnosis not present

## 2016-09-04 DIAGNOSIS — N12 Tubulo-interstitial nephritis, not specified as acute or chronic: Secondary | ICD-10-CM | POA: Diagnosis present

## 2016-09-04 DIAGNOSIS — Z79899 Other long term (current) drug therapy: Secondary | ICD-10-CM

## 2016-09-04 DIAGNOSIS — Z9049 Acquired absence of other specified parts of digestive tract: Secondary | ICD-10-CM | POA: Diagnosis not present

## 2016-09-04 DIAGNOSIS — L0291 Cutaneous abscess, unspecified: Secondary | ICD-10-CM

## 2016-09-04 HISTORY — DX: Adverse effect of unspecified anesthetic, initial encounter: T41.45XA

## 2016-09-04 HISTORY — DX: Other complications of anesthesia, initial encounter: T88.59XA

## 2016-09-04 HISTORY — DX: Gastrointestinal hemorrhage, unspecified: K92.2

## 2016-09-04 LAB — CBC WITH DIFFERENTIAL/PLATELET
Basophils Absolute: 0 10*3/uL (ref 0.0–0.1)
Basophils Relative: 0 %
EOS PCT: 0 %
Eosinophils Absolute: 0 10*3/uL (ref 0.0–0.7)
HCT: 32 % — ABNORMAL LOW (ref 39.0–52.0)
Hemoglobin: 10.8 g/dL — ABNORMAL LOW (ref 13.0–17.0)
LYMPHS ABS: 0.5 10*3/uL — AB (ref 0.7–4.0)
Lymphocytes Relative: 2 %
MCH: 25 pg — AB (ref 26.0–34.0)
MCHC: 33.8 g/dL (ref 30.0–36.0)
MCV: 74.1 fL — AB (ref 78.0–100.0)
MONO ABS: 1.6 10*3/uL — AB (ref 0.1–1.0)
MONOS PCT: 5 %
Neutro Abs: 26.6 10*3/uL — ABNORMAL HIGH (ref 1.7–7.7)
Neutrophils Relative %: 93 %
PLATELETS: 115 10*3/uL — AB (ref 150–400)
RBC: 4.32 MIL/uL (ref 4.22–5.81)
RDW: 16.9 % — AB (ref 11.5–15.5)
WBC: 28.8 10*3/uL — ABNORMAL HIGH (ref 4.0–10.5)

## 2016-09-04 LAB — URINALYSIS, ROUTINE W REFLEX MICROSCOPIC
Bilirubin Urine: NEGATIVE
Bilirubin Urine: NEGATIVE
GLUCOSE, UA: 150 mg/dL — AB
Glucose, UA: 150 mg/dL — AB
KETONES UR: NEGATIVE mg/dL
Ketones, ur: 5 mg/dL — AB
NITRITE: NEGATIVE
Nitrite: NEGATIVE
PH: 6 (ref 5.0–8.0)
PROTEIN: 100 mg/dL — AB
PROTEIN: 30 mg/dL — AB
SPECIFIC GRAVITY, URINE: 1.017 (ref 1.005–1.030)
Specific Gravity, Urine: 1.018 (ref 1.005–1.030)
pH: 5 (ref 5.0–8.0)

## 2016-09-04 LAB — MRSA PCR SCREENING: MRSA BY PCR: NEGATIVE

## 2016-09-04 LAB — RAPID URINE DRUG SCREEN, HOSP PERFORMED
AMPHETAMINES: NOT DETECTED
BARBITURATES: NOT DETECTED
BENZODIAZEPINES: NOT DETECTED
Cocaine: POSITIVE — AB
Opiates: POSITIVE — AB
TETRAHYDROCANNABINOL: NOT DETECTED

## 2016-09-04 LAB — COMPREHENSIVE METABOLIC PANEL
ALT: 19 U/L (ref 17–63)
AST: 29 U/L (ref 15–41)
Albumin: 3.5 g/dL (ref 3.5–5.0)
Alkaline Phosphatase: 64 U/L (ref 38–126)
Anion gap: 14 (ref 5–15)
BILIRUBIN TOTAL: 2.3 mg/dL — AB (ref 0.3–1.2)
BUN: 51 mg/dL — AB (ref 6–20)
CHLORIDE: 99 mmol/L — AB (ref 101–111)
CO2: 18 mmol/L — AB (ref 22–32)
CREATININE: 2.66 mg/dL — AB (ref 0.61–1.24)
Calcium: 9.2 mg/dL (ref 8.9–10.3)
GFR calc Af Amer: 27 mL/min — ABNORMAL LOW (ref >60)
GFR, EST NON AFRICAN AMERICAN: 23 mL/min — AB (ref >60)
Glucose, Bld: 278 mg/dL — ABNORMAL HIGH (ref 65–99)
Potassium: 4.2 mmol/L (ref 3.5–5.1)
Sodium: 131 mmol/L — ABNORMAL LOW (ref 135–145)
Total Protein: 9.2 g/dL — ABNORMAL HIGH (ref 6.5–8.1)

## 2016-09-04 LAB — LIPASE, BLOOD: Lipase: 24 U/L (ref 11–51)

## 2016-09-04 LAB — LACTIC ACID, PLASMA
LACTIC ACID, VENOUS: 2.4 mmol/L — AB (ref 0.5–1.9)
Lactic Acid, Venous: 2.8 mmol/L (ref 0.5–1.9)

## 2016-09-04 LAB — I-STAT CG4 LACTIC ACID, ED
LACTIC ACID, VENOUS: 2.89 mmol/L — AB (ref 0.5–1.9)
Lactic Acid, Venous: 3.73 mmol/L (ref 0.5–1.9)

## 2016-09-04 LAB — AMMONIA: Ammonia: <9 umol/L — ABNORMAL LOW (ref 9–35)

## 2016-09-04 LAB — PROCALCITONIN: Procalcitonin: 3.44 ng/mL

## 2016-09-04 LAB — PROTIME-INR
INR: 1.67
Prothrombin Time: 19.9 seconds — ABNORMAL HIGH (ref 11.4–15.2)

## 2016-09-04 LAB — CBG MONITORING, ED: GLUCOSE-CAPILLARY: 276 mg/dL — AB (ref 65–99)

## 2016-09-04 LAB — TROPONIN I: Troponin I: 0.23 ng/mL (ref <0.03)

## 2016-09-04 LAB — TSH: TSH: 0.575 u[IU]/mL (ref 0.350–4.500)

## 2016-09-04 MED ORDER — VANCOMYCIN HCL IN DEXTROSE 1-5 GM/200ML-% IV SOLN: 1000.0000 mg | Freq: Once | INTRAVENOUS | Status: DC

## 2016-09-04 MED ORDER — SODIUM CHLORIDE 0.9 % IV BOLUS (SEPSIS)
1250.0000 mL | Freq: Once | INTRAVENOUS | Status: AC
  Administered 2016-09-04: 1250 mL via INTRAVENOUS

## 2016-09-04 MED ORDER — ACETAMINOPHEN 325 MG PO TABS
650.0000 mg | ORAL_TABLET | Freq: Four times a day (QID) | ORAL | Status: DC | PRN
  Filled 2016-09-04: qty 2

## 2016-09-04 MED ORDER — VANCOMYCIN HCL IN DEXTROSE 750-5 MG/150ML-% IV SOLN: 750.0000 mg | INTRAVENOUS | Status: DC

## 2016-09-04 MED ORDER — VITAMIN D3 25 MCG (1000 UNIT) PO TABS
2000.0000 [IU] | ORAL_TABLET | Freq: Every day | ORAL | Status: DC
  Administered 2016-09-05 – 2016-09-08 (×4): 2000 [IU] via ORAL
  Filled 2016-09-04 (×4): qty 2

## 2016-09-04 MED ORDER — POLYETHYLENE GLYCOL 3350 17 G PO PACK: 17.0000 g | PACK | Freq: Every day | ORAL | Status: DC | PRN

## 2016-09-04 MED ORDER — GABAPENTIN 300 MG PO CAPS
300.0000 mg | ORAL_CAPSULE | Freq: Every day | ORAL | Status: DC
  Administered 2016-09-04 – 2016-09-08 (×5): 300 mg via ORAL
  Filled 2016-09-04 (×5): qty 1

## 2016-09-04 MED ORDER — INSULIN ASPART 100 UNIT/ML ~~LOC~~ SOLN
0.0000 [IU] | Freq: Three times a day (TID) | SUBCUTANEOUS | Status: DC
  Administered 2016-09-05 (×2): 2 [IU] via SUBCUTANEOUS
  Administered 2016-09-05 – 2016-09-06 (×2): 1 [IU] via SUBCUTANEOUS
  Administered 2016-09-06: 2 [IU] via SUBCUTANEOUS
  Administered 2016-09-07: 1 [IU] via SUBCUTANEOUS
  Administered 2016-09-07 – 2016-09-08 (×2): 2 [IU] via SUBCUTANEOUS

## 2016-09-04 MED ORDER — DEXTROSE 5 % IV SOLN
1.0000 g | INTRAVENOUS | Status: DC
  Administered 2016-09-04: 1 g via INTRAVENOUS
  Filled 2016-09-04: qty 1

## 2016-09-04 MED ORDER — SODIUM CHLORIDE 0.9 % IV BOLUS (SEPSIS)
1000.0000 mL | Freq: Once | INTRAVENOUS | Status: AC
  Administered 2016-09-04: 1000 mL via INTRAVENOUS

## 2016-09-04 MED ORDER — PHENOL 1.4 % MT LIQD
1.0000 | OROMUCOSAL | Status: DC | PRN
  Administered 2016-09-04 – 2016-09-05 (×4): 1 via OROMUCOSAL
  Filled 2016-09-04: qty 177

## 2016-09-04 MED ORDER — CLOPIDOGREL BISULFATE 75 MG PO TABS: 75.0000 mg | ORAL_TABLET | Freq: Every day | ORAL | Status: DC

## 2016-09-04 MED ORDER — ALBUTEROL SULFATE (2.5 MG/3ML) 0.083% IN NEBU: 2.5000 mg | INHALATION_SOLUTION | Freq: Four times a day (QID) | RESPIRATORY_TRACT | Status: DC | PRN

## 2016-09-04 MED ORDER — TRAMADOL HCL 50 MG PO TABS
50.0000 mg | ORAL_TABLET | Freq: Four times a day (QID) | ORAL | Status: DC | PRN
  Administered 2016-09-04 – 2016-09-08 (×9): 50 mg via ORAL
  Filled 2016-09-04 (×9): qty 1

## 2016-09-04 MED ORDER — SODIUM CHLORIDE 0.9 % IV SOLN
INTRAVENOUS | Status: DC
  Administered 2016-09-05 – 2016-09-07 (×6): via INTRAVENOUS

## 2016-09-04 MED ORDER — LEVOTHYROXINE SODIUM 88 MCG PO TABS
88.0000 ug | ORAL_TABLET | Freq: Every day | ORAL | Status: DC
  Administered 2016-09-05 – 2016-09-08 (×4): 88 ug via ORAL
  Filled 2016-09-04 (×4): qty 1

## 2016-09-04 MED ORDER — ACETAMINOPHEN 650 MG RE SUPP: 650.0000 mg | Freq: Four times a day (QID) | RECTAL | Status: DC | PRN

## 2016-09-04 MED ORDER — SODIUM CHLORIDE 0.9 % IV BOLUS (SEPSIS)
500.0000 mL | Freq: Once | INTRAVENOUS | Status: AC
  Administered 2016-09-04: 500 mL via INTRAVENOUS

## 2016-09-04 MED ORDER — CARVEDILOL 25 MG PO TABS
25.0000 mg | ORAL_TABLET | Freq: Two times a day (BID) | ORAL | Status: DC
  Administered 2016-09-05 – 2016-09-08 (×8): 25 mg via ORAL
  Filled 2016-09-04 (×2): qty 1
  Filled 2016-09-04 (×4): qty 2
  Filled 2016-09-04 (×2): qty 1
  Filled 2016-09-04: qty 2

## 2016-09-04 MED ORDER — VANCOMYCIN HCL IN DEXTROSE 1-5 GM/200ML-% IV SOLN
1000.0000 mg | Freq: Once | INTRAVENOUS | Status: AC
  Administered 2016-09-04: 1000 mg via INTRAVENOUS
  Filled 2016-09-04: qty 200

## 2016-09-04 MED ORDER — SODIUM CHLORIDE 0.9% FLUSH
3.0000 mL | Freq: Two times a day (BID) | INTRAVENOUS | Status: DC
  Administered 2016-09-05 – 2016-09-08 (×2): 3 mL via INTRAVENOUS

## 2016-09-04 MED ORDER — ALBUTEROL SULFATE HFA 108 (90 BASE) MCG/ACT IN AERS: 2.0000 | INHALATION_SPRAY | Freq: Four times a day (QID) | RESPIRATORY_TRACT | Status: DC | PRN

## 2016-09-04 MED ORDER — AZTREONAM IN DEXTROSE 1 GM/50ML IV SOLN
1.0000 g | Freq: Three times a day (TID) | INTRAVENOUS | Status: DC
  Filled 2016-09-04 (×2): qty 50

## 2016-09-04 MED ORDER — LEVOFLOXACIN IN D5W 750 MG/150ML IV SOLN
750.0000 mg | Freq: Once | INTRAVENOUS | Status: AC
  Administered 2016-09-04: 750 mg via INTRAVENOUS
  Filled 2016-09-04: qty 150

## 2016-09-04 MED ORDER — DEXTROSE 5 % IV SOLN
2.0000 g | Freq: Once | INTRAVENOUS | Status: AC
  Administered 2016-09-04: 2 g via INTRAVENOUS
  Filled 2016-09-04: qty 2

## 2016-09-04 MED ORDER — INSULIN GLARGINE 100 UNIT/ML ~~LOC~~ SOLN
30.0000 [IU] | Freq: Every day | SUBCUTANEOUS | Status: DC
  Administered 2016-09-04 – 2016-09-07 (×4): 30 [IU] via SUBCUTANEOUS
  Filled 2016-09-04 (×5): qty 0.3

## 2016-09-04 MED ORDER — TERAZOSIN HCL 2 MG PO CAPS
2.0000 mg | ORAL_CAPSULE | Freq: Every day | ORAL | Status: DC
  Administered 2016-09-04 – 2016-09-08 (×5): 2 mg via ORAL
  Filled 2016-09-04 (×5): qty 1

## 2016-09-04 MED ORDER — LEVOFLOXACIN IN D5W 750 MG/150ML IV SOLN: 750.0000 mg | INTRAVENOUS | Status: DC

## 2016-09-04 MED ORDER — ACETAMINOPHEN 650 MG RE SUPP
650.0000 mg | Freq: Once | RECTAL | Status: AC
  Administered 2016-09-04: 650 mg via RECTAL
  Filled 2016-09-04: qty 1

## 2016-09-04 NOTE — Progress Notes (Addendum)
Pharmacy Antibiotic Note  Anthony Mcfarland is a 66 y.o. male admitted on 09/04/2016 with sepsis.   He presents from rehab facility with pain & hematuria.  He had R ureteral stent placed ~2 weeks ago.  Pharmacy has been consulted for Vancomycin, Levaquin, & Aztreonam dosing. Amoxicillin allergy with unknown reaction noted, however patient has tolerated Ceftin in the past.    09/04/2016:   Febrile- Tm 102.14F  Elevated WBC- 28.8  Lactic acid 3.73  AKI- Scr 2.66, est CrCl~10ml/min  Tachycardic  Plan:  Aztreonam 2gm IV x1 in ED then 1gm IV q8h  Levaquin 750mg  IV q48h  Vancomycin 1gm IV x1 in ED then 750mg  IV q24h  Check Vancomycin trough at steady state  Daily Scr while on Vancomycin  F/U cx data & patient progress  Consider change Aztreonam & Levaquin to Cefepime 1gm IV q24h  Height: 6\' 1"  (185.4 cm) Weight: 160 lb (72.6 kg) IBW/kg (Calculated) : 79.9  Temp (24hrs), Avg:99.4 F (37.4 C), Min:97.7 F (36.5 C), Max:102.7 F (39.3 C)   Recent Labs Lab 09/04/16 1435 09/04/16 1445  WBC 28.8*  --   CREATININE 2.66*  --   LATICACIDVEN  --  3.73*    Estimated Creatinine Clearance: 28.1 mL/min (A) (by C-G formula based on SCr of 2.66 mg/dL (H)).    Allergies  Allergen Reactions  . Grapefruit Extract Other (See Comments)    [facility] told him not to eat  . Amoxicillin Other (See Comments)    unknown    Antimicrobials this admission: 3/28 Aztreonam >>  3/28 Levaquin >>  3/28 Vanc >>  Dose adjustments this admission:  Microbiology results: 3/28 BCx: sent 3/28 UCx: ordered   Thank you for allowing pharmacy to be a part of this patient's care.  Anthony Mcfarland, Lavonia Drafts   Addendum: Abx changed on admission to Vanc/Cefepime as recommended.   Cont Vanc as previously ordered Start Cefepime 1gm IV q24h (adjusted for renal fxn)  Netta Cedars, PharmD, BCPS Pager: [phone] 09/04/2016@7 :46 PM  09/04/2016 4:32 PM

## 2016-09-04 NOTE — H&P (Signed)
History and Physical    Calvin Jablonowski SWF:[id number] DOB: [birth year] DOA: 09/04/2016  PCP: Ronnie Doss, MD   Patient coming from: Home  Chief Complaint: Abdominal Pain, Nausea and Vomting  HPI: Anthony Mcfarland is a 66 y.o. male with medical history significant of Atrial Flutter s/p Ablation x2 and Cardioversion, CAD, Insulin Dependent Diabetes Mellitus x2, Hepatitis s/p Treatment with Harvoni, Liver Cirrhosis with Ascites, Anemia of Chronic Disease and other comorbid conditions who presented to [facility] with a cc of of Left Sided Abdominal Pain. Patient states he recently had a Right Ureteral Stent 2 weeks ago and subsequently developed sharp abdominal pain that was intermittent with no clear onset. Patient states it felt like a "knife" sometimes and states he had associated Nausea and bilious non-bloody Vomiting since yesterday. Of note patient was recently admitted for Pancolitis and Rectal Bleeding. Patient did have fevers and chills as well. Had no bloody stools but daughter became concerned and noticed that he had gross hematuria so she brought him to the ED for evaluation.   ED Course: Had Bloodwork done and given 2.25 Liter bolus and CT of Abdomen and Pelvis. Of note [facility] was called to admit patient for UTI without Urinalysis and after patient had already received IV Antibiotics.   Review of Systems: As per HPI otherwise 10 point review of systems negative. Denied CP, SOB, Lightheadedness or Dizziness.   Past Medical History:  Diagnosis Date  . Anemia in chronic illness 04/13/2014  . Atrial flutter [facility])    s/p ablation 03/2016 and 06/2016 at [facility].    Marland Kitchen Complication of anesthesia    takes awhile to wake up due to kidney and liver functions  . Coronary artery disease   . Diabetes mellitus without complication ([place])   . Hepatitis C    This was treated in late 2015 with anti-viral medications overseen at [facility].   Past Surgical History:  Procedure Laterality Date  . CARDIAC  DEFIBRILLATOR PLACEMENT    . CHOLECYSTECTOMY    . COLONOSCOPY N/A 07/22/2016   Procedure: COLONOSCOPY;  Surgeon: Mauri Pole, MD;  Location: [facility] ENDOSCOPY;  Service: Endoscopy;  Laterality: N/A;  . ESOPHAGOGASTRODUODENOSCOPY N/A 07/19/2016   Procedure: ESOPHAGOGASTRODUODENOSCOPY (EGD);  Surgeon: Milus Banister, MD;  Location: [place];  Service: Endoscopy;  Laterality: N/A;  . kidney stent Right 2018  . right leg ankle surgery     SOCIAL HISTORY  reports that he has never smoked. He has never used smokeless tobacco. He reports that he uses drugs, including Marijuana. He reports that he does not drink alcohol.  Allergies  Allergen Reactions  . Grapefruit Extract Other (See Comments)    [facility] told him not to eat  . Amoxicillin Other (See Comments)    Unknown reaction  Has patient had a PCN reaction causing immediate rash, facial/tongue/throat swelling, SOB or lightheadedness with hypotension: unknown Has patient had a PCN reaction causing severe rash involving mucus membranes or skin necrosis: unknown Has patient had a PCN reaction that required hospitalization: unknown Has patient had a PCN reaction occurring within the last 10 years: unknown If all of the above answers are "NO", then may proceed with Cephalosporin use.     Family History  Problem Relation Age of Onset  . Cancer Mother     colon cancer   Prior to Admission medications   Medication Sig Start Date End Date Taking? Authorizing Provider  albuterol (PROVENTIL HFA;VENTOLIN HFA) 108 (90 Base) MCG/ACT inhaler Inhale 2 puffs into  the lungs every 6 (six) hours as needed for wheezing or shortness of breath.   Yes Historical Provider, MD  amLODipine (NORVASC) 10 MG tablet Take 10 mg by mouth daily.   Yes Historical Provider, MD  carvedilol (COREG) 25 MG tablet Take 25 mg by mouth 2 (two) times daily with a meal.   Yes Historical Provider, MD  cholecalciferol (VITAMIN D) 1000 units tablet Take 2,000 Units by mouth  daily.   Yes Historical Provider, MD  clopidogrel (PLAVIX) 75 MG tablet Take 1 tablet (75 mg total) by mouth daily. 07/29/16  Yes Reyne Dumas, MD  furosemide (LASIX) 20 MG tablet Take 20 mg by mouth daily.   Yes Historical Provider, MD  gabapentin (NEURONTIN) 300 MG capsule Take 1 capsule (300 mg total) by mouth at bedtime. Patient taking differently: Take 600 mg by mouth 3 (three) times daily.  04/14/14  Yes Ripudeep Krystal Eaton, MD  insulin glargine (LANTUS) 100 UNIT/ML injection Inject 0.3 mLs (30 Units total) into the skin at bedtime. 04/14/14  Yes Ripudeep Krystal Eaton, MD  levothyroxine (SYNTHROID, LEVOTHROID) 88 MCG tablet Take 88 mcg by mouth daily before breakfast.   Yes Historical Provider, MD  lisinopril (PRINIVIL,ZESTRIL) 40 MG tablet Take 40 mg by mouth daily.   Yes Historical Provider, MD  apixaban (ELIQUIS) 5 MG TABS tablet Take 1 tablet (5 mg total) by mouth 2 (two) times daily. Patient not taking: Reported on 09/04/2016 07/29/16   Reyne Dumas, MD  pantoprazole (PROTONIX) 40 MG tablet Take 1 tablet (40 mg total) by mouth daily at 6 (six) AM. Patient not taking: Reported on 09/04/2016 07/23/16   Reyne Dumas, MD  terazosin (HYTRIN) 2 MG capsule Take 1 mg by mouth at bedtime.     Historical Provider, MD    Physical Exam: Vitals:   09/04/16 1745 09/04/16 1800 09/04/16 1815 09/04/16 1830  BP: (!) 159/82 123/73 (!) 145/72 120/71  Pulse: (!) 103 (!) 102 98   Resp: (!) 28 (!) 22 (!) 21 17  Temp:      TempSrc:      SpO2: 96% 99% 100%   Weight:      Height:       Constitutional: NAD and appears calm but uncomfortable due to abdominal Pain Eyes: Lids and conjunctivae normal, sclerae anicteric  ENMT: External Ears, Nose appear normal. Grossly normal hearing. Neck: Appears normal, supple, no cervical masses, normal ROM, no appreciable thyromegaly, no JVD Respiratory: Diminished to auscultation bilaterally, no wheezing, rales, rhonchi or crackles. Normal respiratory effort and patient is not  tachypenic. No accessory muscle use.  Cardiovascular: Tachycardic heart rate but was Sinus, no murmurs / rubs / gallops. S1 and S2 auscultated. No extremity edema.  Abdomen: Soft, tender to palpate in the Left Side upper and lower qudarant, non-distended. No masses palpated. No appreciable hepatosplenomegaly or ascites. Bowel sounds positive x4.  GU: Deferred. Musculoskeletal: No clubbing / cyanosis of digits/nails. No joint deformity upper and lower extremities.  Skin: No rashes, lesions, ulcers Patient had discoloration of legs and oncomycosis. No induration; Warm and dry.  Neurologic: CN 2-12 grossly intact with no focal deficits. Romberg sign cerebellar reflexes not assessed.  Psychiatric: Normal judgment and insight. Alert and oriented x 3. Normal mood and appropriate affect.   Labs on Admission: I have personally reviewed following labs and imaging studies  CBC:  Recent Labs Lab 09/04/16 1435  WBC 28.8*  NEUTROABS 26.6*  HGB 10.8*  HCT 32.0*  MCV 74.1*  PLT 115*   Basic  Metabolic Panel:  Recent Labs Lab 09/04/16 1435  NA 131*  K 4.2  CL 99*  CO2 18*  GLUCOSE 278*  BUN 51*  CREATININE 2.66*  CALCIUM 9.2   GFR: Estimated Creatinine Clearance: 28.1 mL/min (A) (by C-G formula based on SCr of 2.66 mg/dL (H)). Liver Function Tests:  Recent Labs Lab 09/04/16 1435  AST 29  ALT 19  ALKPHOS 64  BILITOT 2.3*  PROT 9.2*  ALBUMIN 3.5    Recent Labs Lab 09/04/16 1435  LIPASE 24    Recent Labs Lab 09/04/16 1435  AMMONIA <9*   Coagulation Profile:  Recent Labs Lab 09/04/16 1435  INR 1.67   Cardiac Enzymes: No results for input(s): CKTOTAL, CKMB, CKMBINDEX, TROPONINI in the last 168 hours. BNP (last 3 results) No results for input(s): PROBNP in the last 8760 hours. HbA1C: No results for input(s): HGBA1C in the last 72 hours. CBG:  Recent Labs Lab 09/04/16 1532  GLUCAP 276*   Lipid Profile: No results for input(s): CHOL, HDL, LDLCALC, TRIG,  CHOLHDL, LDLDIRECT in the last 72 hours. Thyroid Function Tests: No results for input(s): TSH, T4TOTAL, FREET4, T3FREE, THYROIDAB in the last 72 hours. Anemia Panel: No results for input(s): VITAMINB12, FOLATE, FERRITIN, TIBC, IRON, RETICCTPCT in the last 72 hours. Urine analysis:    Component Value Date/Time   COLORURINE YELLOW 09/04/2016 1810   APPEARANCEUR HAZY (A) 09/04/2016 1810   LABSPEC 1.017 09/04/2016 1810   PHURINE 6.0 09/04/2016 1810   GLUCOSEU 150 (A) 09/04/2016 1810   HGBUR LARGE (A) 09/04/2016 1810   BILIRUBINUR NEGATIVE 09/04/2016 1810   KETONESUR 5 (A) 09/04/2016 1810   PROTEINUR 100 (A) 09/04/2016 1810   UROBILINOGEN 2.0 (H) 04/11/2014 2249   NITRITE NEGATIVE 09/04/2016 1810   LEUKOCYTESUR LARGE (A) 09/04/2016 1810   Sepsis Labs: !!!!!!!!!!!!!!!!!!!!!!!!!!!!!!!!!!!!!!!!!!!! @LABRCNTIP (procalcitonin:4,lacticidven:4) )No results found for this or any previous visit (from the past 240 hour(s)).   Radiological Exams on Admission: Ct Abdomen Pelvis Wo Contrast  Result Date: 09/04/2016 CLINICAL DATA:  Hematuria and flank pain beginning this morning. History of pneumonia, kidney stones, diabetes, hepatitis-C. EXAM: CT ABDOMEN AND PELVIS WITHOUT CONTRAST TECHNIQUE: Multidetector CT imaging of the abdomen and pelvis was performed following the standard protocol without IV contrast. COMPARISON:  Abdominal ultrasound July 18, 2016 FINDINGS: Respiratory motion degraded examination LOWER CHEST: Lung bases are clear. Included heart is moderately enlarged, GI CT incompletely assessed. No pericardial effusion. HEPATOBILIARY: Status post cholecystectomy. Mildly nodular liver contour. PANCREAS: Normal. SPLEEN: Normal. ADRENALS/URINARY TRACT: Kidneys are orthotopic, demonstrating normal size and morphology. Double-pigtail nephroureteral stent with well-positioned retaining loops in RIGHT renal pelvis and bladder. 3 mm RIGHT lower pole nephrolithiasis. Limited assessment for renal masses  on this nonenhanced examination. The unopacified ureters are normal in course and caliber. Urinary bladder is partially distended and unremarkable. Normal adrenal glands. STOMACH/BOWEL: The stomach, small and large bowel are normal in course and caliber, sensitivity decreased by lack of enteric contrast. Focal fluid adjacent versus within RIGHT hepatic flexure with suspected bowel wall thickening. VASCULAR/LYMPHATIC: Aortoiliac vessels are normal in course and caliber, mild calcific atherosclerosis. No lymphadenopathy by CT size criteria though limited sensitivity due to motion and noncontrast examination. REPRODUCTIVE: Mild prostatomegaly. OTHER: Trace free fluid in the pelvis and RIGHT pericolic gutter. MUSCULOSKELETAL: Non-acute. Small fat containing inguinal hernias. Osteopenia. Moderate degenerative change of the hips. IMPRESSION: 3 mm nonobstructing RIGHT nephrolithiasis. Well-positioned RIGHT nephroureteral stent. Focal fluid within the RIGHT upper quadrant likely extraluminal adjacent to RIGHT splenic flexure, bowel pathology possible abscess not  confirmed by noncontrast respiratory motion degraded examination. Recommend follow-up CT with enteric contrast when patient able to remain still (and consider intravenous contrast within patient's renal status improves). Recommend colonoscopy after resolution of acute symptoms. Cirrhosis, status post cholecystectomy. Electronically Signed   By: Elon Alas M.D.   On: 09/04/2016 17:25   Dg Chest Port 1 View  Result Date: 09/04/2016 CLINICAL DATA:  Hematuria and flank pain since this morning. EXAM: PORTABLE CHEST 1 VIEW COMPARISON:  07/17/2016 FINDINGS: Artifact overlies chest. Pacemaker/AICD in place. Chronic left ventricular prominence. Chronic aortic atherosclerosis. The patient has taken a poor inspiration. There is chronic scarring on the left related to previous surgery. No sign of active infiltrate, collapse or effusion. IMPRESSION: Poor inspiration.  Chronic left ventricular prominence. Pacemaker/ AICD. Chronic pulmonary scarring. No active process evident. Electronically Signed   By: Nelson Chimes M.D.   On: 09/04/2016 15:59    EKG: No EKG done on Admission in ED; Will Obtain Admission EKG.   Assessment/Plan Principal Problem:   Sepsis secondary to UTI [facility]) Active Problems:   Hyperglycemia   Thrombocytopenia (HCC)   Coronary artery disease   Anemia   Cirrhosis (HCC)   Intra-abdominal fluid collection - R flank ? abscess  Sepsis 2/2 to unclear Etiology suspected Abdominal Abscess or UTI -Patient was febrile at 102, Tachycardic at 129, RR of 26, WBC of 28.8 and suspected infection of UTI (though no Urinalysis or Urine Cx was drawn prior to initiation of Abx) because of recent Right Ureteral Stent -Sepsis Protocol Initiated and patient received 2.25 Liters in ED (30 cc/kg) -Unfortunately EDP gave patient Abx before obtaining Urinalysis or Urine Cx -Blood Cx's were also drawn after Abx were initiated -Lactic Acid Level was elevated at 3.73 and trending down to 2.89 -Will obtain Procalcitonin -CXR showed poor Inspiration. Chronic Left Ventricular Prominence with poor inspiration, Chronic Pulmonary Scarring, and a Pacemaker/AICD present with no active process  -CT Scan of Abd/Pelvis showed 3 mm Non-Obstructing Right Nephrolithiasis and well positioned Right Nephroureteral Stent as well as a Focal Fluid within the Right Upper Quadrant likely extraluminal adjacent to Right Splenic Flexure/Hepatic Flexure, bowel pathology and possible abscess -Will repeat CT Abd/Pelvis with po Contrast -> Recommends colonoscopy after resolution of symptoms -**Patient recently had colonoscopy 07/22/16 and showed a 7 mm polyp in the transverse colon and erythematous mucosa in the ascending colon in the cecum that was biopsied and there examination was otherwise normal -General Surgery Consulted by EDP and patient being seen by Dr. Johney Maine -Urology also consulted  by EDP and waiting for evaluation and Recc's -C/w Broad Spectrum Abx of IV Cefepime and IV Vancomycin -C/w Maintenance Fluid with NS at 100 mL/hr -Urinalysis done finally and showed Many Bacteria, Large Blood with TNTC RBC, Large Leukocytes, Negative Nitrites, and TNTC WBC -Have requested Urine Cx  AKI on suspected CKD Stage 3 -Upon Review of Prior Records Patient's Baseline Cr appears to be 1.2-1.4 -Patient's BUN/Cr on Admission is 51/2.66 -C/w IVF with NS at 100 mL/hr -Avoid Nephrotoxic Medications and will hold Furosemide 20 mg po Daily and Lisinopril 40 mg daily -Check Bladder Scan -Appreciate Urology Input as patient recently had Right Ureteral Stent and now having Hematuria -Repeat CMP in AM  Hematuria -Patient's Urinalysis showed Large Hgb and TNTC RBC -Check UDS -Since Patient recently had Right Ureteral Stent appreciate Urology Evaluation and Recc's -Will obtain Bladder Scan  Hx of Atrial Flutter s/p Ablation x2 and Cardioversion currently in Sinus Tachycardia -No longer on Apixaban -C/w Carvedilol  25 mg po BID -Monitor on Telemetry  CAD  -No Chest Pain or Anginal Symptoms -C/w Plavix 75 mg po Daily, Carvedilol 25 mg po BID; Hold Lisinopril 40 mg daily   Insulin Dependent Diabetes Mellitus Type 2 -C/w Home Lantus 30 units at bedtime -Will add Sensitive Novolog SSI -Consult Diabetic Education Coordinator  Liver Cirrhosis and Associated Ascites / Hepatitis C s/p Harvoni  -LFT's wnL; TBili was elevated 2.3 -CT Scan showed liver Cirrhosis -Did not really appreciate Asictes on Exam  Anemia of Chronic Disease -Hb/Hct was 10.8/32.0 -Repeat CMP in AM -Patient's Daugther states he was having frank blood in Urine -Continue to Monitor  Hypothyroidism -Check TSH and Free T4 -C/w Levothyroxine 88 mcg daily  Thrombocytopenia -Likely from Liver Cirrhosis -Continue to Monitor Closely and repeat CBC in AM  DVT prophylaxis: SCD's Code Status: FULL CODE Family  Communication: Discussed with Daughter at Bedside Disposition Plan: Will get PT Eval when patient Stable. Likely SNF Consults called: General Surgery Dr. Johney Maine, Urology Dr. Junious Silk Admission status: Inpatient SDU  Kerney Elbe, D.O. [facility] Pager [phone]  If 7PM-7AM, please contact night-coverage [email or web address] Password [facility]  09/04/2016, 7:24 PM

## 2016-09-04 NOTE — ED Notes (Signed)
Pt transported to CT ?

## 2016-09-04 NOTE — ED Triage Notes (Signed)
Pt attempted to give urine sample in triage but was unable to produce enough urine.

## 2016-09-04 NOTE — ED Notes (Signed)
Provided pillow for comfort.

## 2016-09-04 NOTE — ED Notes (Signed)
Dr. Ascencion Dike aware of patients temperature and condition.

## 2016-09-04 NOTE — ED Provider Notes (Signed)
[facility] DEPT Provider Note   CSN: [id number] Arrival date & time: 09/04/16  1140     History   Chief Complaint Chief Complaint  Patient presents with  . Flank Pain  . Hematuria    HPI Anthony Mcfarland is a 66 y.o. male.  HPI Patient recently admitted for pancolitis and rectal bleeding. Had ureteral stent placed 2 weeks ago on the right. Recently discharged from rehabilitation facility. Patient had increasing pain for the past days. Pain is mostly in the left lower quadrant and left flank. He's had subjective fevers and chills. Complains of one day of nausea and vomiting. No diarrhea or blood in stool. Noted to have gross hematuria today. This prompted daughter to bring patient to the emergency department. Past Medical History:  Diagnosis Date  . Anemia in chronic illness 04/13/2014  . Atrial flutter [facility])    s/p ablation 03/2016 and 06/2016 at [facility].    Marland Kitchen Complication of anesthesia    takes awhile to wake up due to kidney and liver functions  . Coronary artery disease   . Diabetes mellitus without complication ([place])   . Hepatitis C    This was treated in late 2015 with anti-viral medications overseen at [facility].    Patient Active Problem List   Diagnosis Date Noted  . Sepsis ([place]) 09/04/2016  . Intra-abdominal fluid collection - R flank ? abscess 09/04/2016  . Polyp of transverse colon   . 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 Gastritis and gastroduodenitis   . Gastrointestinal hemorrhage 07/18/2016  . Ascites   . Cirrhosis ([place])   . Thrombocytopenia ([place]) 04/13/2014  . DM (diabetes mellitus) type 2, uncontrolled, with ketoacidosis ([place]) 04/13/2014  . Anemia 04/13/2014  . Coronary artery disease   . Hyperglycemia 04/12/2014    Past Surgical History:  Procedure Laterality Date  . CARDIAC DEFIBRILLATOR PLACEMENT    . CHOLECYSTECTOMY    . COLONOSCOPY N/A 07/22/2016   Procedure: COLONOSCOPY;  Surgeon: Mauri Pole, MD;  Location: [facility]  ENDOSCOPY;  Service: Endoscopy;  Laterality: N/A;  . ESOPHAGOGASTRODUODENOSCOPY N/A 07/19/2016   Procedure: ESOPHAGOGASTRODUODENOSCOPY (EGD);  Surgeon: Milus Banister, MD;  Location: [facility];  Service: Endoscopy;  Laterality: N/A;  . kidney stent Right 2018  . right leg ankle surgery         Home Medications    Prior to Admission medications   Medication Sig Start Date End Date Taking? Authorizing Provider  albuterol (PROVENTIL HFA;VENTOLIN HFA) 108 (90 Base) MCG/ACT inhaler Inhale 2 puffs into the lungs every 6 (six) hours as needed for wheezing or shortness of breath.   Yes Historical Provider, MD  amLODipine (NORVASC) 10 MG tablet Take 10 mg by mouth daily.   Yes Historical Provider, MD  carvedilol (COREG) 25 MG tablet Take 25 mg by mouth 2 (two) times daily with a meal.   Yes Historical Provider, MD  cholecalciferol (VITAMIN D) 1000 units tablet Take 2,000 Units by mouth daily.   Yes Historical Provider, MD  clopidogrel (PLAVIX) 75 MG tablet Take 1 tablet (75 mg total) by mouth daily. 07/29/16  Yes Reyne Dumas, MD  furosemide (LASIX) 20 MG tablet Take 20 mg by mouth daily.   Yes Historical Provider, MD  gabapentin (NEURONTIN) 300 MG capsule Take 1 capsule (300 mg total) by mouth at bedtime. Patient taking differently: Take 600 mg by mouth 3 (three) times daily.  04/14/14  Yes Ripudeep Krystal Eaton, MD  insulin glargine (LANTUS) 100 UNIT/ML injection Inject 0.3  mLs (30 Units total) into the skin at bedtime. 04/14/14  Yes Ripudeep Krystal Eaton, MD  levothyroxine (SYNTHROID, LEVOTHROID) 88 MCG tablet Take 88 mcg by mouth daily before breakfast.   Yes Historical Provider, MD  lisinopril (PRINIVIL,ZESTRIL) 40 MG tablet Take 40 mg by mouth daily.   Yes Historical Provider, MD  terazosin (HYTRIN) 2 MG capsule Take 1 mg by mouth at bedtime.     Historical Provider, MD    Family History Family History  Problem Relation Age of Onset  . Cancer Mother     colon cancer    Social History Social History   Substance Use Topics  . Smoking status: Never Smoker  . Smokeless tobacco: Never Used  . Alcohol use No     Allergies   Grapefruit extract and Amoxicillin   Review of Systems Review of Systems  Constitutional: Positive for appetite change, chills, diaphoresis, fatigue and fever.  Respiratory: Negative for cough and shortness of breath.   Cardiovascular: Negative for chest pain.  Gastrointestinal: Positive for abdominal pain, constipation, nausea and vomiting. Negative for abdominal distention, blood in stool and diarrhea.  Genitourinary: Positive for difficulty urinating, flank pain and hematuria. Negative for dysuria.  Musculoskeletal: Positive for back pain and myalgias. Negative for neck pain and neck stiffness.  Skin: Negative for rash and wound.  Neurological: Positive for weakness (generalized). Negative for dizziness, light-headedness, numbness and headaches.  All other systems reviewed and are negative.    Physical Exam Updated Vital Signs BP (!) 150/93 (BP Location: Left Arm)   Pulse (!) 107   Temp (!) 102 F (38.9 C) (Rectal)   Resp (!) 24   Ht 6\' 1"  (1.854 m)   Wt 160 lb (72.6 kg)   SpO2 97%   BMI 21.11 kg/m   Physical Exam  Constitutional: He is oriented to person, place, and time. He appears well-developed and well-nourished. He appears distressed.  HENT:  Head: Normocephalic and atraumatic.  Mouth/Throat: Oropharynx is clear and moist.  Eyes: EOM are normal. Pupils are equal, round, and reactive to light.  Neck: Normal range of motion. Neck supple.  No meningismus  Cardiovascular: Normal rate and regular rhythm.   Pulmonary/Chest: Effort normal and breath sounds normal. No respiratory distress. He has no wheezes. He has no rales. He exhibits no tenderness.  Abdominal: Soft. There is tenderness. There is no rebound and no guarding.  Decreased bowel sounds throughout. Patient is generally tender to palpation. Patient was prominent in the left lower  quadrant. Rebound tenderness is present.   Musculoskeletal: Normal range of motion. He exhibits tenderness. He exhibits no edema.  Patient has left CVA tenderness to percussion. No lower extremity swelling or asymmetry. Distal pulses intact.  Neurological: He is alert and oriented to person, place, and time.  Patient appears mildly lethargic. Moving all extremities without focal deficit. Sensation fully intact.  Skin: Skin is warm and dry. Capillary refill takes less than 2 seconds. No rash noted. No erythema.  Psychiatric: He has a normal mood and affect. His behavior is normal.  Nursing note and vitals reviewed.    ED Treatments / Results  Labs (all labs ordered are listed, but only abnormal results are displayed) Labs Reviewed  CBC WITH DIFFERENTIAL/PLATELET - Abnormal; Notable for the following:       Result Value   WBC 28.8 (*)    Hemoglobin 10.8 (*)    HCT 32.0 (*)    MCV 74.1 (*)    MCH 25.0 (*)  RDW 16.9 (*)    Platelets 115 (*)    Neutro Abs 26.6 (*)    Lymphs Abs 0.5 (*)    Monocytes Absolute 1.6 (*)    All other components within normal limits  COMPREHENSIVE METABOLIC PANEL - Abnormal; Notable for the following:    Sodium 131 (*)    Chloride 99 (*)    CO2 18 (*)    Glucose, Bld 278 (*)    BUN 51 (*)    Creatinine, Ser 2.66 (*)    Total Protein 9.2 (*)    Total Bilirubin 2.3 (*)    GFR calc non Af Amer 23 (*)    GFR calc Af Amer 27 (*)    All other components within normal limits  PROTIME-INR - Abnormal; Notable for the following:    Prothrombin Time 19.9 (*)    All other components within normal limits  AMMONIA - Abnormal; Notable for the following:    Ammonia <9 (*)    All other components within normal limits  I-STAT CG4 LACTIC ACID, ED - Abnormal; Notable for the following:    Lactic Acid, Venous 3.73 (*)    All other components within normal limits  CBG MONITORING, ED - Abnormal; Notable for the following:    Glucose-Capillary 276 (*)    All other  components within normal limits  I-STAT CG4 LACTIC ACID, ED - Abnormal; Notable for the following:    Lactic Acid, Venous 2.89 (*)    All other components within normal limits  CULTURE, BLOOD (ROUTINE X 2)  CULTURE, BLOOD (ROUTINE X 2)  LIPASE, BLOOD  URINALYSIS, ROUTINE W REFLEX MICROSCOPIC  CREATININE, SERUM  LACTIC ACID, PLASMA  LACTIC ACID, PLASMA    EKG  EKG Interpretation None       Radiology Ct Abdomen Pelvis Wo Contrast  Result Date: 09/04/2016 CLINICAL DATA:  Hematuria and flank pain beginning this morning. History of pneumonia, kidney stones, diabetes, hepatitis-C. EXAM: CT ABDOMEN AND PELVIS WITHOUT CONTRAST TECHNIQUE: Multidetector CT imaging of the abdomen and pelvis was performed following the standard protocol without IV contrast. COMPARISON:  Abdominal ultrasound July 18, 2016 FINDINGS: Respiratory motion degraded examination LOWER CHEST: Lung bases are clear. Included heart is moderately enlarged, GI CT incompletely assessed. No pericardial effusion. HEPATOBILIARY: Status post cholecystectomy. Mildly nodular liver contour. PANCREAS: Normal. SPLEEN: Normal. ADRENALS/URINARY TRACT: Kidneys are orthotopic, demonstrating normal size and morphology. Double-pigtail nephroureteral stent with well-positioned retaining loops in RIGHT renal pelvis and bladder. 3 mm RIGHT lower pole nephrolithiasis. Limited assessment for renal masses on this nonenhanced examination. The unopacified ureters are normal in course and caliber. Urinary bladder is partially distended and unremarkable. Normal adrenal glands. STOMACH/BOWEL: The stomach, small and large bowel are normal in course and caliber, sensitivity decreased by lack of enteric contrast. Focal fluid adjacent versus within RIGHT hepatic flexure with suspected bowel wall thickening. VASCULAR/LYMPHATIC: Aortoiliac vessels are normal in course and caliber, mild calcific atherosclerosis. No lymphadenopathy by CT size criteria though limited  sensitivity due to motion and noncontrast examination. REPRODUCTIVE: Mild prostatomegaly. OTHER: Trace free fluid in the pelvis and RIGHT pericolic gutter. MUSCULOSKELETAL: Non-acute. Small fat containing inguinal hernias. Osteopenia. Moderate degenerative change of the hips. IMPRESSION: 3 mm nonobstructing RIGHT nephrolithiasis. Well-positioned RIGHT nephroureteral stent. Focal fluid within the RIGHT upper quadrant likely extraluminal adjacent to RIGHT splenic flexure, bowel pathology possible abscess not confirmed by noncontrast respiratory motion degraded examination. Recommend follow-up CT with enteric contrast when patient able to remain still (and consider intravenous contrast within patient's  renal status improves). Recommend colonoscopy after resolution of acute symptoms. Cirrhosis, status post cholecystectomy. Electronically Signed   By: Elon Alas M.D.   On: 09/04/2016 17:25   Dg Chest Port 1 View  Result Date: 09/04/2016 CLINICAL DATA:  Hematuria and flank pain since this morning. EXAM: PORTABLE CHEST 1 VIEW COMPARISON:  07/17/2016 FINDINGS: Artifact overlies chest. Pacemaker/AICD in place. Chronic left ventricular prominence. Chronic aortic atherosclerosis. The patient has taken a poor inspiration. There is chronic scarring on the left related to previous surgery. No sign of active infiltrate, collapse or effusion. IMPRESSION: Poor inspiration. Chronic left ventricular prominence. Pacemaker/ AICD. Chronic pulmonary scarring. No active process evident. Electronically Signed   By: Nelson Chimes M.D.   On: 09/04/2016 15:59    Procedures Procedures (including critical care time)  Medications Ordered in ED Medications  levofloxacin (LEVAQUIN) IVPB 750 mg (750 mg Intravenous New Bag/Given 09/04/16 1719)  vancomycin (VANCOCIN) IVPB 1000 mg/200 mL premix (not administered)  aztreonam (AZACTAM) 1 GM IVPB (not administered)  levofloxacin (LEVAQUIN) IVPB 750 mg (not administered)  vancomycin  (VANCOCIN) IVPB 750 mg/150 ml premix (not administered)  sodium chloride 0.9 % bolus 1,000 mL (0 mLs Intravenous Stopped 09/04/16 1719)  aztreonam (AZACTAM) 2 g in dextrose 5 % 50 mL IVPB (0 g Intravenous Stopped 09/04/16 1719)  sodium chloride 0.9 % bolus 1,250 mL (0 mLs Intravenous Stopped 09/04/16 1719)  acetaminophen (TYLENOL) suppository 650 mg (650 mg Rectal Given 09/04/16 1606)   CRITICAL CARE Performed by: Lita Mains, Boyce Keltner Total critical care time: 50 minutes Critical care time was exclusive of separately billable procedures and treating other patients. Critical care was necessary to treat or prevent imminent or life-threatening deterioration. Critical care was time spent personally by me on the following activities: development of treatment plan with patient and/or surrogate as well as nursing, discussions with consultants, evaluation of patient's response to treatment, examination of patient, obtaining history from patient or surrogate, ordering and performing treatments and interventions, ordering and review of laboratory studies, ordering and review of radiographic studies, pulse oximetry and re-evaluation of patient's condition.  Initial Impression / Assessment and Plan / ED Course  I have reviewed the triage vital signs and the nursing notes.  Pertinent labs & imaging results that were available during my care of the patient were reviewed by me and considered in my medical decision making (see chart for details).  Clinical Course as of Sep 05 1838  Wed Sep 04, 2016  1824 CT Abdomen Pelvis Wo Contrast [DY]    Clinical Course User Index [DY] Julianne Rice, MD   Patient initially afebrile with only tachycardia. Elevation in lacate. Repeat temperature check with fever to 102. Tachycardia has improved after initial IV fluid bolus. Patient is now much more alert. Initiated sepsis protocol fluid bolus and broad-spectrum antibiotics.  Lactic acid has improved over her initial draw. CT  abdomen with evidence of fluid collection in the right upper quadrant. Some associated bowel wall thickening. Patient has a right-sided renal stone with stent in place. Discussed with hospitalist will see patient in the emergency department and admit. Will consult general surgery and urology.  Discussed with general surgery, Dr. Johney Maine and urology Dr. Junious Silk. Both will consult on the patient Final Clinical Impressions(s) / ED Diagnoses   Final diagnoses:  Sepsis with acute organ dysfunction ([place])  AKI (acute kidney injury) ([place])  Gross hematuria    New Prescriptions New Prescriptions   No medications on file     Julianne Rice, MD 09/04/16 1840

## 2016-09-04 NOTE — ED Notes (Signed)
Pt has urinated 258mL of urine in urinal. Notified Dr. Ascencion Dike that patient has urinated and will hold on placing Foley Cath.

## 2016-09-04 NOTE — Consult Note (Addendum)
History of Present Illness:  Pt was admitted for abdominal pain and sepsis. He underwent a stent at [facility] 2-3 weeks ago. He's had some mild hematuria consistent with a stent. No dysuria or trouble voiding. CT showed stent in good position with a small stone, 53mm, in RLP and likely a 7 mm right proximal ureteral stone. No hydro. No renal/RP abscess. No clot in bladder. There was some fluid/possible abscess around right colon/paracolic and Dr. Johney Maine is evaluating the patient. Prostate appeared normal. I reviewed the images. Pt was admitted last month to [facility] for pancolitis and rectal bleeding and underwent a c-scope prior to [place] procedure.   The patient was not clear on all the details of the right ureteral stent and follow-up. I called and spoke with his daughter who is very involved in his care to make sure the stent hasn't been in too long ( ie months/years) and that he had appropriate follow-up. Indeed, the stent was recently placed at [facility] for a ureteral stone. He's scheduled for surgery with urology October 06, 2016.    Past Medical History:  Diagnosis Date  . Anemia in chronic illness 04/13/2014  . Atrial flutter [facility])    s/p ablation 03/2016 and 06/2016 at [facility]    Marland Kitchen Complication of anesthesia    takes awhile to wake up due to kidney and liver functions  . Coronary artery disease   . Diabetes mellitus without complication ([place])   . Hepatitis C    This was treated in late 2015 with anti-viral medications overseen at [facility].   Past Surgical History:  Procedure Laterality Date  . CARDIAC DEFIBRILLATOR PLACEMENT    . CHOLECYSTECTOMY    . COLONOSCOPY N/A 07/22/2016   Procedure: COLONOSCOPY;  Surgeon: Mauri Pole, MD;  Location: [facility] ENDOSCOPY;  Service: Endoscopy;  Laterality: N/A;  . ESOPHAGOGASTRODUODENOSCOPY N/A 07/19/2016   Procedure: ESOPHAGOGASTRODUODENOSCOPY (EGD);  Surgeon: Milus Banister, MD;  Location: [place];  Service: Endoscopy;  Laterality: N/A;  .  kidney stent Right 2018  . right leg ankle surgery      Home Medications:  Prescriptions Prior to Admission  Medication Sig Dispense Refill Last Dose  . albuterol (PROVENTIL HFA;VENTOLIN HFA) 108 (90 Base) MCG/ACT inhaler Inhale 2 puffs into the lungs every 6 (six) hours as needed for wheezing or shortness of breath.   Past Week at Unknown time  . amLODipine (NORVASC) 10 MG tablet Take 10 mg by mouth daily.   09/02/2016  . carvedilol (COREG) 25 MG tablet Take 25 mg by mouth 2 (two) times daily with a meal.   09/02/2016  . cholecalciferol (VITAMIN D) 1000 units tablet Take 2,000 Units by mouth daily.   09/02/2016  . clopidogrel (PLAVIX) 75 MG tablet Take 1 tablet (75 mg total) by mouth daily. 30 tablet 0 09/02/2016  . furosemide (LASIX) 20 MG tablet Take 20 mg by mouth daily.   09/02/2016  . gabapentin (NEURONTIN) 300 MG capsule Take 1 capsule (300 mg total) by mouth at bedtime. (Patient taking differently: Take 600 mg by mouth 3 (three) times daily. ) 30 capsule 4 09/02/2016  . insulin glargine (LANTUS) 100 UNIT/ML injection Inject 0.3 mLs (30 Units total) into the skin at bedtime. 10 mL 11 09/02/2016  . levothyroxine (SYNTHROID, LEVOTHROID) 88 MCG tablet Take 88 mcg by mouth daily before breakfast.   09/02/2016  . lisinopril (PRINIVIL,ZESTRIL) 40 MG tablet Take 40 mg by mouth daily.   09/02/2016  . terazosin (HYTRIN) 2 MG  capsule Take 1 mg by mouth at bedtime.    07/16/2016 at Unknown time   Allergies:  Allergies  Allergen Reactions  . Grapefruit Extract Other (See Comments)    [facility] told him not to eat  . Amoxicillin Other (See Comments)    Unknown reaction - can tolerate Cephalosporins Has patient had a PCN reaction causing immediate rash, facial/tongue/throat swelling, SOB or lightheadedness with hypotension: unknown Has patient had a PCN reaction causing severe rash involving mucus membranes or skin necrosis: unknown Has patient had a PCN reaction that required hospitalization: unknown Has  patient had a PCN reaction occurring within the last 10 years: unknown If all of the above answers are "NO", then may proceed with Cephalosporin    Family History  Problem Relation Age of Onset  . Cancer Mother     colon cancer   Social History:  reports that he has never smoked. He has never used smokeless tobacco. He reports that he uses drugs, including Marijuana. He reports that he does not drink alcohol.  ROS: A complete review of systems was performed.  All systems are negative except for pertinent findings as noted. Review of Systems  All other systems reviewed and are negative.    Physical Exam:  Vital signs in last 24 hours: Temp:  [97.7 F (36.5 C)-102.7 F (39.3 C)] 102 F (38.9 C) (03/28 1722) Pulse Rate:  [98-129] 98 (03/28 1815) Resp:  [17-28] 17 (03/28 1830) BP: (120-159)/(71-93) 120/71 (03/28 1830) SpO2:  [94 %-100 %] 100 % (03/28 1815) Weight:  [72.6 kg (160 lb)] 72.6 kg (160 lb) (03/28 1549) General:  Alert and oriented, No acute distress but ill-appearing  HEENT: Normocephalic, atraumatic Neck: No JVD or lymphadenopathy Cardiovascular: Regular rate and rhythm Lungs: Regular rate and effort Abdomen: Soft, nontender, nondistended, no abdominal masses Back: No CVA tenderness Extremities: No edema Neurologic: Grossly intact  Laboratory Data:  Results for orders placed or performed during the hospital encounter of 09/04/16 (from the past 24 hour(s))  CBC with Differential/Platelet     Status: Abnormal   Collection Time: 09/04/16  2:35 PM  Result Value Ref Range   WBC 28.8 (H) 4.0 - 10.5 K/uL   RBC 4.32 4.22 - 5.81 MIL/uL   Hemoglobin 10.8 (L) 13.0 - 17.0 g/dL   HCT 32.0 (L) 39.0 - 52.0 %   MCV 74.1 (L) 78.0 - 100.0 fL   MCH 25.0 (L) 26.0 - 34.0 pg   MCHC 33.8 30.0 - 36.0 g/dL   RDW 16.9 (H) 11.5 - 15.5 %   Platelets 115 (L) 150 - 400 K/uL   Neutrophils Relative % 93 %   Neutro Abs 26.6 (H) 1.7 - 7.7 K/uL   Lymphocytes Relative 2 %   Lymphs Abs 0.5  (L) 0.7 - 4.0 K/uL   Monocytes Relative 5 %   Monocytes Absolute 1.6 (H) 0.1 - 1.0 K/uL   Eosinophils Relative 0 %   Eosinophils Absolute 0.0 0.0 - 0.7 K/uL   Basophils Relative 0 %   Basophils Absolute 0.0 0.0 - 0.1 K/uL  Comprehensive metabolic panel     Status: Abnormal   Collection Time: 09/04/16  2:35 PM  Result Value Ref Range   Sodium 131 (L) 135 - 145 mmol/L   Potassium 4.2 3.5 - 5.1 mmol/L   Chloride 99 (L) 101 - 111 mmol/L   CO2 18 (L) 22 - 32 mmol/L   Glucose, Bld 278 (H) 65 - 99 mg/dL   BUN 51 (H) 6 -  20 mg/dL   Creatinine, Ser 2.66 (H) 0.61 - 1.24 mg/dL   Calcium 9.2 8.9 - 10.3 mg/dL   Total Protein 9.2 (H) 6.5 - 8.1 g/dL   Albumin 3.5 3.5 - 5.0 g/dL   AST 29 15 - 41 U/L   ALT 19 17 - 63 U/L   Alkaline Phosphatase 64 38 - 126 U/L   Total Bilirubin 2.3 (H) 0.3 - 1.2 mg/dL   GFR calc non Af Amer 23 (L) >60 mL/min   GFR calc Af Amer 27 (L) >60 mL/min   Anion gap 14 5 - 15  Lipase, blood     Status: None   Collection Time: 09/04/16  2:35 PM  Result Value Ref Range   Lipase 24 11 - 51 U/L  Protime-INR     Status: Abnormal   Collection Time: 09/04/16  2:35 PM  Result Value Ref Range   Prothrombin Time 19.9 (H) 11.4 - 15.2 seconds   INR 1.67   Ammonia     Status: Abnormal   Collection Time: 09/04/16  2:35 PM  Result Value Ref Range   Ammonia <9 (L) 9 - 35 umol/L  I-Stat CG4 Lactic Acid, ED     Status: Abnormal   Collection Time: 09/04/16  2:45 PM  Result Value Ref Range   Lactic Acid, Venous 3.73 (HH) 0.5 - 1.9 mmol/L   Comment NOTIFIED PHYSICIAN   CBG monitoring, ED     Status: Abnormal   Collection Time: 09/04/16  3:32 PM  Result Value Ref Range   Glucose-Capillary 276 (H) 65 - 99 mg/dL  I-Stat CG4 Lactic Acid, ED     Status: Abnormal   Collection Time: 09/04/16  5:34 PM  Result Value Ref Range   Lactic Acid, Venous 2.89 (HH) 0.5 - 1.9 mmol/L   Comment NOTIFIED PHYSICIAN   Urinalysis, Routine w reflex microscopic- may I&O cath if menses     Status:  Abnormal   Collection Time: 09/04/16  6:10 PM  Result Value Ref Range   Color, Urine YELLOW YELLOW   APPearance HAZY (A) CLEAR   Specific Gravity, Urine 1.017 1.005 - 1.030   pH 6.0 5.0 - 8.0   Glucose, UA 150 (A) NEGATIVE mg/dL   Hgb urine dipstick LARGE (A) NEGATIVE   Bilirubin Urine NEGATIVE NEGATIVE   Ketones, ur 5 (A) NEGATIVE mg/dL   Protein, ur 100 (A) NEGATIVE mg/dL   Nitrite NEGATIVE NEGATIVE   Leukocytes, UA LARGE (A) NEGATIVE   RBC / HPF TOO NUMEROUS TO COUNT 0 - 5 RBC/hpf   WBC, UA TOO NUMEROUS TO COUNT 0 - 5 WBC/hpf   Bacteria, UA MANY (A) NONE SEEN   Squamous Epithelial / LPF 0-5 (A) NONE SEEN   Mucous PRESENT   Lactic acid, plasma     Status: Abnormal   Collection Time: 09/04/16  7:01 PM  Result Value Ref Range   Lactic Acid, Venous 2.8 (HH) 0.5 - 1.9 mmol/L   No results found for this or any previous visit (from the past 240 hour(s)). Creatinine:  Recent Labs  09/04/16 1435  CREATININE 2.66*    Impression/Assessment/plan: 1) Right renal stone, right proximal ureteral stone s/p recent stent at [facility] - sepsis possible UTI also with possible colon abscess.  -IVF and abx per primary - blood and urine cx pending -stent in good position and recently placed - no need to change  -needs foley catheter to max drain the system (reflux is normal with a stent). I placed an  order and discussed with his nurse. Can d/c foley when pt stable, UOP no longer needed, etc.    2) AKI - likely from sepsis. Baseline appears to be about 1.5. No hydro.    Meisha Salone 09/04/2016, 8:00 PM

## 2016-09-04 NOTE — ED Triage Notes (Signed)
Per EMS, pt from home complains of blood in urine and flank pain since this morning. Pt has hx of pneumonia and kidney stones. Pain is 8/10.  BP 120/70 HR 128 SpO2 99% CBG 202

## 2016-09-04 NOTE — ED Notes (Signed)
Attempted two IV's with no success. Called for assistance from other department nurses.

## 2016-09-04 NOTE — ED Notes (Signed)
MD made aware of I-STAT values

## 2016-09-04 NOTE — Consult Note (Addendum)
[facility]  [address] [place], [place] [address] Phone: [phone] FAX: [phone]     Anthony Mcfarland  12/22/1950 [id number]  CARE TEAM:  PCP: Ronnie Doss, MD  Outpatient Care Team: Patient Care Team: Ronnie Doss, MD as PCP - General (Internal Medicine)  Inpatient Treatment Team: Treatment Team: Attending Provider: Kerney Elbe, DO; Registered Nurse: Glennie Isle, RN; Technician: Carmelina Dane, EMT; Registered Nurse: Barnetta Chapel, RN; Consulting Physician: Nolon Nations, MD   This patient is a 66 y.o.male who presents today for surgical evaluation at the request of Dr Willaim Rayas ED MD.   Reason for evaluation: Abdominal pain with right retrocolic possible collection/abscess  66 year old male with numerous health problems including atrial flutter with ablation and cardioversion.  Chronically anticoagulated on Plavix.  Insulin requiring diabetes.  Liver cirrhosis, anemia, chronic renal insufficiency.  Had an outside institution he had right ureteral stent placement.  Sounds like he gets most of his treatment through the [facility], especially in [place], [place].    Had episode of Diarrhea and rectal bleeding.  Upper endoscopy under welling.  Underwent colonoscopy and had some changes on the proximal right colon concerning.  Biopsies came back consistent with no colitis nor inflammation.  Normal.  Plavix had been held with the time but I believe it has been restarted since.  He cannot tell me when he last took it.  However patient feels much more intense abdominal pain especially right sided and crampiness.  Started to have diarrhea.  Very dirty urine.  Hematuria.  Acting septic.  Being admitted to the stepdown unit. IV fluids.  IV antibiotics.  An renal failure.  Surgical and urology and medicine consultations requested.  He is complaining of a lot of gassiness and bloating and diarrhea.  Some abdominal pain but  nothing too severe.  ICU nurses just outside room.   Assessment  Anthony Mcfarland  66 y.o. male       Problem List:  Active Problems:   Sepsis ([place])   Intra-abdominal fluid collection - R flank ? abscess   Hematuria with dirty urinalysis with indwelling stent for uncertain reason.  Concerning for significant urosepsis.  Loose stools with right posterior colic swelling.  Prepped could be ischemic colitis with perforation and abscess since there was no evidence of any mucosal colitis on endoscopy last month.  Plan:  Agree with more intense monitoring and stepdown/ICU.  IV fluids.  Agree with broad IV antibiotics for now.  Try to get records from the [place] to figure out what has been done more recently.    GI consultation should he have worsening hematochezia/diarrhea.  Consider Cdiff check  Don't know if he truly has a discrete fluid collection/abscess as it is a completely noncontrasted CT.  Even if he did, would like to medically stabilize him.  Help his renal failure resolve.  Allow time for anticoagulation to wear off.  Consider repeat CT scanning in about 4-5 days.  See if the concerning area in the right retrocolic region behind ascending colon is improved, resolved, or worse.  If isolated abscess, recommend percutaneous drainage.  Try and hold off on any emergent surgery such as a colectomy.  With Diabetes and renal failure and other numerous comorbidities, would be higher risk for anastomosis unless he markedly improved clinically.  The patient is guarded but there is no evidence of peritonitis, acute abdomen.  There is no strong evidence of failure of  improvement nor decline with current non-operative management.  There is no need for surgery at the present moment.  We will continue to follow.   -VTE prophylaxis- SCDs, etc -mobilize as tolerated to help recovery    Adin Hector, M.D., F.A.C.S. Gastrointestinal and Minimally Invasive Surgery [facility] [address] [place], [place] [address] [phone] Main / Paging   09/04/2016      Past Medical History:  Diagnosis Date  . Anemia in chronic illness 04/13/2014  . Atrial flutter [facility])    s/p ablation 03/2016 and 06/2016 at [facility].    Marland Kitchen Complication of anesthesia    takes awhile to wake up due to kidney and liver functions  . Coronary artery disease   . Diabetes mellitus without complication ([place])   . Hepatitis C    This was treated in late 2015 with anti-viral medications overseen at [facility].    Past Surgical History:  Procedure Laterality Date  . CARDIAC DEFIBRILLATOR PLACEMENT    . CHOLECYSTECTOMY    . COLONOSCOPY N/A 07/22/2016   Procedure: COLONOSCOPY;  Surgeon: Mauri Pole, MD;  Location: [facility] ENDOSCOPY;  Service: Endoscopy;  Laterality: N/A;  . ESOPHAGOGASTRODUODENOSCOPY N/A 07/19/2016   Procedure: ESOPHAGOGASTRODUODENOSCOPY (EGD);  Surgeon: Milus Banister, MD;  Location: [facility];  Service: Endoscopy;  Laterality: N/A;  . kidney stent Right 2018  . right leg ankle surgery      Social History   Social History  . Marital status: Single    Spouse name: N/A  . Number of children: N/A  . Years of education: N/A   Occupational History  . Not on file.   Social History Main Topics  . Smoking status: Never Smoker  . Smokeless tobacco: Never Used  . Alcohol use No  . Drug use: Yes    Types: Marijuana  . Sexual activity: Not on file   Other Topics Concern  . Not on file   Social History Narrative  . No narrative on file    Family History  Problem Relation Age of Onset  . Cancer Mother     colon cancer    Current Facility-Administered Medications  Medication Dose Route Frequency Provider Last Rate Last Dose  . aztreonam (AZACTAM) 1 GM IVPB  1 g Intravenous Q8H Baltazar Najjar [place], RPH      . levofloxacin (LEVAQUIN) IVPB 750 mg  750 mg Intravenous Once Julianne Rice, MD 100 mL/hr at 09/04/16 1719 750 mg at 09/04/16  1719  . [START ON 09/06/2016] levofloxacin (LEVAQUIN) IVPB 750 mg  750 mg Intravenous Q48H Thomes Lolling, RPH      . vancomycin (VANCOCIN) IVPB 1000 mg/200 mL premix  1,000 mg Intravenous Once Julianne Rice, MD      . Derrill Memo ON 09/05/2016] vancomycin (VANCOCIN) IVPB 750 mg/150 ml premix  750 mg Intravenous Q24H Thomes Lolling, [facility]       Current Outpatient Prescriptions  Medication Sig Dispense Refill  . albuterol (PROVENTIL HFA;VENTOLIN HFA) 108 (90 Base) MCG/ACT inhaler Inhale 2 puffs into the lungs every 6 (six) hours as needed for wheezing or shortness of breath.    Marland Kitchen amLODipine (NORVASC) 10 MG tablet Take 10 mg by mouth daily.    . carvedilol (COREG) 25 MG tablet Take 25 mg by mouth 2 (two) times daily with a meal.    . cholecalciferol (VITAMIN D) 1000 units tablet Take 2,000 Units by mouth daily.    . clopidogrel (PLAVIX) 75 MG  tablet Take 1 tablet (75 mg total) by mouth daily. 30 tablet 0  . furosemide (LASIX) 20 MG tablet Take 20 mg by mouth daily.    Marland Kitchen gabapentin (NEURONTIN) 300 MG capsule Take 1 capsule (300 mg total) by mouth at bedtime. (Patient taking differently: Take 600 mg by mouth 3 (three) times daily. ) 30 capsule 4  . insulin glargine (LANTUS) 100 UNIT/ML injection Inject 0.3 mLs (30 Units total) into the skin at bedtime. 10 mL 11  . levothyroxine (SYNTHROID, LEVOTHROID) 88 MCG tablet Take 88 mcg by mouth daily before breakfast.    . lisinopril (PRINIVIL,ZESTRIL) 40 MG tablet Take 40 mg by mouth daily.    Marland Kitchen terazosin (HYTRIN) 2 MG capsule Take 1 mg by mouth at bedtime.        Allergies  Allergen Reactions  . Grapefruit Extract Other (See Comments)    [facility] doctor told him not to eat  . Amoxicillin Other (See Comments)    Unknown reaction  Has patient had a PCN reaction causing immediate rash, facial/tongue/throat swelling, SOB or lightheadedness with hypotension: unknown Has patient had a PCN reaction causing severe rash involving mucus membranes or skin necrosis:  unknown Has patient had a PCN reaction that required hospitalization: unknown Has patient had a PCN reaction occurring within the last 10 years: unknown If all of the above answers are "NO", then may proceed with Cephalosporin use.     ROS:   All other systems reviewed & are negative except per HPI or as noted below: Constitutional:  No fevers, ++ chills, ?+ sweats.  Weight stable Eyes:  No vision changes, No discharge HENT:  No sore throats, nasal drainage Lymph: No neck swelling, No bruising easily Pulmonary:  No cough, productive sputum CV: No orthopnea, PND  Patient walks 10 minutes No exertional chest/neck/shoulder/arm pain. GI: No personal nor family history of GI/colon cancer, inflammatory bowel disease, irritable bowel syndrome, allergy such as Celiac Sprue, dietary/dairy problems, colitis, ulcers nor gastritis.  No recent sick contacts/gastroenteritis.  No travel outside the country.  No changes in diet. Renal: ++ UTIs, ++ hematuria Genital:  No drainage, bleeding, masses Musculoskeletal: No severe joint pain.  Good ROM major joints Skin:  No sores or lesions.  No rashes Heme/Lymph:  Some easy bleeding & bruising on Plavix.  No swollen lymph nodes Neuro: No focal weakness/numbness.  No seizures Psych: No suicidal ideation.  No hallucinations  BP (!) 150/93 (BP Location: Left Arm)   Pulse (!) 107   Temp (!) 102 F (38.9 C) (Rectal)   Resp (!) 24   Ht 6\' 1"  (1.854 m)   Wt 72.6 kg (160 lb)   SpO2 97%   BMI 21.11 kg/m   Physical Exam: General: Pt awake/alert/oriented x4 in moderate acute distress Eyes: PERRL, normal EOM. Sclera nonicteric Neuro: CN II-XII intact w/o focal sensory/motor deficits. Lymph: No head/neck/groin lymphadenopathy Psych:  No delerium/psychosis/paranoia.  Anxious but somewhat consolable. HENT: Normocephalic, Mucus membranes moist.  No thrush Neck: Supple, No tracheal deviation Chest: No pain.  Good respiratory excursion. CV:  Irreg.  HR 100-100.   Pulses intact.   Abdomen: Soft, Mildly distended.  Mild tenderness to palpation but diffuse and vague.  No guarding.  No reproduction of pain with cough or bed shake or percussion.  No evidence of peritonitis.  Having flatus and diarrhea.  No umbilical hernia.  No diastases recti. No incarcerated hernias. Gen:  No inguinal hernias.  No inguinal lymphadenopathy.   Ext:  SCDs BLE.  No  significant edema.  No cyanosis Skin: No petechiae / purpurea.  No major sores Musculoskeletal: No severe joint pain.  Good ROM major joints   Results:   Labs: Results for orders placed or performed during the hospital encounter of 09/04/16 (from the past 48 hour(s))  CBC with Differential/Platelet     Status: Abnormal   Collection Time: 09/04/16  2:35 PM  Result Value Ref Range   WBC 28.8 (H) 4.0 - 10.5 K/uL   RBC 4.32 4.22 - 5.81 MIL/uL   Hemoglobin 10.8 (L) 13.0 - 17.0 g/dL   HCT 82.2 (L) 91.4 - 51.3 %   MCV 74.1 (L) 78.0 - 100.0 fL   MCH 25.0 (L) 26.0 - 34.0 pg   MCHC 33.8 30.0 - 36.0 g/dL   RDW 19.6 (H) 83.3 - 89.5 %   Platelets 115 (L) 150 - 400 K/uL    Comment: SPECIMEN CHECKED FOR CLOTS REPEATED TO VERIFY PLATELET COUNT CONFIRMED BY SMEAR    Neutrophils Relative % 93 %   Neutro Abs 26.6 (H) 1.7 - 7.7 K/uL   Lymphocytes Relative 2 %   Lymphs Abs 0.5 (L) 0.7 - 4.0 K/uL   Monocytes Relative 5 %   Monocytes Absolute 1.6 (H) 0.1 - 1.0 K/uL   Eosinophils Relative 0 %   Eosinophils Absolute 0.0 0.0 - 0.7 K/uL   Basophils Relative 0 %   Basophils Absolute 0.0 0.0 - 0.1 K/uL  Comprehensive metabolic panel     Status: Abnormal   Collection Time: 09/04/16  2:35 PM  Result Value Ref Range   Sodium 131 (L) 135 - 145 mmol/L   Potassium 4.2 3.5 - 5.1 mmol/L   Chloride 99 (L) 101 - 111 mmol/L   CO2 18 (L) 22 - 32 mmol/L   Glucose, Bld 278 (H) 65 - 99 mg/dL   BUN 51 (H) 6 - 20 mg/dL   Creatinine, Ser 1.23 (H) 0.61 - 1.24 mg/dL   Calcium 9.2 8.9 - 22.6 mg/dL   Total Protein 9.2 (H) 6.5 - 8.1 g/dL    Albumin 3.5 3.5 - 5.0 g/dL   AST 29 15 - 41 U/L   ALT 19 17 - 63 U/L   Alkaline Phosphatase 64 38 - 126 U/L   Total Bilirubin 2.3 (H) 0.3 - 1.2 mg/dL   GFR calc non Af Amer 23 (L) >60 mL/min   GFR calc Af Amer 27 (L) >60 mL/min    Comment: (NOTE) The eGFR has been calculated using the CKD EPI equation. This calculation has not been validated in all clinical situations. eGFR's persistently <60 mL/min signify possible Chronic Kidney Disease.    Anion gap 14 5 - 15  Lipase, blood     Status: None   Collection Time: 09/04/16  2:35 PM  Result Value Ref Range   Lipase 24 11 - 51 U/L  Protime-INR     Status: Abnormal   Collection Time: 09/04/16  2:35 PM  Result Value Ref Range   Prothrombin Time 19.9 (H) 11.4 - 15.2 seconds   INR 1.67   Ammonia     Status: Abnormal   Collection Time: 09/04/16  2:35 PM  Result Value Ref Range   Ammonia <9 (L) 9 - 35 umol/L  I-Stat CG4 Lactic Acid, ED     Status: Abnormal   Collection Time: 09/04/16  2:45 PM  Result Value Ref Range   Lactic Acid, Venous 3.73 (HH) 0.5 - 1.9 mmol/L   Comment NOTIFIED PHYSICIAN   CBG monitoring, ED  Status: Abnormal   Collection Time: 09/04/16  3:32 PM  Result Value Ref Range   Glucose-Capillary 276 (H) 65 - 99 mg/dL  I-Stat CG4 Lactic Acid, ED     Status: Abnormal   Collection Time: 09/04/16  5:34 PM  Result Value Ref Range   Lactic Acid, Venous 2.89 (HH) 0.5 - 1.9 mmol/L   Comment NOTIFIED PHYSICIAN     Imaging / Studies: Ct Abdomen Pelvis Wo Contrast  Result Date: 09/04/2016 CLINICAL DATA:  Hematuria and flank pain beginning this morning. History of pneumonia, kidney stones, diabetes, hepatitis-C. EXAM: CT ABDOMEN AND PELVIS WITHOUT CONTRAST TECHNIQUE: Multidetector CT imaging of the abdomen and pelvis was performed following the standard protocol without IV contrast. COMPARISON:  Abdominal ultrasound July 18, 2016 FINDINGS: Respiratory motion degraded examination LOWER CHEST: Lung bases are clear. Included  heart is moderately enlarged, GI CT incompletely assessed. No pericardial effusion. HEPATOBILIARY: Status post cholecystectomy. Mildly nodular liver contour. PANCREAS: Normal. SPLEEN: Normal. ADRENALS/URINARY TRACT: Kidneys are orthotopic, demonstrating normal size and morphology. Double-pigtail nephroureteral stent with well-positioned retaining loops in RIGHT renal pelvis and bladder. 3 mm RIGHT lower pole nephrolithiasis. Limited assessment for renal masses on this nonenhanced examination. The unopacified ureters are normal in course and caliber. Urinary bladder is partially distended and unremarkable. Normal adrenal glands. STOMACH/BOWEL: The stomach, small and large bowel are normal in course and caliber, sensitivity decreased by lack of enteric contrast. Focal fluid adjacent versus within RIGHT hepatic flexure with suspected bowel wall thickening. VASCULAR/LYMPHATIC: Aortoiliac vessels are normal in course and caliber, mild calcific atherosclerosis. No lymphadenopathy by CT size criteria though limited sensitivity due to motion and noncontrast examination. REPRODUCTIVE: Mild prostatomegaly. OTHER: Trace free fluid in the pelvis and RIGHT pericolic gutter. MUSCULOSKELETAL: Non-acute. Small fat containing inguinal hernias. Osteopenia. Moderate degenerative change of the hips. IMPRESSION: 3 mm nonobstructing RIGHT nephrolithiasis. Well-positioned RIGHT nephroureteral stent. Focal fluid within the RIGHT upper quadrant likely extraluminal adjacent to RIGHT splenic flexure, bowel pathology possible abscess not confirmed by noncontrast respiratory motion degraded examination. Recommend follow-up CT with enteric contrast when patient able to remain still (and consider intravenous contrast within patient's renal status improves). Recommend colonoscopy after resolution of acute symptoms. Cirrhosis, status post cholecystectomy. Electronically Signed   By: Elon Alas M.D.   On: 09/04/2016 17:25   Dg Chest Port 1  View  Result Date: 09/04/2016 CLINICAL DATA:  Hematuria and flank pain since this morning. EXAM: PORTABLE CHEST 1 VIEW COMPARISON:  07/17/2016 FINDINGS: Artifact overlies chest. Pacemaker/AICD in place. Chronic left ventricular prominence. Chronic aortic atherosclerosis. The patient has taken a poor inspiration. There is chronic scarring on the left related to previous surgery. No sign of active infiltrate, collapse or effusion. IMPRESSION: Poor inspiration. Chronic left ventricular prominence. Pacemaker/ AICD. Chronic pulmonary scarring. No active process evident. Electronically Signed   By: Nelson Chimes M.D.   On: 09/04/2016 15:59    Medications / Allergies: per chart  Antibiotics: Anti-infectives    Start     Dose/Rate Route Frequency Ordered Stop   09/06/16 1800  levofloxacin (LEVAQUIN) IVPB 750 mg     750 mg 100 mL/hr over 90 Minutes Intravenous Every 48 hours 09/04/16 1654     09/05/16 2000  vancomycin (VANCOCIN) IVPB 750 mg/150 ml premix     750 mg 150 mL/hr over 60 Minutes Intravenous Every 24 hours 09/04/16 1654     09/04/16 2359  aztreonam (AZACTAM) 1 GM IVPB     1 g 100 mL/hr over 30 Minutes Intravenous Every  8 hours 09/04/16 1654     09/04/16 1545  levofloxacin (LEVAQUIN) IVPB 750 mg     750 mg 100 mL/hr over 90 Minutes Intravenous  Once 09/04/16 1542     09/04/16 1545  aztreonam (AZACTAM) 2 g in dextrose 5 % 50 mL IVPB     2 g 100 mL/hr over 30 Minutes Intravenous  Once 09/04/16 1542 09/04/16 1719   09/04/16 1545  vancomycin (VANCOCIN) IVPB 1000 mg/200 mL premix     1,000 mg 200 mL/hr over 60 Minutes Intravenous  Once 09/04/16 1542          Note: Portions of this report may have been transcribed using voice recognition software. Every effort was made to ensure accuracy; however, inadvertent computerized transcription errors may be present.   Any transcriptional errors that result from this process are unintentional.    Adin Hector, M.D.,  F.A.C.S. Gastrointestinal and Minimally Invasive Surgery [facility] [address] [place], [place] [address] [phone] Main / Paging   09/04/2016

## 2016-09-04 NOTE — ED Notes (Signed)
Will give bedside report.  

## 2016-09-05 ENCOUNTER — Inpatient Hospital Stay (HOSPITAL_COMMUNITY): Payer: Medicare Other

## 2016-09-05 DIAGNOSIS — R109 Unspecified abdominal pain: Secondary | ICD-10-CM

## 2016-09-05 DIAGNOSIS — R7881 Bacteremia: Secondary | ICD-10-CM

## 2016-09-05 DIAGNOSIS — R319 Hematuria, unspecified: Secondary | ICD-10-CM

## 2016-09-05 DIAGNOSIS — E119 Type 2 diabetes mellitus without complications: Secondary | ICD-10-CM

## 2016-09-05 DIAGNOSIS — N200 Calculus of kidney: Secondary | ICD-10-CM

## 2016-09-05 DIAGNOSIS — A4159 Other Gram-negative sepsis: Principal | ICD-10-CM

## 2016-09-05 DIAGNOSIS — Z794 Long term (current) use of insulin: Secondary | ICD-10-CM

## 2016-09-05 DIAGNOSIS — A0472 Enterocolitis due to Clostridium difficile, not specified as recurrent: Secondary | ICD-10-CM

## 2016-09-05 DIAGNOSIS — R935 Abnormal findings on diagnostic imaging of other abdominal regions, including retroperitoneum: Secondary | ICD-10-CM

## 2016-09-05 LAB — BLOOD CULTURE ID PANEL (REFLEXED)
ACINETOBACTER BAUMANNII: NOT DETECTED
CANDIDA ALBICANS: NOT DETECTED
CANDIDA TROPICALIS: NOT DETECTED
Candida glabrata: NOT DETECTED
Candida krusei: NOT DETECTED
Candida parapsilosis: NOT DETECTED
Carbapenem resistance: NOT DETECTED
ENTEROBACTER CLOACAE COMPLEX: NOT DETECTED
ENTEROCOCCUS SPECIES: NOT DETECTED
Enterobacteriaceae species: DETECTED — AB
Escherichia coli: NOT DETECTED
HAEMOPHILUS INFLUENZAE: NOT DETECTED
Klebsiella oxytoca: NOT DETECTED
Klebsiella pneumoniae: NOT DETECTED
Listeria monocytogenes: NOT DETECTED
NEISSERIA MENINGITIDIS: NOT DETECTED
PROTEUS SPECIES: DETECTED — AB
Pseudomonas aeruginosa: NOT DETECTED
STAPHYLOCOCCUS SPECIES: NOT DETECTED
STREPTOCOCCUS AGALACTIAE: NOT DETECTED
STREPTOCOCCUS SPECIES: NOT DETECTED
Serratia marcescens: NOT DETECTED
Staphylococcus aureus (BCID): NOT DETECTED
Streptococcus pneumoniae: NOT DETECTED
Streptococcus pyogenes: NOT DETECTED

## 2016-09-05 LAB — COMPREHENSIVE METABOLIC PANEL
ALT: 14 U/L — AB (ref 17–63)
AST: 17 U/L (ref 15–41)
Albumin: 2.6 g/dL — ABNORMAL LOW (ref 3.5–5.0)
Alkaline Phosphatase: 45 U/L (ref 38–126)
Anion gap: 6 (ref 5–15)
BILIRUBIN TOTAL: 1.4 mg/dL — AB (ref 0.3–1.2)
BUN: 36 mg/dL — AB (ref 6–20)
CO2: 18 mmol/L — ABNORMAL LOW (ref 22–32)
CREATININE: 1.86 mg/dL — AB (ref 0.61–1.24)
Calcium: 7.9 mg/dL — ABNORMAL LOW (ref 8.9–10.3)
Chloride: 108 mmol/L (ref 101–111)
GFR, EST AFRICAN AMERICAN: 42 mL/min — AB (ref >60)
GFR, EST NON AFRICAN AMERICAN: 36 mL/min — AB (ref >60)
Glucose, Bld: 170 mg/dL — ABNORMAL HIGH (ref 65–99)
Potassium: 4 mmol/L (ref 3.5–5.1)
Sodium: 132 mmol/L — ABNORMAL LOW (ref 135–145)
TOTAL PROTEIN: 7.2 g/dL (ref 6.5–8.1)

## 2016-09-05 LAB — CBC WITH DIFFERENTIAL/PLATELET
BASOS ABS: 0 10*3/uL (ref 0.0–0.1)
Basophils Relative: 0 %
EOS PCT: 0 %
Eosinophils Absolute: 0 10*3/uL (ref 0.0–0.7)
HEMATOCRIT: 26.6 % — AB (ref 39.0–52.0)
Hemoglobin: 9 g/dL — ABNORMAL LOW (ref 13.0–17.0)
LYMPHS ABS: 0.7 10*3/uL (ref 0.7–4.0)
Lymphocytes Relative: 5 %
MCH: 25.3 pg — AB (ref 26.0–34.0)
MCHC: 33.8 g/dL (ref 30.0–36.0)
MCV: 74.7 fL — AB (ref 78.0–100.0)
MONO ABS: 1.5 10*3/uL — AB (ref 0.1–1.0)
Monocytes Relative: 10 %
NEUTROS ABS: 12.7 10*3/uL — AB (ref 1.7–7.7)
Neutrophils Relative %: 85 %
PLATELETS: 92 10*3/uL — AB (ref 150–400)
RBC: 3.56 MIL/uL — ABNORMAL LOW (ref 4.22–5.81)
RDW: 17.2 % — AB (ref 11.5–15.5)
WBC: 14.9 10*3/uL — ABNORMAL HIGH (ref 4.0–10.5)

## 2016-09-05 LAB — GLUCOSE, CAPILLARY
GLUCOSE-CAPILLARY: 169 mg/dL — AB (ref 65–99)
Glucose-Capillary: 210 mg/dL — ABNORMAL HIGH (ref 65–99)
Glucose-Capillary: 158 mg/dL — ABNORMAL HIGH (ref 65–99)
Glucose-Capillary: 124 mg/dL — ABNORMAL HIGH (ref 65–99)
Glucose-Capillary: 135 mg/dL — ABNORMAL HIGH (ref 65–99)

## 2016-09-05 LAB — TROPONIN I
Troponin I: 0.25 ng/mL (ref <0.03)
Troponin I: 0.24 ng/mL

## 2016-09-05 LAB — C DIFFICILE QUICK SCREEN W PCR REFLEX
C DIFFICILE (CDIFF) TOXIN: POSITIVE — AB
C DIFFICLE (CDIFF) ANTIGEN: POSITIVE — AB
C Diff interpretation: DETECTED

## 2016-09-05 LAB — PROTIME-INR
INR: 1.6
Prothrombin Time: 19.2 seconds — ABNORMAL HIGH (ref 11.4–15.2)

## 2016-09-05 LAB — LACTIC ACID, PLASMA: LACTIC ACID, VENOUS: 1.1 mmol/L (ref 0.5–1.9)

## 2016-09-05 LAB — MAGNESIUM: Magnesium: 1.6 mg/dL — ABNORMAL LOW (ref 1.7–2.4)

## 2016-09-05 LAB — T4, FREE: Free T4: 1.18 ng/dL — ABNORMAL HIGH (ref 0.61–1.12)

## 2016-09-05 LAB — PHOSPHORUS: PHOSPHORUS: 1.8 mg/dL — AB (ref 2.5–4.6)

## 2016-09-05 LAB — APTT: aPTT: 33 s (ref 24–36)

## 2016-09-05 LAB — TSH: TSH: 0.635 u[IU]/mL (ref 0.350–4.500)

## 2016-09-05 MED ORDER — VANCOMYCIN HCL IN DEXTROSE 1-5 GM/200ML-% IV SOLN: 1000.0000 mg | INTRAVENOUS | Status: DC

## 2016-09-05 MED ORDER — MAGNESIUM SULFATE 2 GM/50ML IV SOLN
2.0000 g | Freq: Once | INTRAVENOUS | Status: AC
  Administered 2016-09-05: 2 g via INTRAVENOUS
  Filled 2016-09-05: qty 50

## 2016-09-05 MED ORDER — ADULT MULTIVITAMIN W/MINERALS CH
1.0000 | ORAL_TABLET | Freq: Every day | ORAL | Status: DC
  Administered 2016-09-05 – 2016-09-08 (×4): 1 via ORAL
  Filled 2016-09-05 (×4): qty 1

## 2016-09-05 MED ORDER — CEFTRIAXONE SODIUM 2 G IJ SOLR
2.0000 g | INTRAMUSCULAR | Status: DC
  Administered 2016-09-05 – 2016-09-07 (×3): 2 g via INTRAVENOUS
  Filled 2016-09-05 (×4): qty 2

## 2016-09-05 MED ORDER — VANCOMYCIN 50 MG/ML ORAL SOLUTION
125.0000 mg | Freq: Four times a day (QID) | ORAL | Status: DC
Start: 2016-09-05 — End: 2016-09-08
  Administered 2016-09-05 – 2016-09-08 (×15): 125 mg via ORAL
  Filled 2016-09-05 (×15): qty 2.5

## 2016-09-05 MED ORDER — IOPAMIDOL (ISOVUE-300) INJECTION 61%
INTRAVENOUS | Status: AC
  Filled 2016-09-05: qty 30

## 2016-09-05 MED ORDER — DEXTROSE 5 % IV SOLN
2.0000 g | INTRAVENOUS | Status: DC
  Filled 2016-09-05: qty 2

## 2016-09-05 MED ORDER — IOPAMIDOL (ISOVUE-300) INJECTION 61%: 30.0000 mL | Freq: Once | INTRAVENOUS | Status: DC | PRN

## 2016-09-05 NOTE — Progress Notes (Addendum)
PHARMACY - PHYSICIAN COMMUNICATION CRITICAL VALUE ALERT - BLOOD CULTURE IDENTIFICATION (BCID)  Results for orders placed or performed during the hospital encounter of 09/04/16  Blood Culture ID Panel (Reflexed) (Collected: 09/04/2016  4:03 PM)  Result Value Ref Range   Enterococcus species NOT DETECTED NOT DETECTED   Listeria monocytogenes NOT DETECTED NOT DETECTED   Staphylococcus species NOT DETECTED NOT DETECTED   Staphylococcus aureus NOT DETECTED NOT DETECTED   Streptococcus species NOT DETECTED NOT DETECTED   Streptococcus agalactiae NOT DETECTED NOT DETECTED   Streptococcus pneumoniae NOT DETECTED NOT DETECTED   Streptococcus pyogenes NOT DETECTED NOT DETECTED   Acinetobacter baumannii NOT DETECTED NOT DETECTED   Enterobacteriaceae species DETECTED (A) NOT DETECTED   Enterobacter cloacae complex NOT DETECTED NOT DETECTED   Escherichia coli NOT DETECTED NOT DETECTED   Klebsiella oxytoca NOT DETECTED NOT DETECTED   Klebsiella pneumoniae NOT DETECTED NOT DETECTED   Proteus species DETECTED (A) NOT DETECTED   Serratia marcescens NOT DETECTED NOT DETECTED   Carbapenem resistance NOT DETECTED NOT DETECTED   Haemophilus influenzae NOT DETECTED NOT DETECTED   Neisseria meningitidis NOT DETECTED NOT DETECTED   Pseudomonas aeruginosa NOT DETECTED NOT DETECTED   Candida albicans NOT DETECTED NOT DETECTED   Candida glabrata NOT DETECTED NOT DETECTED   Candida krusei NOT DETECTED NOT DETECTED   Candida parapsilosis NOT DETECTED NOT DETECTED   Candida tropicalis NOT DETECTED NOT DETECTED    Name of physician (or Provider) Contacted: Dr. Alfredia Ferguson  Changes to prescribed antibiotics required: change to ceftriaxone 2g IV q24h  Hershal Coria 09/05/2016  12:59 PM

## 2016-09-05 NOTE — Progress Notes (Signed)
Inpatient Diabetes Program Recommendations  AACE/ADA: New Consensus Statement on Inpatient Glycemic Control (2015)  Target Ranges:  Prepandial:   less than 140 mg/dL      Peak postprandial:   less than 180 mg/dL (1-2 hours)      Critically ill patients:  140 - 180 mg/dL   Lab Results  Component Value Date   GLUCAP 158 (H) 09/05/2016   HGBA1C 15.7 (H) 04/12/2014    Review of Glycemic Control  Diabetes history: DM2 Outpatient Diabetes medications: Lantus 30 units QHS Current orders for Inpatient glycemic control: Lantus 30 units QHS, Novolog 0-9 units tidwc HgbA1C pending  Spoke with pt about his diabetes care at home. States he sees his PCP at [place], who manages his DM, regularly. Takes Lantus 30 units QHS at home. States he checks blood sugars "a couple of times/day" and his sister keeps them recorded in a logbook. Pt seemed to be uncomfortable and was not interested in discussing his DM. Told pt his HgbA1C results are pending.   Inpatient Diabetes Program Recommendations:    Agree with orders. Will f/u when HgbA1C results are in. May need meal coverage insulin.  Thank you. Lorenda Peck, RD, LDN, CDE Inpatient Diabetes Coordinator [phone]

## 2016-09-05 NOTE — Care Management Note (Signed)
Case Management Note  Patient Details  Name: Anthony Mcfarland MRN: [id number] Date of Birth: Oct 05, 1950  Subjective/Objective:     Hematuria with uti               Action/Plan:Date:  September 05, 2016 Chart reviewed for concurrent status and case management needs. Will continue to follow patient progress. Discharge Planning: following for needs Expected discharge date: [id number] Velva Harman, BSN, [place], [place]   Expected Discharge Date:   (unknown)               Expected Discharge Plan:  Home/Self Care  In-House Referral:     Discharge planning Services     Post Acute Care Choice:    Choice offered to:     DME Arranged:    DME Agency:     HH Arranged:    [place] Agency:     Status of Service:  In process, will continue to follow  If discussed at Long Length of Stay Meetings, dates discussed:    Additional Comments:  Leeroy Cha, RN 09/05/2016, 9:41 AM

## 2016-09-05 NOTE — Progress Notes (Signed)
PROGRESS NOTE    Anthony Mcfarland  VOJ:[id number] DOB: 1951-01-18 DOA: 09/04/2016 PCP: Ronnie Doss, MD   Brief Narrative:  Anthony Mcfarland is a 66 y.o. male with medical history significant of Atrial Flutter s/p Ablation x2 and Cardioversion, CAD, Insulin Dependent Diabetes Mellitus x2, Hepatitis s/p Treatment with Harvoni, Liver Cirrhosis with Ascites, Anemia of Chronic Disease and other comorbid conditions who presented to [facility] with a cc of of Left Sided Abdominal Pain. Patient states he recently had a Right Ureteral Stent 2 weeks ago and subsequently developed sharp abdominal pain that was intermittent with no clear onset. Patient states it felt like a "knife" sometimes and states he had associated Nausea and bilious non-bloody Vomiting since yesterday. Of note patient was recently admitted for Pancolitis and Rectal Bleeding. Patient did have fevers and chills as well. Had no bloody stools but daughter became concerned and noticed that he had gross hematuria so she brought him to the ED for evaluation. [facility] admitted him for sepsis and was found to have a UTI as well as Proteus Bacteremia. I The suspected abscess was not seen on CT Abd/Pelvis with Contrast and there was a mild colitis noted because of mild thickening of the right colonic wall and small adjacent stranding periods. C Difficile was checked and was positive because patient was having significant diarrhea. He was improving and felt better today.   Assessment & Plan:   Principal Problem:   Sepsis secondary to UTI [facility]) Active Problems:   Hyperglycemia   Thrombocytopenia (HCC)   Diabetes mellitus type 2, insulin dependent ([place])   Coronary artery disease   Anemia   Cirrhosis (HCC)   Intra-abdominal fluid collection - R flank ? abscess   Bacteremia due to Gram-negative bacteria   Proteus septicemia (HCC)   C. difficile colitis   Hypomagnesemia  Sepsis 2/2 to Pyelonephritis/UTI, Proteus Bacteremia and C Difficile Colitis    -Patient was febrile at 102, Tachycardic at 129, RR of 26, WBC of 28.8 and suspected infection of UTI (though no Urinalysis or Urine Cx was drawn prior to initiation of Abx) because of recent Right Ureteral Stent -Sepsis physiology improving  -Sepsis Protocol Initiated and patient received 2.25 Liters in ED (30 cc/kg) -Unfortunately EDP gave patient Abx before obtaining Urinalysis or Urine Cx -Blood Cx's were also drawn after Abx were initiated; Blood Cx positive for Enterobacterciae Proteus Species -Lactic Acid Level was elevated at 3.73 and trending down to 2.89 and trended down to 1.1 after fluid boluses -Procalcitonin obtained and was 3.44 -CXR showed poor Inspiration. Chronic Left Ventricular Prominence with poor inspiration, Chronic Pulmonary Scarring, and a Pacemaker/AICD present with no active process  -CT Scan of Abd/Pelvis showed 3 mm Non-Obstructing Right Nephrolithiasis and well positioned Right Nephroureteral Stent as well as a Focal Fluid within the Right Upper Quadrant likely extraluminal adjacent to Right Splenic Flexure/Hepatic Flexure, bowel pathology and possible abscess -Repeat CT with po Contrast showed right ureteral stent in place and Right non-obstructive nephrolithiasis stable as well as a 7 mm calcified obstructive calculus in the proximal Right Ureter adjacent to the stent along with mild thickening of the Right Colonic Wall and small adjacent stranding periods highly suspicious for mild colitis.  -**Patient recently had colonoscopy 07/22/16 and showed a 7 mm polyp in the transverse colon and erythematous mucosa in the ascending colon in the cecum that was biopsied and there examination was otherwise normal -General Surgery Consulted by EDP and patient being seen by Dr. Johney Maine -Urology also consulted by EDP  and appreciated Recc's -Changed Broad Spectrum Abx of IV Cefepime and IV Vancomycin as Blood Cx grew out GNR Proteus (Sensitivities still pending) -C/w Maintenance Fluid  with NS at 100 mL/hr -WBC went from 28.8 -> 14.9 -PER Nurse patient was having significant amount of Watery Diarrhea; Checked for C. Difficile and patient was positive for C Difficile; Start Enteric Precautions -Will Start Oral Vancomycin -Urinalysis done finally and showed Many Bacteria, Large Blood with TNTC RBC, Large Leukocytes, Negative Nitrites, and TNTC WBC -Have requested Urine Cx and it is pending -Per Urology Recc's once Cx's are known and he transfers to po, would treat him BID x 14 days and then po Nightly and [facility].   AKI on suspected CKD Stage 3, Improving -Upon Review of Prior Records Patient's Baseline Cr appears to be 1.2-1.4 -Patient's BUN/Cr on Admission is 51/2.66 -> Now down to 36/1.86 -C/w IVF with NS at 100 mL/hr -Avoid Nephrotoxic Medications and will hold Furosemide 20 mg po Daily and Lisinopril 40 mg daily -Urology placed foley catheter  -Appreciate Urology Input as patient recently had Right Ureteral Stent and now having Hematuria -Repeat CMP in AM  Hematuria, improved -Patient's Urinalysis showed Large Hgb and TNTC RBC -UDS showed patient was positive for Opiates and Cocaine  -Since Patient recently had Right Ureteral Stent appreciate Urology Evaluation and Recc's -Continue to Monitor with Foley Catheter in place  Hx of Atrial Flutter s/p Ablation x2 and Cardioversion currently in Sinus Tachycardia -No longer on Apixaban -C/w Carvedilol 25 mg po BID -Monitor on Telemetry  CAD  -No Chest Pain or Anginal Symptoms -C/w Plavix 75 mg po Daily, Carvedilol 25 mg po BID; Hold Lisinopril 40 mg daily -Troponin peaked at 0.25 but have been flat   Insulin Dependent Diabetes Mellitus Type 2 -C/w Home Lantus 30 units at bedtime -Will add Sensitive Novolog SSI -Consult Diabetic Education Coordinator -CBG's running from 124-210  Liver Cirrhosis and Associated Ascites / Hepatitis C s/p Harvoni  -LFT's wnL; TBili  was elevated 2.3 and now trended down to 1.4 -CT Scan showed liver Cirrhosis -Did not really appreciate Asictes on Exam  Anemia of Chronic Disease -Hb/Hct was 10.8/32.0 on admission; Repeat was 9.0/26.6 -Repeat CMP in AM -Patient's Daugther states he was having frank blood in Urine -Continue to Monitor  Hypothyroidism -TSH was 0.635 and Free T4 was 1.18 -C/w Levothyroxine 88 mcg daily  Thrombocytopenia -Likely from Liver Cirrhosis; Platelet dropped to 92 from  -Continue to Monitor Closely and repeat CBC in AM  Elevated Troponins -Flat at 0.23, 0.25, 0.24 -Likely from Sepsis -No Anginal Symptoms or complaints, continue to monitor  Hypomagnesemia -Patient's Mag Level was 1.6 -Replete with IV Mag Sulfate -Repeat Mag Level in AM  DVT prophylaxis: SCDs Code Status: FULL CODE Family Communication: No family present at bedside Disposition Plan: Remain in SDU today and transfer to Medical floor in AM if stable  Consultants:   General Surgery  Urology   Procedures: None  Antimicrobials:  Anti-infectives    Start     Dose/Rate Route Frequency Ordered Stop   09/06/16 1800  levofloxacin (LEVAQUIN) IVPB 750 mg  Status:  Discontinued     750 mg 100 mL/hr over 90 Minutes Intravenous Every 48 hours 09/04/16 1654 09/04/16 1934   09/05/16 2200  ceFEPIme (MAXIPIME) 2 g in dextrose 5 % 50 mL IVPB  Status:  Discontinued     2 g 100 mL/hr over 30 Minutes Intravenous Every 24  hours 09/05/16 0828 09/05/16 1442   09/05/16 2200  cefTRIAXone (ROCEPHIN) 2 g in dextrose 5 % 50 mL IVPB     2 g 100 mL/hr over 30 Minutes Intravenous Every 24 hours 09/05/16 1442     09/05/16 2000  vancomycin (VANCOCIN) IVPB 750 mg/150 ml premix  Status:  Discontinued     750 mg 150 mL/hr over 60 Minutes Intravenous Every 24 hours 09/04/16 1654 09/05/16 0828   09/05/16 2000  vancomycin (VANCOCIN) IVPB 1000 mg/200 mL premix  Status:  Discontinued     1,000 mg 200 mL/hr over 60 Minutes Intravenous Every 24  hours 09/05/16 0828 09/05/16 1442   09/05/16 1445  vancomycin (VANCOCIN) 50 mg/mL oral solution 125 mg     125 mg Oral 4 times daily 09/05/16 1433 09/19/16 1359   09/04/16 2359  aztreonam (AZACTAM) 1 GM IVPB  Status:  Discontinued     1 g 100 mL/hr over 30 Minutes Intravenous Every 8 hours 09/04/16 1654 09/04/16 1934   09/04/16 2359  ceFEPIme (MAXIPIME) 1 g in dextrose 5 % 50 mL IVPB  Status:  Discontinued     1 g 100 mL/hr over 30 Minutes Intravenous Every 24 hours 09/04/16 1943 09/05/16 0828   09/04/16 1930  vancomycin (VANCOCIN) IVPB 1000 mg/200 mL premix     1,000 mg 200 mL/hr over 60 Minutes Intravenous  Once 09/04/16 1911 09/04/16 2106   09/04/16 1545  levofloxacin (LEVAQUIN) IVPB 750 mg     750 mg 100 mL/hr over 90 Minutes Intravenous  Once 09/04/16 1542 09/04/16 1849   09/04/16 1545  aztreonam (AZACTAM) 2 g in dextrose 5 % 50 mL IVPB     2 g 100 mL/hr over 30 Minutes Intravenous  Once 09/04/16 1542 09/04/16 1719   09/04/16 1545  vancomycin (VANCOCIN) IVPB 1000 mg/200 mL premix  Status:  Discontinued     1,000 mg 200 mL/hr over 60 Minutes Intravenous  Once 09/04/16 1542 09/04/16 2106     Subjective: Seen and examined at bedside and was improved and stated he had very little abdominal pain. Patient did not reveal to me that he was having diarrhea but nurse stated he was having several episodes of watery diarrhea. No CP or SOB. Thinks urine color is more clear. No other concerns or complaints at this time as patient looks improved.   Objective: Vitals:   09/05/16 0700 09/05/16 0800 09/05/16 1108 09/05/16 1200  BP: 134/85 (!) 147/74 125/61   Pulse: (!) 108 84 68   Resp: (!) 29 (!) 23 11   Temp: 98.1 F (36.7 C)   99.1 F (37.3 C)  TempSrc: Oral   Axillary  SpO2: 95% 100% 100%   Weight:      Height:        Intake/Output Summary (Last 24 hours) at 09/05/16 1456 Last data filed at 09/05/16 1100  Gross per 24 hour  Intake          2258.33 ml  Output              700 ml    Net          1558.33 ml   Filed Weights   09/04/16 1549 09/04/16 2000  Weight: 72.6 kg (160 lb) 70.7 kg (155 lb 13.8 oz)   Examination: Physical Exam:  Constitutional: NAD and appears calm and comfortable Eyes: Lids and conjunctivae normal, sclerae anicteric  ENMT: External Ears, Nose appear normal. Grossly normal hearing. Neck: Appears normal, supple, no cervical masses, normal  ROM, no appreciable thyromegaly, no JVD Respiratory: Diminished to auscultation bilaterally, no wheezing, rales, rhonchi or crackles. Normal respiratory effort and patient is not tachypenic. No accessory muscle use.  Cardiovascular: RRR, no murmurs / rubs / gallops. S1 and S2 auscultated. No extremity edema.  Abdomen: Soft, mildly tender to palpate, non-distended. No masses palpated. No appreciable hepatosplenomegaly or ascites. Bowel sounds positive x4.  GU: Deferred. Patient had foley cathter in place. Musculoskeletal: No clubbing / cyanosis of digits/nails. No joint deformity upper and lower extremities.  Skin: No rashes, lesions, ulcers Patient had discoloration of legs and oncomycosis. No induration; Warm and dry.  Neurologic: CN 2-12 grossly intact with no focal deficits. Romberg sign cerebellar reflexes not assessed.  Psychiatric: Normal judgment and insight. Alert and oriented x 3. Normal mood and appropriate affect.   Data Reviewed: I have personally reviewed following labs and imaging studies  CBC:  Recent Labs Lab 09/04/16 1435 09/05/16 0323  WBC 28.8* 14.9*  NEUTROABS 26.6* 12.7*  HGB 10.8* 9.0*  HCT 32.0* 26.6*  MCV 74.1* 74.7*  PLT 115* 92*   Basic Metabolic Panel:  Recent Labs Lab 09/04/16 1435 09/05/16 0323  NA 131* 132*  K 4.2 4.0  CL 99* 108  CO2 18* 18*  GLUCOSE 278* 170*  BUN 51* 36*  CREATININE 2.66* 1.86*  CALCIUM 9.2 7.9*  MG  --  1.6*  PHOS  --  1.8*   GFR: Estimated Creatinine Clearance: 39.1 mL/min (A) (by C-G formula based on SCr of 1.86 mg/dL (H)). Liver  Function Tests:  Recent Labs Lab 09/04/16 1435 09/05/16 0323  AST 29 17  ALT 19 14*  ALKPHOS 64 45  BILITOT 2.3* 1.4*  PROT 9.2* 7.2  ALBUMIN 3.5 2.6*    Recent Labs Lab 09/04/16 1435  LIPASE 24    Recent Labs Lab 09/04/16 1435  AMMONIA <9*   Coagulation Profile:  Recent Labs Lab 09/04/16 1435 09/05/16 0323  INR 1.67 1.60   Cardiac Enzymes:  Recent Labs Lab 09/04/16 1901 09/05/16 0209 09/05/16 0720  TROPONINI 0.23* 0.25* 0.24*   BNP (last 3 results) No results for input(s): PROBNP in the last 8760 hours. HbA1C: No results for input(s): HGBA1C in the last 72 hours. CBG:  Recent Labs Lab 09/04/16 1532 09/04/16 2221 09/05/16 0828 09/05/16 1214  GLUCAP 276* 210* 158* 124*   Lipid Profile: No results for input(s): CHOL, HDL, LDLCALC, TRIG, CHOLHDL, LDLDIRECT in the last 72 hours. Thyroid Function Tests:  Recent Labs  09/05/16 0209  TSH 0.635  FREET4 1.18*   Anemia Panel: No results for input(s): VITAMINB12, FOLATE, FERRITIN, TIBC, IRON, RETICCTPCT in the last 72 hours. Sepsis Labs:  Recent Labs Lab 09/04/16 1734 09/04/16 1901 09/04/16 2219 09/05/16 0209  PROCALCITON  --  3.44  --   --   LATICACIDVEN 2.89* 2.8* 2.4* 1.1    Recent Results (from the past 240 hour(s))  Blood Culture (routine x 2)     Status: None (Preliminary result)   Collection Time: 09/04/16  4:03 PM  Result Value Ref Range Status   Specimen Description BLOOD RIGHT ANTECUBITAL  Final   Special Requests BOTTLES DRAWN AEROBIC AND ANAEROBIC 5 CC EA  Final   Culture  Setup Time   Final    Organism ID to follow ANAEROBIC BOTTLE ONLY GRAM NEGATIVE RODS CRITICAL RESULT CALLED TO, READ BACK BY AND VERIFIED WITH: Stefan Church [id number] MLM Performed at [facility], [place] [address], [place], [place] [address]  Culture GRAM NEGATIVE RODS  Final   Report Status PENDING  Incomplete  Blood Culture ID Panel (Reflexed)     Status: Abnormal   Collection Time:  09/04/16  4:03 PM  Result Value Ref Range Status   Enterococcus species NOT DETECTED NOT DETECTED Final   Listeria monocytogenes NOT DETECTED NOT DETECTED Final   Staphylococcus species NOT DETECTED NOT DETECTED Final   Staphylococcus aureus NOT DETECTED NOT DETECTED Final   Streptococcus species NOT DETECTED NOT DETECTED Final   Streptococcus agalactiae NOT DETECTED NOT DETECTED Final   Streptococcus pneumoniae NOT DETECTED NOT DETECTED Final   Streptococcus pyogenes NOT DETECTED NOT DETECTED Final   Acinetobacter baumannii NOT DETECTED NOT DETECTED Final   Enterobacteriaceae species DETECTED (A) NOT DETECTED Final    Comment: Enterobacteriaceae represent a large family of gram-negative bacteria, not a single organism. CRITICAL RESULT CALLED TO, READ BACK BY AND VERIFIED WITH: PHARMD M LILLISON [id number] MLM    Enterobacter cloacae complex NOT DETECTED NOT DETECTED Final   Escherichia coli NOT DETECTED NOT DETECTED Final   Klebsiella oxytoca NOT DETECTED NOT DETECTED Final   Klebsiella pneumoniae NOT DETECTED NOT DETECTED Final   Proteus species DETECTED (A) NOT DETECTED Final    Comment: CRITICAL RESULT CALLED TO, READ BACK BY AND VERIFIED WITH: PHARMD M LILLISON [phone] [id number] MLM    Serratia marcescens NOT DETECTED NOT DETECTED Final   Carbapenem resistance NOT DETECTED NOT DETECTED Final   Haemophilus influenzae NOT DETECTED NOT DETECTED Final   Neisseria meningitidis NOT DETECTED NOT DETECTED Final   Pseudomonas aeruginosa NOT DETECTED NOT DETECTED Final   Candida albicans NOT DETECTED NOT DETECTED Final   Candida glabrata NOT DETECTED NOT DETECTED Final   Candida krusei NOT DETECTED NOT DETECTED Final   Candida parapsilosis NOT DETECTED NOT DETECTED Final   Candida tropicalis NOT DETECTED NOT DETECTED Final    Comment: Performed at [facility], [place] [address], [place], [place] [address]  MRSA PCR Screening     Status: None   Collection Time: 09/04/16  7:00 PM    Result Value Ref Range Status   MRSA by PCR NEGATIVE NEGATIVE Final    Comment:        The GeneXpert MRSA Assay (FDA approved for NASAL specimens only), is one component of a comprehensive MRSA colonization surveillance program. It is not intended to diagnose MRSA infection nor to guide or monitor treatment for MRSA infections.   C difficile quick scan w PCR reflex     Status: Abnormal   Collection Time: 09/05/16  1:15 PM  Result Value Ref Range Status   C Diff antigen POSITIVE (A) NEGATIVE Final   C Diff toxin POSITIVE (A) NEGATIVE Final   C Diff interpretation Toxin producing C. difficile detected.  Final    Comment: CRITICAL RESULT CALLED TO, READ BACK BY AND VERIFIED WITH: R.JOHNSON RN [phone] A.[email or web address]     Radiology Studies: Ct Abdomen Pelvis Wo Contrast  Result Date: 09/05/2016 CLINICAL DATA:  Right ureteral stent 2 weeks ago, sharp abdominal pain intermittent, nausea, vomiting starting yesterday EXAM: CT ABDOMEN AND PELVIS WITHOUT CONTRAST TECHNIQUE: Multidetector CT imaging of the abdomen and pelvis was performed following the standard protocol without IV contrast. COMPARISON:  CT scan 09/04/2016 FINDINGS: Lower chest: Cardiomegaly again noted. Trace right pleural effusion. There is small patchy atelectasis or infiltrate right lower lobe medially. Hepatobiliary: Unenhanced liver shows no biliary ductal dilatation. Again noted nodular liver contour. Cirrhosis cannot be excluded. Status post cholecystectomy.  Pancreas: Unenhanced pancreas without focal abnormality. Spleen: Unenhanced spleen with normal appearance. Adrenals/Urinary Tract: No adrenal gland mass. Again noted right ureteral stent without change in position. No hydronephrosis or hydroureter bilaterally. Again noted nonobstructive calcified calculus in lower pole of the right kidney measures 4 mm. Axial image 39 there is 7 mm calcified obstructive calculus in proximal right ureter just posterior to ureteral stent at the  level of lower endplate of L3 vertebral body. There is a Foley catheter within decompressed urinary bladder. Small amount of air within urinary bladder probable post instrumentation. Stomach/Bowel: No small bowel obstruction. No gastric outlet obstruction. No thickened or dilated small bowel loops. Contrast material noted within right colon. There is subtle mild thickening of right colonic wall. Mild adjacent pericolonic stranding. Mild colitis cannot be excluded. Please see axial image 44. No distal colonic obstruction. Contrast material noted in distal sigmoid colon and rectum. Vascular/Lymphatic: Atherosclerotic calcifications of abdominal aorta and iliac arteries are noted. No aortic aneurysm. No retroperitoneal or mesenteric adenopathy. Reproductive: Prostate gland and seminal vesicles are unremarkable. Other: Small amount of pelvic free fluid noted within posterior pelvis pre rectal region. Musculoskeletal: No destructive bony lesions are noted. There is disc space flattening with vacuum disc phenomenon and minimal anterolisthesis at L5-S1 level. Bilateral pars defect at L5 level. IMPRESSION: 1. Right ureteral stent in place. Right nonobstructive nephrolithiasis stable. 2. There is a 7 mm calcified obstructive calculus in proximal right ureter adjacent to the stent. 3. There is mild thickening of the right colonic wall and small adjacent stranding periods highly suspicious for mild colitis. 4. No small bowel or colonic obstruction. 5. There is a Foley catheter within decompressed urinary bladder. Small amount of air within bladder is probable post instrumentation. 6. Again noted atherosclerotic calcifications of abdominal aorta and iliac arteries. Next the again noted nodular contour of the liver suspicious for cirrhosis. status post cholecystectomy Electronically Signed   By: Lahoma Crocker M.D.   On: 09/05/2016 11:52   Ct Abdomen Pelvis Wo Contrast  Result Date: 09/04/2016 CLINICAL DATA:  Hematuria and flank  pain beginning this morning. History of pneumonia, kidney stones, diabetes, hepatitis-C. EXAM: CT ABDOMEN AND PELVIS WITHOUT CONTRAST TECHNIQUE: Multidetector CT imaging of the abdomen and pelvis was performed following the standard protocol without IV contrast. COMPARISON:  Abdominal ultrasound July 18, 2016 FINDINGS: Respiratory motion degraded examination LOWER CHEST: Lung bases are clear. Included heart is moderately enlarged, GI CT incompletely assessed. No pericardial effusion. HEPATOBILIARY: Status post cholecystectomy. Mildly nodular liver contour. PANCREAS: Normal. SPLEEN: Normal. ADRENALS/URINARY TRACT: Kidneys are orthotopic, demonstrating normal size and morphology. Double-pigtail nephroureteral stent with well-positioned retaining loops in RIGHT renal pelvis and bladder. 3 mm RIGHT lower pole nephrolithiasis. Limited assessment for renal masses on this nonenhanced examination. The unopacified ureters are normal in course and caliber. Urinary bladder is partially distended and unremarkable. Normal adrenal glands. STOMACH/BOWEL: The stomach, small and large bowel are normal in course and caliber, sensitivity decreased by lack of enteric contrast. Focal fluid adjacent versus within RIGHT hepatic flexure with suspected bowel wall thickening. VASCULAR/LYMPHATIC: Aortoiliac vessels are normal in course and caliber, mild calcific atherosclerosis. No lymphadenopathy by CT size criteria though limited sensitivity due to motion and noncontrast examination. REPRODUCTIVE: Mild prostatomegaly. OTHER: Trace free fluid in the pelvis and RIGHT pericolic gutter. MUSCULOSKELETAL: Non-acute. Small fat containing inguinal hernias. Osteopenia. Moderate degenerative change of the hips. IMPRESSION: 3 mm nonobstructing RIGHT nephrolithiasis. Well-positioned RIGHT nephroureteral stent. Focal fluid within the RIGHT upper quadrant likely extraluminal adjacent to  RIGHT splenic flexure, bowel pathology possible abscess not  confirmed by noncontrast respiratory motion degraded examination. Recommend follow-up CT with enteric contrast when patient able to remain still (and consider intravenous contrast within patient's renal status improves). Recommend colonoscopy after resolution of acute symptoms. Cirrhosis, status post cholecystectomy. Electronically Signed   By: Elon Alas M.D.   On: 09/04/2016 17:25   Dg Chest Port 1 View  Result Date: 09/04/2016 CLINICAL DATA:  Hematuria and flank pain since this morning. EXAM: PORTABLE CHEST 1 VIEW COMPARISON:  07/17/2016 FINDINGS: Artifact overlies chest. Pacemaker/AICD in place. Chronic left ventricular prominence. Chronic aortic atherosclerosis. The patient has taken a poor inspiration. There is chronic scarring on the left related to previous surgery. No sign of active infiltrate, collapse or effusion. IMPRESSION: Poor inspiration. Chronic left ventricular prominence. Pacemaker/ AICD. Chronic pulmonary scarring. No active process evident. Electronically Signed   By: Nelson Chimes M.D.   On: 09/04/2016 15:59   Scheduled Meds: . carvedilol  25 mg Oral BID WC  . cefTRIAXone (ROCEPHIN)  IV  2 g Intravenous Q24H  . cholecalciferol  2,000 Units Oral Daily  . gabapentin  300 mg Oral QHS  . insulin aspart  0-9 Units Subcutaneous TID WC  . insulin glargine  30 Units Subcutaneous QHS  . iopamidol      . levothyroxine  88 mcg Oral QAC breakfast  . magnesium sulfate 1 - 4 g bolus IVPB  2 g Intravenous Once  . sodium chloride flush  3 mL Intravenous Q12H  . terazosin  2 mg Oral QHS  . vancomycin  125 mg Oral QID   Continuous Infusions: . sodium chloride 100 mL/hr at 09/05/16 0525    LOS: 1 day   Kerney Elbe, DO [facility] Pager [phone]  If 7PM-7AM, please contact night-coverage [email or web address] Password [facility] 09/05/2016, 2:56 PM

## 2016-09-05 NOTE — Progress Notes (Signed)
Subjective: Patient reports he feels much better. Abd pain resolved. Nurse spoke to daughter and she said pt was on vent for 10 days after his stent placement. Prelim - -proteus and enterobacter in blood. GNR in urine.    Objective: Vital signs in last 24 hours: Temp:  [97.7 F (36.5 C)-102.7 F (39.3 C)] 99.1 F (37.3 C) (03/29 1200) Pulse Rate:  [68-119] 68 (03/29 1108) Resp:  [11-30] 11 (03/29 1108) BP: (96-162)/(55-102) 125/61 (03/29 1108) SpO2:  [94 %-100 %] 100 % (03/29 1108) Weight:  [70.7 kg (155 lb 13.8 oz)-72.6 kg (160 lb)] 70.7 kg (155 lb 13.8 oz) (03/28 2000)  Intake/Output from previous day: 03/28 0701 - 03/29 0700 In: 1658.3 [P.O.:720; I.V.:888.3; IV Piggyback:50] Out: 700 [Urine:700] Intake/Output this shift: Total I/O In: 600 [I.V.:600] Out: -   Physical Exam:  NAD HR 70's Urine clear -- excellent UOP   Lab Results:  Recent Labs  09/04/16 1435 09/05/16 0323  HGB 10.8* 9.0*  HCT 32.0* 26.6*   BMET  Recent Labs  09/04/16 1435 09/05/16 0323  NA 131* 132*  K 4.2 4.0  CL 99* 108  CO2 18* 18*  GLUCOSE 278* 170*  BUN 51* 36*  CREATININE 2.66* 1.86*  CALCIUM 9.2 7.9*    Recent Labs  09/04/16 1435 09/05/16 0323  INR 1.67 1.60   No results for input(s): LABURIN in the last 72 hours. Results for orders placed or performed during the hospital encounter of 09/04/16  Blood Culture (routine x 2)     Status: None (Preliminary result)   Collection Time: 09/04/16  4:03 PM  Result Value Ref Range Status   Specimen Description BLOOD RIGHT ANTECUBITAL  Final   Special Requests BOTTLES DRAWN AEROBIC AND ANAEROBIC 5 CC EA  Final   Culture  Setup Time   Final    Organism ID to follow ANAEROBIC BOTTLE ONLY GRAM NEGATIVE RODS CRITICAL RESULT CALLED TO, READ BACK BY AND VERIFIED WITH: Stefan Church [id number] MLM Performed at [facility], [place] [address] [place], [place] [address]    Culture GRAM NEGATIVE RODS  Final   Report Status  PENDING  Incomplete  Blood Culture ID Panel (Reflexed)     Status: Abnormal   Collection Time: 09/04/16  4:03 PM  Result Value Ref Range Status   Enterococcus species NOT DETECTED NOT DETECTED Final   Listeria monocytogenes NOT DETECTED NOT DETECTED Final   Staphylococcus species NOT DETECTED NOT DETECTED Final   Staphylococcus aureus NOT DETECTED NOT DETECTED Final   Streptococcus species NOT DETECTED NOT DETECTED Final   Streptococcus agalactiae NOT DETECTED NOT DETECTED Final   Streptococcus pneumoniae NOT DETECTED NOT DETECTED Final   Streptococcus pyogenes NOT DETECTED NOT DETECTED Final   Acinetobacter baumannii NOT DETECTED NOT DETECTED Final   Enterobacteriaceae species DETECTED (A) NOT DETECTED Final    Comment: Enterobacteriaceae represent a large family of gram-negative bacteria, not a single organism. CRITICAL RESULT CALLED TO, READ BACK BY AND VERIFIED WITH: PHARMD M LILLISON [id number] 1208PM MLM    Enterobacter cloacae complex NOT DETECTED NOT DETECTED Final   Escherichia coli NOT DETECTED NOT DETECTED Final   Klebsiella oxytoca NOT DETECTED NOT DETECTED Final   Klebsiella pneumoniae NOT DETECTED NOT DETECTED Final   Proteus species DETECTED (A) NOT DETECTED Final    Comment: CRITICAL RESULT CALLED TO, READ BACK BY AND VERIFIED WITH: PHARMD M LILLISON [id number] 1208PM MLM    Serratia marcescens NOT DETECTED NOT DETECTED Final   Carbapenem resistance  NOT DETECTED NOT DETECTED Final   Haemophilus influenzae NOT DETECTED NOT DETECTED Final   Neisseria meningitidis NOT DETECTED NOT DETECTED Final   Pseudomonas aeruginosa NOT DETECTED NOT DETECTED Final   Candida albicans NOT DETECTED NOT DETECTED Final   Candida glabrata NOT DETECTED NOT DETECTED Final   Candida krusei NOT DETECTED NOT DETECTED Final   Candida parapsilosis NOT DETECTED NOT DETECTED Final   Candida tropicalis NOT DETECTED NOT DETECTED Final    Comment: Performed at [facility], [place] [address]  [place], [place] [address]  MRSA PCR Screening     Status: None   Collection Time: 09/04/16  7:00 PM  Result Value Ref Range Status   MRSA by PCR NEGATIVE NEGATIVE Final    Comment:        The GeneXpert MRSA Assay (FDA approved for NASAL specimens only), is one component of a comprehensive MRSA colonization surveillance program. It is not intended to diagnose MRSA infection nor to guide or monitor treatment for MRSA infections.     Studies/Results: Ct Abdomen Pelvis Wo Contrast  Result Date: 09/05/2016 CLINICAL DATA:  Right ureteral stent 2 weeks ago, sharp abdominal pain intermittent, nausea, vomiting starting yesterday EXAM: CT ABDOMEN AND PELVIS WITHOUT CONTRAST TECHNIQUE: Multidetector CT imaging of the abdomen and pelvis was performed following the standard protocol without IV contrast. COMPARISON:  CT scan 09/04/2016 FINDINGS: Lower chest: Cardiomegaly again noted. Trace right pleural effusion. There is small patchy atelectasis or infiltrate right lower lobe medially. Hepatobiliary: Unenhanced liver shows no biliary ductal dilatation. Again noted nodular liver contour. Cirrhosis cannot be excluded. Status post cholecystectomy. Pancreas: Unenhanced pancreas without focal abnormality. Spleen: Unenhanced spleen with normal appearance. Adrenals/Urinary Tract: No adrenal gland mass. Again noted right ureteral stent without change in position. No hydronephrosis or hydroureter bilaterally. Again noted nonobstructive calcified calculus in lower pole of the right kidney measures 4 mm. Axial image 39 there is 7 mm calcified obstructive calculus in proximal right ureter just posterior to ureteral stent at the level of lower endplate of L3 vertebral body. There is a Foley catheter within decompressed urinary bladder. Small amount of air within urinary bladder probable post instrumentation. Stomach/Bowel: No small bowel obstruction. No gastric outlet obstruction. No thickened or dilated small bowel loops.  Contrast material noted within right colon. There is subtle mild thickening of right colonic wall. Mild adjacent pericolonic stranding. Mild colitis cannot be excluded. Please see axial image 44. No distal colonic obstruction. Contrast material noted in distal sigmoid colon and rectum. Vascular/Lymphatic: Atherosclerotic calcifications of abdominal aorta and iliac arteries are noted. No aortic aneurysm. No retroperitoneal or mesenteric adenopathy. Reproductive: Prostate gland and seminal vesicles are unremarkable. Other: Small amount of pelvic free fluid noted within posterior pelvis pre rectal region. Musculoskeletal: No destructive bony lesions are noted. There is disc space flattening with vacuum disc phenomenon and minimal anterolisthesis at L5-S1 level. Bilateral pars defect at L5 level. IMPRESSION: 1. Right ureteral stent in place. Right nonobstructive nephrolithiasis stable. 2. There is a 7 mm calcified obstructive calculus in proximal right ureter adjacent to the stent. 3. There is mild thickening of the right colonic wall and small adjacent stranding periods highly suspicious for mild colitis. 4. No small bowel or colonic obstruction. 5. There is a Foley catheter within decompressed urinary bladder. Small amount of air within bladder is probable post instrumentation. 6. Again noted atherosclerotic calcifications of abdominal aorta and iliac arteries. Next the again noted nodular contour of the liver suspicious for cirrhosis. status post  cholecystectomy Electronically Signed   By: Lahoma Crocker M.D.   On: 09/05/2016 11:52   Ct Abdomen Pelvis Wo Contrast  Result Date: 09/04/2016 CLINICAL DATA:  Hematuria and flank pain beginning this morning. History of pneumonia, kidney stones, diabetes, hepatitis-C. EXAM: CT ABDOMEN AND PELVIS WITHOUT CONTRAST TECHNIQUE: Multidetector CT imaging of the abdomen and pelvis was performed following the standard protocol without IV contrast. COMPARISON:  Abdominal ultrasound  July 18, 2016 FINDINGS: Respiratory motion degraded examination LOWER CHEST: Lung bases are clear. Included heart is moderately enlarged, GI CT incompletely assessed. No pericardial effusion. HEPATOBILIARY: Status post cholecystectomy. Mildly nodular liver contour. PANCREAS: Normal. SPLEEN: Normal. ADRENALS/URINARY TRACT: Kidneys are orthotopic, demonstrating normal size and morphology. Double-pigtail nephroureteral stent with well-positioned retaining loops in RIGHT renal pelvis and bladder. 3 mm RIGHT lower pole nephrolithiasis. Limited assessment for renal masses on this nonenhanced examination. The unopacified ureters are normal in course and caliber. Urinary bladder is partially distended and unremarkable. Normal adrenal glands. STOMACH/BOWEL: The stomach, small and large bowel are normal in course and caliber, sensitivity decreased by lack of enteric contrast. Focal fluid adjacent versus within RIGHT hepatic flexure with suspected bowel wall thickening. VASCULAR/LYMPHATIC: Aortoiliac vessels are normal in course and caliber, mild calcific atherosclerosis. No lymphadenopathy by CT size criteria though limited sensitivity due to motion and noncontrast examination. REPRODUCTIVE: Mild prostatomegaly. OTHER: Trace free fluid in the pelvis and RIGHT pericolic gutter. MUSCULOSKELETAL: Non-acute. Small fat containing inguinal hernias. Osteopenia. Moderate degenerative change of the hips. IMPRESSION: 3 mm nonobstructing RIGHT nephrolithiasis. Well-positioned RIGHT nephroureteral stent. Focal fluid within the RIGHT upper quadrant likely extraluminal adjacent to RIGHT splenic flexure, bowel pathology possible abscess not confirmed by noncontrast respiratory motion degraded examination. Recommend follow-up CT with enteric contrast when patient able to remain still (and consider intravenous contrast within patient's renal status improves). Recommend colonoscopy after resolution of acute symptoms. Cirrhosis, status post  cholecystectomy. Electronically Signed   By: Elon Alas M.D.   On: 09/04/2016 17:25   Dg Chest Port 1 View  Result Date: 09/04/2016 CLINICAL DATA:  Hematuria and flank pain since this morning. EXAM: PORTABLE CHEST 1 VIEW COMPARISON:  07/17/2016 FINDINGS: Artifact overlies chest. Pacemaker/AICD in place. Chronic left ventricular prominence. Chronic aortic atherosclerosis. The patient has taken a poor inspiration. There is chronic scarring on the left related to previous surgery. No sign of active infiltrate, collapse or effusion. IMPRESSION: Poor inspiration. Chronic left ventricular prominence. Pacemaker/ AICD. Chronic pulmonary scarring. No active process evident. Electronically Signed   By: Nelson Chimes M.D.   On: 09/04/2016 15:59    Assessment/Plan: -sepsis with likely pyelonephritis s/p right ureteral stent for right proximal stone at [facility] 3 weeks ago --  -clinically much improved -- good UOP, pain resolved, wbc and Cr better  -once cultures are known and he transfers to po, I'd treat him twice a day for a total of 14 days and then one po nightly to last him through Oct 06, 2016 f/u at the [facility] for stone procedure (write him for #30 or more pills).  I will sign off. Please call [facility] on call with any questions or concerns.     LOS: 1 day   Alylah Blakney 09/05/2016, 1:59 PM

## 2016-09-05 NOTE — Progress Notes (Addendum)
Subjective: Feeling a little better, but still very weak.    Objective: Vital signs in last 24 hours: Temp:  [98.1 F (36.7 C)-102 F (38.9 C)] 99.1 F (37.3 C) (03/29 1200) Pulse Rate:  [68-119] 74 (03/29 1546) Resp:  [11-30] 25 (03/29 1546) BP: (96-162)/(53-102) 122/53 (03/29 1546) SpO2:  [94 %-100 %] 100 % (03/29 1546) Weight:  [70.7 kg (155 lb 13.8 oz)] 70.7 kg (155 lb 13.8 oz) (03/28 2000)    Intake/Output from previous day: 03/28 0701 - 03/29 0700 In: 1658.3 [P.O.:720; I.V.:888.3; IV Piggyback:50] Out: 700 [Urine:700] Intake/Output this shift: Total I/O In: 650 [I.V.:600; IV Piggyback:50] Out: -   General appearance: alert, cooperative and mild distress Resp: breathing comfortably Cardio: regular rate and rhythm GI: soft, mildly distended.  Mild right abdominal tenderness  Lab Results:   Recent Labs  09/04/16 1435 09/05/16 0323  WBC 28.8* 14.9*  HGB 10.8* 9.0*  HCT 32.0* 26.6*  PLT 115* 92*   BMET  Recent Labs  09/04/16 1435 09/05/16 0323  NA 131* 132*  K 4.2 4.0  CL 99* 108  CO2 18* 18*  GLUCOSE 278* 170*  BUN 51* 36*  CREATININE 2.66* 1.86*  CALCIUM 9.2 7.9*   PT/INR  Recent Labs  09/04/16 1435 09/05/16 0323  LABPROT 19.9* 19.2*  INR 1.67 1.60   ABG No results for input(s): PHART, HCO3 in the last 72 hours.  Invalid input(s): PCO2, PO2  Studies/Results: Ct Abdomen Pelvis Wo Contrast  Result Date: 09/05/2016 CLINICAL DATA:  Right ureteral stent 2 weeks ago, sharp abdominal pain intermittent, nausea, vomiting starting yesterday EXAM: CT ABDOMEN AND PELVIS WITHOUT CONTRAST TECHNIQUE: Multidetector CT imaging of the abdomen and pelvis was performed following the standard protocol without IV contrast. COMPARISON:  CT scan 09/04/2016 FINDINGS: Lower chest: Cardiomegaly again noted. Trace right pleural effusion. There is small patchy atelectasis or infiltrate right lower lobe medially. Hepatobiliary: Unenhanced liver shows no biliary  ductal dilatation. Again noted nodular liver contour. Cirrhosis cannot be excluded. Status post cholecystectomy. Pancreas: Unenhanced pancreas without focal abnormality. Spleen: Unenhanced spleen with normal appearance. Adrenals/Urinary Tract: No adrenal gland mass. Again noted right ureteral stent without change in position. No hydronephrosis or hydroureter bilaterally. Again noted nonobstructive calcified calculus in lower pole of the right kidney measures 4 mm. Axial image 39 there is 7 mm calcified obstructive calculus in proximal right ureter just posterior to ureteral stent at the level of lower endplate of L3 vertebral body. There is a Foley catheter within decompressed urinary bladder. Small amount of air within urinary bladder probable post instrumentation. Stomach/Bowel: No small bowel obstruction. No gastric outlet obstruction. No thickened or dilated small bowel loops. Contrast material noted within right colon. There is subtle mild thickening of right colonic wall. Mild adjacent pericolonic stranding. Mild colitis cannot be excluded. Please see axial image 44. No distal colonic obstruction. Contrast material noted in distal sigmoid colon and rectum. Vascular/Lymphatic: Atherosclerotic calcifications of abdominal aorta and iliac arteries are noted. No aortic aneurysm. No retroperitoneal or mesenteric adenopathy. Reproductive: Prostate gland and seminal vesicles are unremarkable. Other: Small amount of pelvic free fluid noted within posterior pelvis pre rectal region. Musculoskeletal: No destructive bony lesions are noted. There is disc space flattening with vacuum disc phenomenon and minimal anterolisthesis at L5-S1 level. Bilateral pars defect at L5 level. IMPRESSION: 1. Right ureteral stent in place. Right nonobstructive nephrolithiasis stable. 2. There is a 7 mm calcified obstructive calculus in proximal right ureter adjacent to the stent. 3. There is mild thickening  of the right colonic wall and  small adjacent stranding periods highly suspicious for mild colitis. 4. No small bowel or colonic obstruction. 5. There is a Foley catheter within decompressed urinary bladder. Small amount of air within bladder is probable post instrumentation. 6. Again noted atherosclerotic calcifications of abdominal aorta and iliac arteries. Next the again noted nodular contour of the liver suspicious for cirrhosis. status post cholecystectomy Electronically Signed   By: Lahoma Crocker M.D.   On: 09/05/2016 11:52   Ct Abdomen Pelvis Wo Contrast  Result Date: 09/04/2016 CLINICAL DATA:  Hematuria and flank pain beginning this morning. History of pneumonia, kidney stones, diabetes, hepatitis-C. EXAM: CT ABDOMEN AND PELVIS WITHOUT CONTRAST TECHNIQUE: Multidetector CT imaging of the abdomen and pelvis was performed following the standard protocol without IV contrast. COMPARISON:  Abdominal ultrasound July 18, 2016 FINDINGS: Respiratory motion degraded examination LOWER CHEST: Lung bases are clear. Included heart is moderately enlarged, GI CT incompletely assessed. No pericardial effusion. HEPATOBILIARY: Status post cholecystectomy. Mildly nodular liver contour. PANCREAS: Normal. SPLEEN: Normal. ADRENALS/URINARY TRACT: Kidneys are orthotopic, demonstrating normal size and morphology. Double-pigtail nephroureteral stent with well-positioned retaining loops in RIGHT renal pelvis and bladder. 3 mm RIGHT lower pole nephrolithiasis. Limited assessment for renal masses on this nonenhanced examination. The unopacified ureters are normal in course and caliber. Urinary bladder is partially distended and unremarkable. Normal adrenal glands. STOMACH/BOWEL: The stomach, small and large bowel are normal in course and caliber, sensitivity decreased by lack of enteric contrast. Focal fluid adjacent versus within RIGHT hepatic flexure with suspected bowel wall thickening. VASCULAR/LYMPHATIC: Aortoiliac vessels are normal in course and caliber,  mild calcific atherosclerosis. No lymphadenopathy by CT size criteria though limited sensitivity due to motion and noncontrast examination. REPRODUCTIVE: Mild prostatomegaly. OTHER: Trace free fluid in the pelvis and RIGHT pericolic gutter. MUSCULOSKELETAL: Non-acute. Small fat containing inguinal hernias. Osteopenia. Moderate degenerative change of the hips. IMPRESSION: 3 mm nonobstructing RIGHT nephrolithiasis. Well-positioned RIGHT nephroureteral stent. Focal fluid within the RIGHT upper quadrant likely extraluminal adjacent to RIGHT splenic flexure, bowel pathology possible abscess not confirmed by noncontrast respiratory motion degraded examination. Recommend follow-up CT with enteric contrast when patient able to remain still (and consider intravenous contrast within patient's renal status improves). Recommend colonoscopy after resolution of acute symptoms. Cirrhosis, status post cholecystectomy. Electronically Signed   By: Elon Alas M.D.   On: 09/04/2016 17:25   Dg Chest Port 1 View  Result Date: 09/04/2016 CLINICAL DATA:  Hematuria and flank pain since this morning. EXAM: PORTABLE CHEST 1 VIEW COMPARISON:  07/17/2016 FINDINGS: Artifact overlies chest. Pacemaker/AICD in place. Chronic left ventricular prominence. Chronic aortic atherosclerosis. The patient has taken a poor inspiration. There is chronic scarring on the left related to previous surgery. No sign of active infiltrate, collapse or effusion. IMPRESSION: Poor inspiration. Chronic left ventricular prominence. Pacemaker/ AICD. Chronic pulmonary scarring. No active process evident. Electronically Signed   By: Nelson Chimes M.D.   On: 09/04/2016 15:59    Anti-infectives: Anti-infectives    Start     Dose/Rate Route Frequency Ordered Stop   09/06/16 1800  levofloxacin (LEVAQUIN) IVPB 750 mg  Status:  Discontinued     750 mg 100 mL/hr over 90 Minutes Intravenous Every 48 hours 09/04/16 1654 09/04/16 1934   09/05/16 2200  ceFEPIme  (MAXIPIME) 2 g in dextrose 5 % 50 mL IVPB  Status:  Discontinued     2 g 100 mL/hr over 30 Minutes Intravenous Every 24 hours 09/05/16 0828 09/05/16 1442   09/05/16 2200  cefTRIAXone (ROCEPHIN) 2 g in dextrose 5 % 50 mL IVPB     2 g 100 mL/hr over 30 Minutes Intravenous Every 24 hours 09/05/16 1442     09/05/16 2000  vancomycin (VANCOCIN) IVPB 750 mg/150 ml premix  Status:  Discontinued     750 mg 150 mL/hr over 60 Minutes Intravenous Every 24 hours 09/04/16 1654 09/05/16 0828   09/05/16 2000  vancomycin (VANCOCIN) IVPB 1000 mg/200 mL premix  Status:  Discontinued     1,000 mg 200 mL/hr over 60 Minutes Intravenous Every 24 hours 09/05/16 0828 09/05/16 1442   09/05/16 1445  vancomycin (VANCOCIN) 50 mg/mL oral solution 125 mg     125 mg Oral 4 times daily 09/05/16 1433 09/19/16 1359   09/04/16 2359  aztreonam (AZACTAM) 1 GM IVPB  Status:  Discontinued     1 g 100 mL/hr over 30 Minutes Intravenous Every 8 hours 09/04/16 1654 09/04/16 1934   09/04/16 2359  ceFEPIme (MAXIPIME) 1 g in dextrose 5 % 50 mL IVPB  Status:  Discontinued     1 g 100 mL/hr over 30 Minutes Intravenous Every 24 hours 09/04/16 1943 09/05/16 0828   09/04/16 1930  vancomycin (VANCOCIN) IVPB 1000 mg/200 mL premix     1,000 mg 200 mL/hr over 60 Minutes Intravenous  Once 09/04/16 1911 09/04/16 2106   09/04/16 1545  levofloxacin (LEVAQUIN) IVPB 750 mg     750 mg 100 mL/hr over 90 Minutes Intravenous  Once 09/04/16 1542 09/04/16 1849   09/04/16 1545  aztreonam (AZACTAM) 2 g in dextrose 5 % 50 mL IVPB     2 g 100 mL/hr over 30 Minutes Intravenous  Once 09/04/16 1542 09/04/16 1719   09/04/16 1545  vancomycin (VANCOCIN) IVPB 1000 mg/200 mL premix  Status:  Discontinued     1,000 mg 200 mL/hr over 60 Minutes Intravenous  Once 09/04/16 1542 09/04/16 2106      Assessment/Plan: s/p * No surgery found *  Sepsis c diff colitis. Treat for c difficile  Call if concerns for perforation or toxic megacolon.     LOS: 1 day     [facility] 09/05/2016

## 2016-09-05 NOTE — Progress Notes (Signed)
Initial Nutrition Assessment  DOCUMENTATION CODES:   Not applicable  INTERVENTION:  - Will order daily multivitamin with minerals. - Continue to encourage PO intakes of meals and beverages. - RD will continue to monitor for additional needs.  NUTRITION DIAGNOSIS:   Inadequate oral intake related to acute illness, poor appetite as evidenced by per patient/family report.  GOAL:   Patient will meet greater than or equal to 90% of their needs  MONITOR:   PO intake, Weight trends, Labs, I & O's  REASON FOR ASSESSMENT:   Malnutrition Screening Tool  ASSESSMENT:   66 y.o. male with medical history significant of Atrial Flutter s/p Ablation x2 and Cardioversion, CAD, Insulin Dependent Diabetes Mellitus x2, Hepatitis s/p Treatment with Harvoni, Liver Cirrhosis with Ascites, Anemia of Chronic Disease and other comorbid conditions who presented to [facility] with a cc of of Left Sided Abdominal Pain. Patient states he recently had a Right Ureteral Stent 2 weeks ago and subsequently developed sharp abdominal pain that was intermittent with no clear onset. Patient states it felt like a "knife" sometimes and states he had associated Nausea and bilious non-bloody Vomiting since yesterday. Of note patient was recently admitted for Pancolitis and Rectal Bleeding. Patient did have fevers and chills as well. Had no bloody stools but daughter became concerned and noticed that he had gross hematuria so she brought him to the ED for evaluation.   Pt seen for MST. BMI indicates normal weight. No intakes documented since admission. Pt reports that PTA his appetite was variable and some days were good and some days were not good. He states that this is was d/t abdominal pain x2 weeks and began having N/V PTA.  Physical assessment shows no muscle or fat wasting at this time. Per chart review, pt has lost 5 lbs (3% body weight) in the past 1 month which is not significant for time frame.   Will order multivitamin  and will continue to monitor PO intakes, symptoms, and need for oral nutrition supplements during hospitalization.  Medications reviewed; 2000 units vitamin/day, sliding scale Novolog, 30 units Lantus/day, 88 mcg oral Synthroid/day, 2 g IV Mg sulfate x1 run today. Labs reviewed; CBGs: 124 and 158 mg/dL, Na: 132 mmol/L, BUN: 36 mmol/L, creatinine: 1.88 mg/dL, Ca: 7.9 mg/dL, Phos: 1.8 mg/dL, Mg: 1.6 mg/dL, GFR: 42 mL/min.  IVF: NS @ 100 mL/hr.    Diet Order:  Diet heart healthy/carb modified Room service appropriate? Yes; Fluid consistency: Thin  Skin:  Reviewed, no issues  Last BM:  3/29  Height:   Ht Readings from Last 1 Encounters:  09/04/16 6\' 1"  (1.854 m)    Weight:   Wt Readings from Last 1 Encounters:  09/04/16 155 lb 13.8 oz (70.7 kg)    Ideal Body Weight:  83.64 kg  BMI:  Body mass index is 20.56 kg/m.  Estimated Nutritional Needs:   Kcal:  1625-1840 (23-26 kcal/kg)  Protein:  78-92 grams (1.1-1.3 grams/kg)  Fluid:  >/= 1.7 L/day  EDUCATION NEEDS:   No education needs identified at this time    Jarome Matin, MS, RD, LDN, CNSC Inpatient Clinical Dietitian Pager # [phone] After hours/weekend pager # [phone]

## 2016-09-05 NOTE — Progress Notes (Signed)
PT Cancellation Note  Patient Details Name: Anthony Mcfarland MRN: [id number] DOB: 04-Jan-1951   Cancelled Treatment:    Reason Eval/Treat Not Completed: Patient not medically ready (per RN, having diarrhea. suggests to  hold until tomorrow . )   Marcelino Freestone PT [phone]  09/05/2016, 1:40 PM

## 2016-09-05 NOTE — Progress Notes (Signed)
CRITICAL VALUE ALERT  Critical value received:  Lactic Acid  Date of notification: 09/04/2016  Time of notification:  2314  Critical value read back: Yes  Nurse who received alert:  Matt Holmes  MD notified (1st page):  Triad  Time of first page: 2316  Responding MD:  Triad  Time MD responded:  2316  Orders received to administer 571mL bolus. Bolus given.

## 2016-09-05 NOTE — Progress Notes (Signed)
Pharmacy Antibiotic Note  Anthony Mcfarland is a 66 y.o. male admitted on 09/04/2016 with sepsis likely secondary to UTI, question of possible abdominal abscess.  Pharmacy has been consulted for vancomycin and cefepime dosing.  AKI improving today.  WBC, LA decreasing.  Plan: With improved SCr, adjust Cefepime to 2g IV q24h and Vancomycin to 1g IV q24h. F/u daily SCr and culture results.  Height: 6\' 1"  (185.4 cm) Weight: 155 lb 13.8 oz (70.7 kg) IBW/kg (Calculated) : 79.9  Temp (24hrs), Avg:99.7 F (37.6 C), Min:97.7 F (36.5 C), Max:102.7 F (39.3 C)   Recent Labs Lab 09/04/16 1435 09/04/16 1445 09/04/16 1734 09/04/16 1901 09/04/16 2219 09/05/16 0209 09/05/16 0323  WBC 28.8*  --   --   --   --   --  14.9*  CREATININE 2.66*  --   --   --   --   --  1.86*  LATICACIDVEN  --  3.73* 2.89* 2.8* 2.4* 1.1  --     Estimated Creatinine Clearance: 39.1 mL/min (A) (by C-G formula based on SCr of 1.86 mg/dL (H)).    Allergies  Allergen Reactions  . Grapefruit Extract Other (See Comments)    [facility] told him not to eat  . Amoxicillin Other (See Comments)    Unknown reaction - can tolerate Cephalosporins Has patient had a PCN reaction causing immediate rash, facial/tongue/throat swelling, SOB or lightheadedness with hypotension: unknown Has patient had a PCN reaction causing severe rash involving mucus membranes or skin necrosis: unknown Has patient had a PCN reaction that required hospitalization: unknown Has patient had a PCN reaction occurring within the last 10 years: unknown If all of the above answers are "NO", then may proceed with Cephalosporin    Antimicrobials this admission: 3/28 Aztreonam >> 3/28 3/28 Levaquin >> 3/28 3/28 Vanc >> 3/29 Cefepime>>   Dose adjustments this admission:   Microbiology results: 3/28 BCx: sent 3/28 UCx: sent  Thank you for allowing pharmacy to be a part of this patient's care.  Hershal Coria 09/05/2016 8:22 AM

## 2016-09-06 LAB — CBC WITH DIFFERENTIAL/PLATELET
BASOS ABS: 0 10*3/uL (ref 0.0–0.1)
Basophils Relative: 0 %
EOS PCT: 0 %
Eosinophils Absolute: 0 10*3/uL (ref 0.0–0.7)
HEMATOCRIT: 23.1 % — AB (ref 39.0–52.0)
Hemoglobin: 7.9 g/dL — ABNORMAL LOW (ref 13.0–17.0)
LYMPHS ABS: 1.1 10*3/uL (ref 0.7–4.0)
LYMPHS PCT: 9 %
MCH: 25.7 pg — AB (ref 26.0–34.0)
MCHC: 34.2 g/dL (ref 30.0–36.0)
MCV: 75.2 fL — AB (ref 78.0–100.0)
Monocytes Absolute: 1.1 10*3/uL — ABNORMAL HIGH (ref 0.1–1.0)
Monocytes Relative: 9 %
NEUTROS ABS: 9.9 10*3/uL — AB (ref 1.7–7.7)
Neutrophils Relative %: 82 %
Platelets: 84 10*3/uL — ABNORMAL LOW (ref 150–400)
RBC: 3.07 MIL/uL — ABNORMAL LOW (ref 4.22–5.81)
RDW: 17.7 % — AB (ref 11.5–15.5)
WBC: 12.1 10*3/uL — ABNORMAL HIGH (ref 4.0–10.5)

## 2016-09-06 LAB — COMPREHENSIVE METABOLIC PANEL
ALT: 13 U/L — ABNORMAL LOW (ref 17–63)
AST: 20 U/L (ref 15–41)
Albumin: 2.3 g/dL — ABNORMAL LOW (ref 3.5–5.0)
Alkaline Phosphatase: 42 U/L (ref 38–126)
Anion gap: 6 (ref 5–15)
BILIRUBIN TOTAL: 0.6 mg/dL (ref 0.3–1.2)
BUN: 23 mg/dL — ABNORMAL HIGH (ref 6–20)
CO2: 17 mmol/L — ABNORMAL LOW (ref 22–32)
CREATININE: 1.61 mg/dL — AB (ref 0.61–1.24)
Calcium: 7.7 mg/dL — ABNORMAL LOW (ref 8.9–10.3)
Chloride: 107 mmol/L (ref 101–111)
GFR, EST AFRICAN AMERICAN: 50 mL/min — AB (ref >60)
GFR, EST NON AFRICAN AMERICAN: 43 mL/min — AB (ref >60)
Glucose, Bld: 111 mg/dL — ABNORMAL HIGH (ref 65–99)
Potassium: 3.7 mmol/L (ref 3.5–5.1)
Sodium: 130 mmol/L — ABNORMAL LOW (ref 135–145)
TOTAL PROTEIN: 6.3 g/dL — AB (ref 6.5–8.1)

## 2016-09-06 LAB — MAGNESIUM: Magnesium: 1.8 mg/dL (ref 1.7–2.4)

## 2016-09-06 LAB — GASTROINTESTINAL PANEL BY PCR, STOOL (REPLACES STOOL CULTURE)
Adenovirus F40/41: NOT DETECTED
Astrovirus: NOT DETECTED
CAMPYLOBACTER SPECIES: NOT DETECTED
CRYPTOSPORIDIUM: NOT DETECTED
Cyclospora cayetanensis: NOT DETECTED
Entamoeba histolytica: NOT DETECTED
Enteroaggregative E coli (EAEC): NOT DETECTED
Enteropathogenic E coli (EPEC): NOT DETECTED
Enterotoxigenic E coli (ETEC): NOT DETECTED
Giardia lamblia: NOT DETECTED
Norovirus GI/GII: NOT DETECTED
PLESIMONAS SHIGELLOIDES: NOT DETECTED
ROTAVIRUS A: NOT DETECTED
SALMONELLA SPECIES: NOT DETECTED
SHIGA LIKE TOXIN PRODUCING E COLI (STEC): NOT DETECTED
SHIGELLA/ENTEROINVASIVE E COLI (EIEC): NOT DETECTED
Sapovirus (I, II, IV, and V): NOT DETECTED
Vibrio cholerae: NOT DETECTED
Vibrio species: NOT DETECTED
YERSINIA ENTEROCOLITICA: NOT DETECTED

## 2016-09-06 LAB — PROCALCITONIN: Procalcitonin: 2.53 ng/mL

## 2016-09-06 LAB — CREATININE, SERUM
CREATININE: 1.59 mg/dL — AB (ref 0.61–1.24)
GFR calc Af Amer: 51 mL/min — ABNORMAL LOW (ref >60)
GFR calc non Af Amer: 44 mL/min — ABNORMAL LOW (ref >60)

## 2016-09-06 LAB — URINE CULTURE: CULTURE: NO GROWTH

## 2016-09-06 LAB — GLUCOSE, CAPILLARY
Glucose-Capillary: 113 mg/dL — ABNORMAL HIGH (ref 65–99)
Glucose-Capillary: 139 mg/dL — ABNORMAL HIGH (ref 65–99)
Glucose-Capillary: 160 mg/dL — ABNORMAL HIGH (ref 65–99)
Glucose-Capillary: 156 mg/dL — ABNORMAL HIGH (ref 65–99)

## 2016-09-06 LAB — PHOSPHORUS: PHOSPHORUS: 2 mg/dL — AB (ref 2.5–4.6)

## 2016-09-06 LAB — HEMOGLOBIN A1C
HEMOGLOBIN A1C: 6.5 % — AB (ref 4.8–5.6)
Mean Plasma Glucose: 140 mg/dL

## 2016-09-06 NOTE — Progress Notes (Signed)
PROGRESS NOTE    Anthony Mcfarland  ION:[id number] DOB: 28-Jan-1951 DOA: 09/04/2016 PCP: Ronnie Doss, MD   Brief Narrative:  Anthony Mcfarland is a 65 y.o. male with medical history significant of Atrial Flutter s/p Ablation x2 and Cardioversion, CAD, Insulin Dependent Diabetes Mellitus x2, Hepatitis s/p Treatment with Harvoni, Liver Cirrhosis with Ascites, Anemia of Chronic Disease and other comorbid conditions who presented to [facility] with a cc of of Left Sided Abdominal Pain. Patient states he recently had a Right Ureteral Stent 2 weeks ago and subsequently developed sharp abdominal pain that was intermittent with no clear onset. Patient states it felt like a "knife" sometimes and states he had associated Nausea and bilious non-bloody Vomiting since yesterday. Of note patient was recently admitted for Pancolitis and Rectal Bleeding. Patient did have fevers and chills as well. Had no bloody stools but daughter became concerned and noticed that he had gross hematuria so she brought him to the ED for evaluation. [facility] admitted him for sepsis and was found to have a UTI as well as Proteus Bacteremia. I The suspected abscess was not seen on CT Abd/Pelvis with Contrast and there was a mild colitis noted because of mild thickening of the right colonic wall and small adjacent stranding periods. C Difficile was checked and was positive because patient was having significant diarrhea. He was improving and felt better today and complained of no abdominal pain.    Assessment & Plan:   Principal Problem:   Sepsis secondary to UTI [facility]) Active Problems:   Hyperglycemia   Thrombocytopenia (HCC)   Diabetes mellitus type 2, insulin dependent ([place])   Coronary artery disease   Anemia   Cirrhosis (HCC)   Intra-abdominal fluid collection - R flank ? abscess   Bacteremia due to Gram-negative bacteria   Proteus septicemia (HCC)   C. difficile colitis   Hypomagnesemia  Sepsis 2/2 to Pyelonephritis/UTI, Proteus  Bacteremia and C Difficile Colitis, improving -Patient was febrile at 102, Tachycardic at 129, RR of 26, WBC of 28.8 and suspected infection of UTI (though no Urinalysis or Urine Cx was drawn prior to initiation of Abx) because of recent Right Ureteral Stent -Sepsis physiology improving  -Sepsis Protocol Initiated and patient received 2.25 Liters in ED (30 cc/kg) -Unfortunately EDP gave patient Abx before obtaining Urinalysis or Urine Cx -Blood Cx's were also drawn after Abx were initiated; Blood Cx positive for Enterobacterciae Proteus Mirabilis with susceptibilities to follow -Lactic Acid Level was elevated at 3.73 and trending down to 2.89 and trended down to 1.1 after fluid boluses -Procalcitonin obtained and was 3.44; Repeat was 2.53 -CXR showed poor Inspiration. Chronic Left Ventricular Prominence with poor inspiration, Chronic Pulmonary Scarring, and a Pacemaker/AICD present with no active process  -CT Scan of Abd/Pelvis showed 3 mm Non-Obstructing Right Nephrolithiasis and well positioned Right Nephroureteral Stent as well as a Focal Fluid within the Right Upper Quadrant likely extraluminal adjacent to Right Splenic Flexure/Hepatic Flexure, bowel pathology and possible abscess -Repeat CT with po Contrast showed right ureteral stent in place and Right non-obstructive nephrolithiasis stable as well as a 7 mm calcified obstructive calculus in the proximal Right Ureter adjacent to the stent along with mild thickening of the Right Colonic Wall and small adjacent stranding periods highly suspicious for mild colitis.  -**Patient recently had colonoscopy 07/22/16 and showed a 7 mm polyp in the transverse colon and erythematous mucosa in the ascending colon in the cecum that was biopsied and there examination was otherwise normal -General Surgery Consulted by  EDP and patient being seen by Dr. Johney Maine -Urology also consulted by EDP and appreciated Recc's -Changed Broad Spectrum Abx of IV Cefepime and IV  Vancomycin as Blood Cx grew out GNR Proteus (Sensitivities still pending) to IV Ceftriaxone 2 grams q24h -C/w Maintenance Fluid with NS at 100 mL/hr -WBC went from 28.8 -> 14.9 -> 12.1 -PER Nurse patient was having significant amount of Watery Diarrhea; Checked for C. Difficile and patient was positive for C Difficile; C/w Enteric Precautions -C/w Oral Vancomycin -Urinalysis done finally and showed Many Bacteria, Large Blood with TNTC RBC, Large Leukocytes, Negative Nitrites, and TNTC WBC -Urine Cx Showed No Growth -Per Urology Recc's once Cx's are known and he transfers to po, would treat him BID x 14 days and then po Nightly and [facility].   AKI on suspected CKD Stage 3, Improving -Upon Review of Prior Records Patient's Baseline Cr appears to be 1.2-1.4 -Patient's BUN/Cr on Admission is 51/2.66 -> Now down to 23/1.59 -C/w IVF with NS at 100 mL/hr -Avoid Nephrotoxic Medications and will hold Furosemide 20 mg po Daily and Lisinopril 40 mg daily -Urology placed foley catheter  -Appreciate Urology Input as patient recently had Right Ureteral Stent and now having Hematuria -Repeat CMP in AM  Hematuria, improved -Patient's Urinalysis showed Large Hgb and TNTC RBC -UDS showed patient was positive for Opiates and Cocaine  -Since Patient recently had Right Ureteral Stent appreciate Urology Evaluation and Recc's -Continue to Monitor with Foley Catheter in place for now  Hx of Atrial Flutter s/p Ablation x2 and Cardioversion currently in Sinus Tachycardia -No longer on Apixaban -C/w Carvedilol 25 mg po BID -Monitor on Telemetry  CAD  -No Chest Pain or Anginal Symptoms -C/w Plavix 75 mg po Daily, Carvedilol 25 mg po BID; Hold Lisinopril 40 mg daily -Troponin peaked at 0.25 but have been flat   Insulin Dependent Diabetes Mellitus Type 2 -C/w Home Lantus 30 units at bedtime -Will add Sensitive Novolog SSI -Consult Diabetic Education  Coordinator -HbA1C was 6.5 -CBG's running from 113-160  Liver Cirrhosis and Associated Ascites / Hepatitis C s/p Harvoni  -LFT's wnL; TBili was elevated 2.3 and now trended down to 0.6 -CT Scan showed liver Cirrhosis -Did not really appreciate Asictes on Exam  Anemia of Chronic Disease -Hb/Hct was 10.8/32.0 on admission; Repeat was 9.0/26.6 -Repeat CMP in AM -Patient's Daugther states he was having frank blood in Urine -Continue to Monitor  Hypothyroidism -TSH was 0.635 and Free T4 was 1.18 -C/w Levothyroxine 88 mcg daily  Thrombocytopenia -Likely from Liver Cirrhosis; Platelet dropped to 84 -Continue to Monitor Closely and repeat CBC in AM  Elevated Troponins -Flat at 0.23, 0.25, 0.24 -Likely from Sepsis -No Anginal Symptoms or complaints, continue to monitor  Hypomagnesemia -Patient's Mag Level was 1.8 -Repeat Mag Level in AM  DVT prophylaxis: SCDs Code Status: FULL CODE Family Communication: No family present at bedside Disposition Plan: Transfer to Medical floor with Telemetry  Consultants:   General Surgery  Urology   Procedures: None  Antimicrobials:  Anti-infectives    Start     Dose/Rate Route Frequency Ordered Stop   09/06/16 1800  levofloxacin (LEVAQUIN) IVPB 750 mg  Status:  Discontinued     750 mg 100 mL/hr over 90 Minutes Intravenous Every 48 hours 09/04/16 1654 09/04/16 1934   09/05/16 2200  ceFEPIme (MAXIPIME) 2 g in dextrose 5 % 50 mL IVPB  Status:  Discontinued     2 g  100 mL/hr over 30 Minutes Intravenous Every 24 hours 09/05/16 0828 09/05/16 1442   09/05/16 2200  cefTRIAXone (ROCEPHIN) 2 g in dextrose 5 % 50 mL IVPB     2 g 100 mL/hr over 30 Minutes Intravenous Every 24 hours 09/05/16 1442     09/05/16 2000  vancomycin (VANCOCIN) IVPB 750 mg/150 ml premix  Status:  Discontinued     750 mg 150 mL/hr over 60 Minutes Intravenous Every 24 hours 09/04/16 1654 09/05/16 0828   09/05/16 2000  vancomycin (VANCOCIN) IVPB 1000 mg/200 mL premix   Status:  Discontinued     1,000 mg 200 mL/hr over 60 Minutes Intravenous Every 24 hours 09/05/16 0828 09/05/16 1442   09/05/16 1445  vancomycin (VANCOCIN) 50 mg/mL oral solution 125 mg     125 mg Oral 4 times daily 09/05/16 1433 09/19/16 1359   09/04/16 2359  aztreonam (AZACTAM) 1 GM IVPB  Status:  Discontinued     1 g 100 mL/hr over 30 Minutes Intravenous Every 8 hours 09/04/16 1654 09/04/16 1934   09/04/16 2359  ceFEPIme (MAXIPIME) 1 g in dextrose 5 % 50 mL IVPB  Status:  Discontinued     1 g 100 mL/hr over 30 Minutes Intravenous Every 24 hours 09/04/16 1943 09/05/16 0828   09/04/16 1930  vancomycin (VANCOCIN) IVPB 1000 mg/200 mL premix     1,000 mg 200 mL/hr over 60 Minutes Intravenous  Once 09/04/16 1911 09/04/16 2106   09/04/16 1545  levofloxacin (LEVAQUIN) IVPB 750 mg     750 mg 100 mL/hr over 90 Minutes Intravenous  Once 09/04/16 1542 09/04/16 1849   09/04/16 1545  aztreonam (AZACTAM) 2 g in dextrose 5 % 50 mL IVPB     2 g 100 mL/hr over 30 Minutes Intravenous  Once 09/04/16 1542 09/04/16 1719   09/04/16 1545  vancomycin (VANCOCIN) IVPB 1000 mg/200 mL premix  Status:  Discontinued     1,000 mg 200 mL/hr over 60 Minutes Intravenous  Once 09/04/16 1542 09/04/16 2106     Subjective: Seen and examined at bedside and stated he had no abdominal pain at all and was doing well. No nausea or vomiting. Thinks diarrhea is slowing down. No CP or SOB.    Objective: Vitals:   09/06/16 0600 09/06/16 0800 09/06/16 1200 09/06/16 1600  BP:  116/69 129/76 (!) 142/77  Pulse: 63 64 60 60  Resp: (!) 23 16 (!) 26 19  Temp:  98.3 F (36.8 C) 97.9 F (36.6 C) 97.8 F (36.6 C)  TempSrc:  Oral Oral Oral  SpO2: 99% 100% 99% 99%  Weight:      Height:        Intake/Output Summary (Last 24 hours) at 09/06/16 1913 Last data filed at 09/06/16 1600  Gross per 24 hour  Intake             2410 ml  Output             1825 ml  Net              585 ml   Filed Weights   09/04/16 1549 09/04/16 2000   Weight: 72.6 kg (160 lb) 70.7 kg (155 lb 13.8 oz)   Examination: Physical Exam:  Constitutional: NAD and appears calm and comfortable sitting bedside Eyes: Lids and conjunctivae normal, sclerae anicteric  ENMT: External Ears, Nose appear normal. Grossly normal hearing. Neck: Appears normal, supple, no cervical masses, normal ROM, no appreciable thyromegaly, no JVD appreciated Respiratory: Diminished to auscultation  bilaterally, no wheezing, rales, rhonchi or crackles. Normal respiratory effort and patient is not tachypenic. No accessory muscle use.  Cardiovascular: RRR, no murmurs / rubs / gallops. S1 and S2 auscultated. No extremity edema.  Abdomen: Soft, nontender, non-distended. No masses palpated. No appreciable hepatosplenomegaly or ascites. Bowel sounds positive x4.  GU: Deferred. Patient had foley cathter in place draining yellow urine Musculoskeletal: No clubbing / cyanosis of digits/nails. No joint deformity upper and lower extremities.  Skin: No rashes, lesions, ulcers. Patient had discoloration of legs and oncomycosis. No induration; Warm and dry.  Neurologic: CN 2-12 grossly intact with no focal deficits. Romberg sign cerebellar reflexes not assessed.  Psychiatric: Normal judgment and insight. Alert and oriented x 3. Normal mood and appropriate affect.   Data Reviewed: I have personally reviewed following labs and imaging studies  CBC:  Recent Labs Lab 09/04/16 1435 09/05/16 0323 09/06/16 0312  WBC 28.8* 14.9* 12.1*  NEUTROABS 26.6* 12.7* 9.9*  HGB 10.8* 9.0* 7.9*  HCT 32.0* 26.6* 23.1*  MCV 74.1* 74.7* 75.2*  PLT 115* 92* 84*   Basic Metabolic Panel:  Recent Labs Lab 09/04/16 1435 09/05/16 0323 09/06/16 0312  NA 131* 132* 130*  K 4.2 4.0 3.7  CL 99* 108 107  CO2 18* 18* 17*  GLUCOSE 278* 170* 111*  BUN 51* 36* 23*  CREATININE 2.66* 1.86* 1.61*  1.59*  CALCIUM 9.2 7.9* 7.7*  MG  --  1.6* 1.8  PHOS  --  1.8* 2.0*   GFR: Estimated Creatinine  Clearance: 45.7 mL/min (A) (by C-G formula based on SCr of 1.59 mg/dL (H)). Liver Function Tests:  Recent Labs Lab 09/04/16 1435 09/05/16 0323 09/06/16 0312  AST 29 17 20   ALT 19 14* 13*  ALKPHOS 64 45 42  BILITOT 2.3* 1.4* 0.6  PROT 9.2* 7.2 6.3*  ALBUMIN 3.5 2.6* 2.3*    Recent Labs Lab 09/04/16 1435  LIPASE 24    Recent Labs Lab 09/04/16 1435  AMMONIA <9*   Coagulation Profile:  Recent Labs Lab 09/04/16 1435 09/05/16 0323  INR 1.67 1.60   Cardiac Enzymes:  Recent Labs Lab 09/04/16 1901 09/05/16 0209 09/05/16 0720  TROPONINI 0.23* 0.25* 0.24*   BNP (last 3 results) No results for input(s): PROBNP in the last 8760 hours. HbA1C:  Recent Labs  09/04/16 2219  HGBA1C 6.5*   CBG:  Recent Labs Lab 09/05/16 1619 09/05/16 2109 09/06/16 0810 09/06/16 1158 09/06/16 1530  GLUCAP 169* 135* 113* 139* 160*   Lipid Profile: No results for input(s): CHOL, HDL, LDLCALC, TRIG, CHOLHDL, LDLDIRECT in the last 72 hours. Thyroid Function Tests:  Recent Labs  09/05/16 0209  TSH 0.635  FREET4 1.18*   Anemia Panel: No results for input(s): VITAMINB12, FOLATE, FERRITIN, TIBC, IRON, RETICCTPCT in the last 72 hours. Sepsis Labs:  Recent Labs Lab 09/04/16 1734 09/04/16 1901 09/04/16 2219 09/05/16 0209 09/06/16 0312  PROCALCITON  --  3.44  --   --  2.53  LATICACIDVEN 2.89* 2.8* 2.4* 1.1  --     Recent Results (from the past 240 hour(s))  Blood Culture (routine x 2)     Status: Abnormal (Preliminary result)   Collection Time: 09/04/16  4:01 PM  Result Value Ref Range Status   Specimen Description BLOOD LEFT HAND  Final   Special Requests IN PEDIATRIC BOTTLE 1 CC  Final   Culture  Setup Time   Final    GRAM NEGATIVE RODS IN PEDIATRIC BOTTLE CRITICAL VALUE NOTED.  VALUE IS CONSISTENT WITH PREVIOUSLY  REPORTED AND CALLED VALUE.    Culture (A)  Final    PROTEUS MIRABILIS SUSCEPTIBILITIES TO FOLLOW Performed at [facility], [place] [address] [place], [place] [address]    Report Status PENDING  Incomplete  Blood Culture (routine x 2)     Status: Abnormal (Preliminary result)   Collection Time: 09/04/16  4:03 PM  Result Value Ref Range Status   Specimen Description BLOOD RIGHT ANTECUBITAL  Final   Special Requests BOTTLES DRAWN AEROBIC AND ANAEROBIC 5 CC EA  Final   Culture  Setup Time   Final    ANAEROBIC BOTTLE ONLY GRAM NEGATIVE RODS CRITICAL RESULT CALLED TO, READ BACK BY AND VERIFIED WITH: PHARMD Renford Dills [id number] 1208PM MLM    Culture (A)  Final    PROTEUS MIRABILIS SUSCEPTIBILITIES TO FOLLOW Performed at [facility], [place] [address] [place], [place] [address]    Report Status PENDING  Incomplete  Blood Culture ID Panel (Reflexed)     Status: Abnormal   Collection Time: 09/04/16  4:03 PM  Result Value Ref Range Status   Enterococcus species NOT DETECTED NOT DETECTED Final   Listeria monocytogenes NOT DETECTED NOT DETECTED Final   Staphylococcus species NOT DETECTED NOT DETECTED Final   Staphylococcus aureus NOT DETECTED NOT DETECTED Final   Streptococcus species NOT DETECTED NOT DETECTED Final   Streptococcus agalactiae NOT DETECTED NOT DETECTED Final   Streptococcus pneumoniae NOT DETECTED NOT DETECTED Final   Streptococcus pyogenes NOT DETECTED NOT DETECTED Final   Acinetobacter baumannii NOT DETECTED NOT DETECTED Final   Enterobacteriaceae species DETECTED (A) NOT DETECTED Final    Comment: Enterobacteriaceae represent a large family of gram-negative bacteria, not a single organism. CRITICAL RESULT CALLED TO, READ BACK BY AND VERIFIED WITH: PHARMD M LILLISON [id number] 1208PM MLM    Enterobacter cloacae complex NOT DETECTED NOT DETECTED Final   Escherichia coli NOT DETECTED NOT DETECTED Final   Klebsiella oxytoca NOT DETECTED NOT DETECTED Final   Klebsiella pneumoniae NOT DETECTED NOT DETECTED Final   Proteus species DETECTED (A) NOT DETECTED Final    Comment: CRITICAL RESULT CALLED TO, READ BACK BY AND  VERIFIED WITH: PHARMD M LILLISON [phone] 1208PM MLM    Serratia marcescens NOT DETECTED NOT DETECTED Final   Carbapenem resistance NOT DETECTED NOT DETECTED Final   Haemophilus influenzae NOT DETECTED NOT DETECTED Final   Neisseria meningitidis NOT DETECTED NOT DETECTED Final   Pseudomonas aeruginosa NOT DETECTED NOT DETECTED Final   Candida albicans NOT DETECTED NOT DETECTED Final   Candida glabrata NOT DETECTED NOT DETECTED Final   Candida krusei NOT DETECTED NOT DETECTED Final   Candida parapsilosis NOT DETECTED NOT DETECTED Final   Candida tropicalis NOT DETECTED NOT DETECTED Final    Comment: Performed at [facility], [place] [address], [place], [place] [address]  MRSA PCR Screening     Status: None   Collection Time: 09/04/16  7:00 PM  Result Value Ref Range Status   MRSA by PCR NEGATIVE NEGATIVE Final    Comment:        The GeneXpert MRSA Assay (FDA approved for NASAL specimens only), is one component of a comprehensive MRSA colonization surveillance program. It is not intended to diagnose MRSA infection nor to guide or monitor treatment for MRSA infections.   Urine culture     Status: None   Collection Time: 09/04/16  9:42 PM  Result Value Ref Range Status   Specimen Description URINE, CATHETERIZED  Final  Special Requests NONE  Final   Culture   Final    NO GROWTH Performed at [facility], [place] [address] [place], [place] [address]    Report Status 09/06/2016 FINAL  Final  C difficile quick scan w PCR reflex     Status: Abnormal   Collection Time: 09/05/16  1:15 PM  Result Value Ref Range Status   C Diff antigen POSITIVE (A) NEGATIVE Final   C Diff toxin POSITIVE (A) NEGATIVE Final   C Diff interpretation Toxin producing C. difficile detected.  Final    Comment: CRITICAL RESULT CALLED TO, READ BACK BY AND VERIFIED WITH: R.JOHNSON RN [phone] A.[email or web address]   Gastrointestinal Panel by PCR , Stool     Status: None   Collection Time: 09/05/16  3:46 PM    Result Value Ref Range Status   Campylobacter species NOT DETECTED NOT DETECTED Final   Plesimonas shigelloides NOT DETECTED NOT DETECTED Final   Salmonella species NOT DETECTED NOT DETECTED Final   Yersinia enterocolitica NOT DETECTED NOT DETECTED Final   Vibrio species NOT DETECTED NOT DETECTED Final   Vibrio cholerae NOT DETECTED NOT DETECTED Final   Enteroaggregative E coli (EAEC) NOT DETECTED NOT DETECTED Final   Enteropathogenic E coli (EPEC) NOT DETECTED NOT DETECTED Final   Enterotoxigenic E coli (ETEC) NOT DETECTED NOT DETECTED Final   Shiga like toxin producing E coli (STEC) NOT DETECTED NOT DETECTED Final   Shigella/Enteroinvasive E coli (EIEC) NOT DETECTED NOT DETECTED Final   Cryptosporidium NOT DETECTED NOT DETECTED Final   Cyclospora cayetanensis NOT DETECTED NOT DETECTED Final   Entamoeba histolytica NOT DETECTED NOT DETECTED Final   Giardia lamblia NOT DETECTED NOT DETECTED Final   Adenovirus F40/41 NOT DETECTED NOT DETECTED Final   Astrovirus NOT DETECTED NOT DETECTED Final   Norovirus GI/GII NOT DETECTED NOT DETECTED Final   Rotavirus A NOT DETECTED NOT DETECTED Final   Sapovirus (I, II, IV, and V) NOT DETECTED NOT DETECTED Final    Radiology Studies: Ct Abdomen Pelvis Wo Contrast  Result Date: 09/05/2016 CLINICAL DATA:  Right ureteral stent 2 weeks ago, sharp abdominal pain intermittent, nausea, vomiting starting yesterday EXAM: CT ABDOMEN AND PELVIS WITHOUT CONTRAST TECHNIQUE: Multidetector CT imaging of the abdomen and pelvis was performed following the standard protocol without IV contrast. COMPARISON:  CT scan 09/04/2016 FINDINGS: Lower chest: Cardiomegaly again noted. Trace right pleural effusion. There is small patchy atelectasis or infiltrate right lower lobe medially. Hepatobiliary: Unenhanced liver shows no biliary ductal dilatation. Again noted nodular liver contour. Cirrhosis cannot be excluded. Status post cholecystectomy. Pancreas: Unenhanced pancreas  without focal abnormality. Spleen: Unenhanced spleen with normal appearance. Adrenals/Urinary Tract: No adrenal gland mass. Again noted right ureteral stent without change in position. No hydronephrosis or hydroureter bilaterally. Again noted nonobstructive calcified calculus in lower pole of the right kidney measures 4 mm. Axial image 39 there is 7 mm calcified obstructive calculus in proximal right ureter just posterior to ureteral stent at the level of lower endplate of L3 vertebral body. There is a Foley catheter within decompressed urinary bladder. Small amount of air within urinary bladder probable post instrumentation. Stomach/Bowel: No small bowel obstruction. No gastric outlet obstruction. No thickened or dilated small bowel loops. Contrast material noted within right colon. There is subtle mild thickening of right colonic wall. Mild adjacent pericolonic stranding. Mild colitis cannot be excluded. Please see axial image 44. No distal colonic obstruction. Contrast material noted in distal sigmoid colon and rectum. Vascular/Lymphatic: Atherosclerotic calcifications of  abdominal aorta and iliac arteries are noted. No aortic aneurysm. No retroperitoneal or mesenteric adenopathy. Reproductive: Prostate gland and seminal vesicles are unremarkable. Other: Small amount of pelvic free fluid noted within posterior pelvis pre rectal region. Musculoskeletal: No destructive bony lesions are noted. There is disc space flattening with vacuum disc phenomenon and minimal anterolisthesis at L5-S1 level. Bilateral pars defect at L5 level. IMPRESSION: 1. Right ureteral stent in place. Right nonobstructive nephrolithiasis stable. 2. There is a 7 mm calcified obstructive calculus in proximal right ureter adjacent to the stent. 3. There is mild thickening of the right colonic wall and small adjacent stranding periods highly suspicious for mild colitis. 4. No small bowel or colonic obstruction. 5. There is a Foley catheter within  decompressed urinary bladder. Small amount of air within bladder is probable post instrumentation. 6. Again noted atherosclerotic calcifications of abdominal aorta and iliac arteries. Next the again noted nodular contour of the liver suspicious for cirrhosis. status post cholecystectomy Electronically Signed   By: Lahoma Crocker M.D.   On: 09/05/2016 11:52   Scheduled Meds: . carvedilol  25 mg Oral BID WC  . cefTRIAXone (ROCEPHIN)  IV  2 g Intravenous Q24H  . cholecalciferol  2,000 Units Oral Daily  . gabapentin  300 mg Oral QHS  . insulin aspart  0-9 Units Subcutaneous TID WC  . insulin glargine  30 Units Subcutaneous QHS  . levothyroxine  88 mcg Oral QAC breakfast  . multivitamin with minerals  1 tablet Oral Daily  . sodium chloride flush  3 mL Intravenous Q12H  . terazosin  2 mg Oral QHS  . vancomycin  125 mg Oral QID   Continuous Infusions: . sodium chloride 100 mL/hr at 09/06/16 0801    LOS: 2 days   Kerney Elbe, DO [facility] Pager [phone]  If 7PM-7AM, please contact night-coverage [email or web address] Password [email or web address] 09/06/2016, 7:13 PM

## 2016-09-06 NOTE — Progress Notes (Signed)
Report given to April, RN and all questions answered. Patient belongings sent with him.

## 2016-09-06 NOTE — Evaluation (Signed)
Physical Therapy Evaluation Patient Details Name: Anthony Mcfarland MRN: [id number] DOB: 20-Jan-1951 Today's Date: 09/06/2016   History of Present Illness  66 yo male admitted with sepsis 2* UTI. Hx of A flutter s/p cardioversion, CAD, Hep C, DM, cirrhosis.   Clinical Impression  On eval, pt required Min assist +2 for mobility. He walked ~100 feet with 2 HHA. Pt c/o knee pain with ambulation. No family present during session. Will follow and progress activity as tolerated.     Follow Up Recommendations Home health PT;Supervision/Assistance - 24 hour    Equipment Recommendations  None recommended by PT    Recommendations for Other Services       Precautions / Restrictions Precautions Precautions: Fall Restrictions Weight Bearing Restrictions: No      Mobility  Bed Mobility Overal bed mobility: Needs Assistance Bed Mobility: Supine to Sit;Sit to Supine     Supine to sit: Min guard;HOB elevated Sit to supine: Min guard;HOB elevated   General bed mobility comments: close guard for safety  Transfers Overall transfer level: Needs assistance Equipment used: Rolling walker (2 wheeled);2 person hand held assist Transfers: Sit to/from Stand Sit to Stand: Min guard;+2 physical assistance;+2 safety/equipment         General transfer comment: Assist to rise, stabilize, control descent. VCs hand placement  Ambulation/Gait Ambulation/Gait assistance: Min assist;+2 physical assistance;+2 safety/equipment Ambulation Distance (Feet): 100 Feet Assistive device: 2 person hand held assist Gait Pattern/deviations: Step-through pattern;Decreased stride length     General Gait Details: Assist to stabilize. Pt c/o knee pain.   Stairs            Wheelchair Mobility    Modified Rankin (Stroke Patients Only)       Balance                                             Pertinent Vitals/Pain Pain Assessment: Faces Faces Pain Scale: Hurts little more Pain  Location: "usual pain"; knees with ambulation Pain Descriptors / Indicators: Aching;Sore Pain Intervention(s): Limited activity within patient's tolerance;Repositioned    Home Living Family/patient expects to be discharged to:: Private residence Living Arrangements: Children;Other relatives (daughter and sister) Available Help at Discharge: Family Type of Home: House Home Access: Stairs to enter Entrance Stairs-Rails: Right Entrance Stairs-Number of Steps: 7   Home Equipment: Cane - single point Additional Comments: PTA pt lived with sister and daughter in one story home with 7 steps to enter and B handrails, toilet riser and shower seat with grab bars in shower; daughter works so pt would be under care of sister during the day and pt and daughter would like to further discuss if sister would be able to provide necessary physical assist     Prior Function           Comments: mod-I with use of cane, per daughter-pt did not use cane with household amb     Hand Dominance   Dominant Hand: Right    Extremity/Trunk Assessment   Upper Extremity Assessment Upper Extremity Assessment: Overall WFL for tasks assessed    Lower Extremity Assessment Lower Extremity Assessment: Generalized weakness    Cervical / Trunk Assessment Cervical / Trunk Assessment: Normal  Communication   Communication: No difficulties  Cognition Arousal/Alertness: Awake/alert Behavior During Therapy: Flat affect Overall Cognitive Status: Within Functional Limits for tasks assessed  General Comments      Exercises     Assessment/Plan    PT Assessment Patient needs continued PT services  PT Problem List Decreased strength;Decreased mobility;Decreased activity tolerance;Decreased balance;Decreased knowledge of use of DME;Pain       PT Treatment Interventions DME instruction;Gait training;Therapeutic activities;Therapeutic  exercise;Patient/family education;Functional mobility training    PT Goals (Current goals can be found in the Care Plan section)  Acute Rehab PT Goals Patient Stated Goal: none stated PT Goal Formulation: With patient Time For Goal Achievement: 09/20/16 Potential to Achieve Goals: Good    Frequency Min 3X/week   Barriers to discharge        Co-evaluation               End of Session Equipment Utilized During Treatment: Gait belt Activity Tolerance: Patient limited by fatigue;Patient limited by pain Patient left: in bed;with call bell/phone within reach;with bed alarm set   PT Visit Diagnosis: Muscle weakness (generalized) (M62.81);Difficulty in walking, not elsewhere classified (R26.2)    Time: 7897-8478 PT Time Calculation (min) (ACUTE ONLY): 15 min   Charges:   PT Evaluation $PT Eval Low Complexity: 1 Procedure     PT G Codes:         Weston Anna, MPT Pager: [phone]

## 2016-09-07 LAB — CBC WITH DIFFERENTIAL/PLATELET
BASOS PCT: 0 %
Basophils Absolute: 0 10*3/uL (ref 0.0–0.1)
EOS ABS: 0.1 10*3/uL (ref 0.0–0.7)
EOS PCT: 1 %
HCT: 23.8 % — ABNORMAL LOW (ref 39.0–52.0)
HEMOGLOBIN: 8.2 g/dL — AB (ref 13.0–17.0)
Lymphocytes Relative: 11 %
Lymphs Abs: 1 10*3/uL (ref 0.7–4.0)
MCH: 25.9 pg — ABNORMAL LOW (ref 26.0–34.0)
MCHC: 34.5 g/dL (ref 30.0–36.0)
MCV: 75.1 fL — ABNORMAL LOW (ref 78.0–100.0)
MONOS PCT: 10 %
Monocytes Absolute: 0.9 10*3/uL (ref 0.1–1.0)
NEUTROS PCT: 79 %
Neutro Abs: 7.4 10*3/uL (ref 1.7–7.7)
PLATELETS: 116 10*3/uL — AB (ref 150–400)
RBC: 3.17 MIL/uL — ABNORMAL LOW (ref 4.22–5.81)
RDW: 17.7 % — ABNORMAL HIGH (ref 11.5–15.5)
WBC: 9.4 10*3/uL (ref 4.0–10.5)

## 2016-09-07 LAB — COMPREHENSIVE METABOLIC PANEL
ALBUMIN: 2.4 g/dL — AB (ref 3.5–5.0)
ALK PHOS: 52 U/L (ref 38–126)
ALT: 13 U/L — ABNORMAL LOW (ref 17–63)
ANION GAP: 5 (ref 5–15)
AST: 24 U/L (ref 15–41)
BUN: 17 mg/dL (ref 6–20)
CALCIUM: 8.1 mg/dL — AB (ref 8.9–10.3)
CHLORIDE: 109 mmol/L (ref 101–111)
CO2: 18 mmol/L — AB (ref 22–32)
Creatinine, Ser: 1.37 mg/dL — ABNORMAL HIGH (ref 0.61–1.24)
GFR calc non Af Amer: 52 mL/min — ABNORMAL LOW (ref >60)
GLUCOSE: 178 mg/dL — AB (ref 65–99)
Potassium: 4 mmol/L (ref 3.5–5.1)
SODIUM: 132 mmol/L — AB (ref 135–145)
Total Bilirubin: 0.5 mg/dL (ref 0.3–1.2)
Total Protein: 6.8 g/dL (ref 6.5–8.1)

## 2016-09-07 LAB — MAGNESIUM: Magnesium: 1.7 mg/dL (ref 1.7–2.4)

## 2016-09-07 LAB — CULTURE, BLOOD (ROUTINE X 2)

## 2016-09-07 LAB — GLUCOSE, CAPILLARY
GLUCOSE-CAPILLARY: 169 mg/dL — AB (ref 65–99)
GLUCOSE-CAPILLARY: 127 mg/dL — AB (ref 65–99)
GLUCOSE-CAPILLARY: 149 mg/dL — AB (ref 65–99)
Glucose-Capillary: 107 mg/dL — ABNORMAL HIGH (ref 65–99)

## 2016-09-07 LAB — PHOSPHORUS: PHOSPHORUS: 2.3 mg/dL — AB (ref 2.5–4.6)

## 2016-09-07 NOTE — Progress Notes (Signed)
PROGRESS NOTE    Anthony Mcfarland  LPF:[id number] DOB: 1950/12/09 DOA: 09/04/2016 PCP: Ronnie Doss, MD   Brief Narrative:  Anthony Mcfarland is a 66 y.o. male with medical history significant of Atrial Flutter s/p Ablation x2 and Cardioversion, CAD, Insulin Dependent Diabetes Mellitus x2, Hepatitis s/p Treatment with Harvoni, Liver Cirrhosis with Ascites, Anemia of Chronic Disease and other comorbid conditions who presented to [facility] with a cc of of Left Sided Abdominal Pain. Patient states he recently had a Right Ureteral Stent 2 weeks ago and subsequently developed sharp abdominal pain that was intermittent with no clear onset. Patient states it felt like a "knife" sometimes and states he had associated Nausea and bilious non-bloody Vomiting since yesterday. Of note patient was recently admitted for Pancolitis and Rectal Bleeding. Patient did have fevers and chills as well. Had no bloody stools but daughter became concerned and noticed that he had gross hematuria so she brought him to the ED for evaluation. [facility] admitted him for sepsis and was found to have a UTI as well as Proteus Bacteremia. I The suspected abscess was not seen on CT Abd/Pelvis with Contrast and there was a mild colitis noted because of mild thickening of the right colonic wall and small adjacent stranding periods. C Difficile was checked and was positive because patient was having significant diarrhea. He was improving and felt better today and complained of no abdominal pain.    Assessment & Plan:   Principal Problem:   Sepsis secondary to UTI [facility]) Active Problems:   Hyperglycemia   Thrombocytopenia (HCC)   Diabetes mellitus type 2, insulin dependent ([place])   Coronary artery disease   Anemia   Cirrhosis (HCC)   Intra-abdominal fluid collection - R flank ? abscess   Bacteremia due to Gram-negative bacteria   Proteus septicemia (HCC)   C. difficile colitis   Hypomagnesemia  Sepsis 2/2 to Pyelonephritis/UTI, Proteus  Bacteremia and C Difficile Colitis, improving -Patient was febrile at 102, Tachycardic at 129, RR of 26, WBC of 28.8 and suspected infection of UTI (though no Urinalysis or Urine Cx was drawn prior to initiation of Abx) because of recent Right Ureteral Stent -Sepsis physiology improved  -Sepsis Protocol Initiated and patient received 2.25 Liters in ED (30 cc/kg) -Unfortunately EDP gave patient Abx before obtaining Urinalysis or Urine Cx -Blood Cx's were also drawn after Abx were initiated; Blood Cx positive for Enterobacterciae Proteus Mirabilis that is Pansensitive but intermediate to Imipenem  -Lactic Acid Level was elevated at 3.73 and trending down to 2.89 and trended down to 1.1 after fluid boluses -Procalcitonin obtained and was 3.44; Repeat was 2.53 -CT Scan of Abd/Pelvis showed 3 mm Non-Obstructing Right Nephrolithiasis and well positioned Right Nephroureteral Stent as well as a Focal Fluid within the Right Upper Quadrant likely extraluminal adjacent to Right Splenic Flexure/Hepatic Flexure, bowel pathology and possible abscess -Repeat CT with po Contrast showed right ureteral stent in place and Right non-obstructive nephrolithiasis stable as well as a 7 mm calcified obstructive calculus in the proximal Right Ureter adjacent to the stent along with mild thickening of the Right Colonic Wall and small adjacent stranding periods highly suspicious for mild colitis.  -**Patient recently had colonoscopy 07/22/16 and showed a 7 mm polyp in the transverse colon and erythematous mucosa in the ascending colon in the cecum that was biopsied and there examination was otherwise normal -General Surgery Consulted by EDP and signed off unless patient develops concerns for perforation or toxic megacolon -Urology also consulted by EDP and  appreciated Recc's -Blood Cx grew out GNR Proteus Mirabilis to IV Ceftriaxone 2 grams q24h -D/C'd Maintenance Fluid with NS at 50 mL/hr -WBC went from 28.8 -> 14.9 -> 12.1 ->  9.4 -PER Nurse patient was having significant amount of Watery Diarrhea; Checked for C. Difficile and patient was positive for C Difficile; C/w Enteric Precautions -C/w Oral Vancomycin for now  -Urinalysis done finally and showed Many Bacteria, Large Blood with TNTC RBC, Large Leukocytes, Negative Nitrites, and TNTC WBC -Urine Cx Showed No Growth -Per Urology Recc's once Cx's are known and he transfers to po, would treat him BID x 14 days and then po Nightly and [facility].   AKI on suspected CKD Stage 3, Improving -Upon Review of Prior Records Patient's Baseline Cr appears to be 1.2-1.4 -Patient's BUN/Cr on Admission is 51/2.66 -> Now down to 17/1.37 -D/C'd IVF with NS at 50 mL/hr -Avoid Nephrotoxic Medications and will hold Furosemide 20 mg po Daily and Lisinopril 40 mg daily -Urology placed foley catheter; Will D/C when change Abx to po -Appreciate Urology Input as patient recently had Right Ureteral Stent and now having Hematuria -Repeat CMP in AM  Hematuria, improved -Patient's Urinalysis showed Large Hgb and TNTC RBC -UDS showed patient was positive for Opiates and Cocaine  -Since Patient recently had Right Ureteral Stent appreciate Urology Evaluation and Recc's -Continue to Monitor with Foley Catheter in place for now  Hx of Atrial Flutter s/p Ablation x2 and Cardioversion currently in Sinus Tachycardia -No longer on Apixaban -C/w Carvedilol 25 mg po BID -Monitor on Telemetry  CAD  -No Chest Pain or Anginal Symptoms -C/w Plavix 75 mg po Daily, Carvedilol 25 mg po BID; Hold Lisinopril 40 mg daily -Troponin peaked at 0.25 but have been flat   Insulin Dependent Diabetes Mellitus Type 2 -C/w Home Lantus 30 units at bedtime -Will add Sensitive Novolog SSI -Consult Diabetic Education Coordinator -HbA1C was 6.5 -CBG's running from 107-169  Liver Cirrhosis and Associated Ascites / Hepatitis C s/p Harvoni  -LFT's wnL; TBili was  elevated 2.3 and now trended down to 0.5 -CT Scan showed liver Cirrhosis -Did not really appreciate Asictes on Exam  Anemia of Chronic Disease -Hb/Hct was 10.8/32.0 on admission; Repeat was 8.2/23.8 -Likely Dilutional Drop as patient is Positive 3.7 Liters -Repeat CMP in AM -Patient's Daugther states he was having frank blood in Urine -Continue to Monitor  Hypothyroidism -TSH was 0.635 and Free T4 was 1.18 -C/w Levothyroxine 88 mcg daily  Thrombocytopenia -Likely from Liver Cirrhosis; Platelets now 116 -Continue to Monitor Closely and repeat CBC in AM  Elevated Troponins -Flat at 0.23, 0.25, 0.24 -Likely from Sepsis -No Anginal Symptoms or complaints, continue to monitor  Hypomagnesemia -Patient's Mag Level was 1.7 -Repeat Mag Level in AM  DVT prophylaxis: SCDs Code Status: FULL CODE Family Communication: No family present at bedside Disposition Plan: Transition to po Antibiotics and get PT evaluation in anticipation of D/C in 24-28 hours  Consultants:   General Surgery  Urology   Procedures: None  Antimicrobials:  Anti-infectives    Start     Dose/Rate Route Frequency Ordered Stop   09/06/16 1800  levofloxacin (LEVAQUIN) IVPB 750 mg  Status:  Discontinued     750 mg 100 mL/hr over 90 Minutes Intravenous Every 48 hours 09/04/16 1654 09/04/16 1934   09/05/16 2200  ceFEPIme (MAXIPIME) 2 g in dextrose 5 % 50 mL IVPB  Status:  Discontinued     2  g 100 mL/hr over 30 Minutes Intravenous Every 24 hours 09/05/16 0828 09/05/16 1442   09/05/16 2200  cefTRIAXone (ROCEPHIN) 2 g in dextrose 5 % 50 mL IVPB     2 g 100 mL/hr over 30 Minutes Intravenous Every 24 hours 09/05/16 1442     09/05/16 2000  vancomycin (VANCOCIN) IVPB 750 mg/150 ml premix  Status:  Discontinued     750 mg 150 mL/hr over 60 Minutes Intravenous Every 24 hours 09/04/16 1654 09/05/16 0828   09/05/16 2000  vancomycin (VANCOCIN) IVPB 1000 mg/200 mL premix  Status:  Discontinued     1,000 mg 200 mL/hr  over 60 Minutes Intravenous Every 24 hours 09/05/16 0828 09/05/16 1442   09/05/16 1445  vancomycin (VANCOCIN) 50 mg/mL oral solution 125 mg     125 mg Oral 4 times daily 09/05/16 1433 09/19/16 1359   09/04/16 2359  aztreonam (AZACTAM) 1 GM IVPB  Status:  Discontinued     1 g 100 mL/hr over 30 Minutes Intravenous Every 8 hours 09/04/16 1654 09/04/16 1934   09/04/16 2359  ceFEPIme (MAXIPIME) 1 g in dextrose 5 % 50 mL IVPB  Status:  Discontinued     1 g 100 mL/hr over 30 Minutes Intravenous Every 24 hours 09/04/16 1943 09/05/16 0828   09/04/16 1930  vancomycin (VANCOCIN) IVPB 1000 mg/200 mL premix     1,000 mg 200 mL/hr over 60 Minutes Intravenous  Once 09/04/16 1911 09/04/16 2106   09/04/16 1545  levofloxacin (LEVAQUIN) IVPB 750 mg     750 mg 100 mL/hr over 90 Minutes Intravenous  Once 09/04/16 1542 09/04/16 1849   09/04/16 1545  aztreonam (AZACTAM) 2 g in dextrose 5 % 50 mL IVPB     2 g 100 mL/hr over 30 Minutes Intravenous  Once 09/04/16 1542 09/04/16 1719   09/04/16 1545  vancomycin (VANCOCIN) IVPB 1000 mg/200 mL premix  Status:  Discontinued     1,000 mg 200 mL/hr over 60 Minutes Intravenous  Once 09/04/16 1542 09/04/16 2106     Subjective: Seen and examined at bedside and stated he had no abdominal pain and had no complaints at all.   Objective: Vitals:   09/06/16 1600 09/06/16 2024 09/07/16 0647 09/07/16 1334  BP: (!) 142/77 137/84 130/81 (!) 143/84  Pulse: 60 64 66 64  Resp: 19 18 18 18   Temp: 97.8 F (36.6 C)  98.4 F (36.9 C) 97.8 F (36.6 C)  TempSrc: Oral Oral Oral Oral  SpO2: 99% 100% 99% 100%  Weight:      Height:        Intake/Output Summary (Last 24 hours) at 09/07/16 1937 Last data filed at 09/07/16 1835  Gross per 24 hour  Intake          2981.67 ml  Output             2275 ml  Net           706.67 ml   Filed Weights   09/04/16 1549 09/04/16 2000  Weight: 72.6 kg (160 lb) 70.7 kg (155 lb 13.8 oz)   Examination: Physical Exam:  Constitutional: NAD  and appears calm and comfortable laying in bed Eyes: Lids and conjunctivae normal, sclerae anicteric  ENMT: External Ears, Nose appear normal. Grossly normal hearing. Neck: Appears normal, supple, no cervical masses, normal ROM, no appreciable thyromegaly, no JVD appreciated Respiratory: Diminished to auscultation bilaterally, no wheezing, rales, rhonchi or crackles. Normal respiratory effort and patient is not tachypenic. No accessory muscle use.  Cardiovascular: RRR, no murmurs / rubs / gallops. S1 and S2 auscultated. No extremity edema.  Abdomen: Soft, nontender, non-distended. No masses palpated. No appreciable hepatosplenomegaly or ascites. Bowel sounds positive x4.  GU: Deferred. Patient had foley cathter in place draining yellow urine Musculoskeletal: No clubbing / cyanosis of digits/nails. No joint deformity upper and lower extremities.  Skin: No rashes, lesions, ulcers. Patient had discoloration of legs and oncomycosis. No induration; Warm and dry.  Neurologic: CN 2-12 grossly intact with no focal deficits. Romberg sign cerebellar reflexes not assessed.  Psychiatric: Normal judgment and insight. Alert and oriented x 3. Normal mood and appropriate affect.   Data Reviewed: I have personally reviewed following labs and imaging studies  CBC:  Recent Labs Lab 09/04/16 1435 09/05/16 0323 09/06/16 0312 09/07/16 0517  WBC 28.8* 14.9* 12.1* 9.4  NEUTROABS 26.6* 12.7* 9.9* 7.4  HGB 10.8* 9.0* 7.9* 8.2*  HCT 32.0* 26.6* 23.1* 23.8*  MCV 74.1* 74.7* 75.2* 75.1*  PLT 115* 92* 84* 937*   Basic Metabolic Panel:  Recent Labs Lab 09/04/16 1435 09/05/16 0323 09/06/16 0312 09/07/16 0517  NA 131* 132* 130* 132*  K 4.2 4.0 3.7 4.0  CL 99* 108 107 109  CO2 18* 18* 17* 18*  GLUCOSE 278* 170* 111* 178*  BUN 51* 36* 23* 17  CREATININE 2.66* 1.86* 1.61*  1.59* 1.37*  CALCIUM 9.2 7.9* 7.7* 8.1*  MG  --  1.6* 1.8 1.7  PHOS  --  1.8* 2.0* 2.3*   GFR: Estimated Creatinine Clearance: 53  mL/min (A) (by C-G formula based on SCr of 1.37 mg/dL (H)). Liver Function Tests:  Recent Labs Lab 09/04/16 1435 09/05/16 0323 09/06/16 0312 09/07/16 0517  AST 29 17 20 24   ALT 19 14* 13* 13*  ALKPHOS 64 45 42 52  BILITOT 2.3* 1.4* 0.6 0.5  PROT 9.2* 7.2 6.3* 6.8  ALBUMIN 3.5 2.6* 2.3* 2.4*    Recent Labs Lab 09/04/16 1435  LIPASE 24    Recent Labs Lab 09/04/16 1435  AMMONIA <9*   Coagulation Profile:  Recent Labs Lab 09/04/16 1435 09/05/16 0323  INR 1.67 1.60   Cardiac Enzymes:  Recent Labs Lab 09/04/16 1901 09/05/16 0209 09/05/16 0720  TROPONINI 0.23* 0.25* 0.24*   BNP (last 3 results) No results for input(s): PROBNP in the last 8760 hours. HbA1C:  Recent Labs  09/04/16 2219  HGBA1C 6.5*   CBG:  Recent Labs Lab 09/06/16 1530 09/06/16 2135 09/07/16 0725 09/07/16 1111 09/07/16 1706  GLUCAP 160* 156* 169* 127* 107*   Lipid Profile: No results for input(s): CHOL, HDL, LDLCALC, TRIG, CHOLHDL, LDLDIRECT in the last 72 hours. Thyroid Function Tests:  Recent Labs  09/05/16 0209  TSH 0.635  FREET4 1.18*   Anemia Panel: No results for input(s): VITAMINB12, FOLATE, FERRITIN, TIBC, IRON, RETICCTPCT in the last 72 hours. Sepsis Labs:  Recent Labs Lab 09/04/16 1734 09/04/16 1901 09/04/16 2219 09/05/16 0209 09/06/16 0312  PROCALCITON  --  3.44  --   --  2.53  LATICACIDVEN 2.89* 2.8* 2.4* 1.1  --     Recent Results (from the past 240 hour(s))  Blood Culture (routine x 2)     Status: Abnormal   Collection Time: 09/04/16  4:01 PM  Result Value Ref Range Status   Specimen Description BLOOD LEFT HAND  Final   Special Requests IN PEDIATRIC BOTTLE 1 CC  Final   Culture  Setup Time   Final    GRAM NEGATIVE RODS IN PEDIATRIC BOTTLE CRITICAL VALUE NOTED.  VALUE IS CONSISTENT WITH PREVIOUSLY REPORTED AND CALLED VALUE.    Culture (A)  Final    PROTEUS MIRABILIS SUSCEPTIBILITIES PERFORMED ON PREVIOUS CULTURE WITHIN THE LAST 5  DAYS. Performed at [facility], [place] [address], [place], [place] [address]    Report Status 09/07/2016 FINAL  Final  Blood Culture (routine x 2)     Status: Abnormal   Collection Time: 09/04/16  4:03 PM  Result Value Ref Range Status   Specimen Description BLOOD RIGHT ANTECUBITAL  Final   Special Requests BOTTLES DRAWN AEROBIC AND ANAEROBIC 5 CC EA  Final   Culture  Setup Time   Final    ANAEROBIC BOTTLE ONLY GRAM NEGATIVE RODS CRITICAL RESULT CALLED TO, READ BACK BY AND VERIFIED WITH: Stefan Church [id number] MLM Performed at [facility], [place] [address] [place],  [address]    Culture PROTEUS MIRABILIS (A)  Final   Report Status 09/07/2016 FINAL  Final   Organism ID, Bacteria PROTEUS MIRABILIS  Final      Susceptibility   Proteus mirabilis - MIC*    AMPICILLIN <=2 SENSITIVE Sensitive     CEFAZOLIN <=4 SENSITIVE Sensitive     CEFEPIME <=1 SENSITIVE Sensitive     CEFTAZIDIME <=1 SENSITIVE Sensitive     CEFTRIAXONE <=1 SENSITIVE Sensitive     CIPROFLOXACIN <=0.25 SENSITIVE Sensitive     GENTAMICIN <=1 SENSITIVE Sensitive     IMIPENEM 8 INTERMEDIATE Intermediate     TRIMETH/SULFA <=20 SENSITIVE Sensitive     AMPICILLIN/SULBACTAM <=2 SENSITIVE Sensitive     PIP/TAZO <=4 SENSITIVE Sensitive     * PROTEUS MIRABILIS  Blood Culture ID Panel (Reflexed)     Status: Abnormal   Collection Time: 09/04/16  4:03 PM  Result Value Ref Range Status   Enterococcus species NOT DETECTED NOT DETECTED Final   Listeria monocytogenes NOT DETECTED NOT DETECTED Final   Staphylococcus species NOT DETECTED NOT DETECTED Final   Staphylococcus aureus NOT DETECTED NOT DETECTED Final   Streptococcus species NOT DETECTED NOT DETECTED Final   Streptococcus agalactiae NOT DETECTED NOT DETECTED Final   Streptococcus pneumoniae NOT DETECTED NOT DETECTED Final   Streptococcus pyogenes NOT DETECTED NOT DETECTED Final   Acinetobacter baumannii NOT DETECTED NOT DETECTED Final    Enterobacteriaceae species DETECTED (A) NOT DETECTED Final    Comment: Enterobacteriaceae represent a large family of gram-negative bacteria, not a single organism. CRITICAL RESULT CALLED TO, READ BACK BY AND VERIFIED WITH: PHARMD M LILLISON [id number] MLM    Enterobacter cloacae complex NOT DETECTED NOT DETECTED Final   Escherichia coli NOT DETECTED NOT DETECTED Final   Klebsiella oxytoca NOT DETECTED NOT DETECTED Final   Klebsiella pneumoniae NOT DETECTED NOT DETECTED Final   Proteus species DETECTED (A) NOT DETECTED Final    Comment: CRITICAL RESULT CALLED TO, READ BACK BY AND VERIFIED WITH: PHARMD M LILLISON [id number] MLM    Serratia marcescens NOT DETECTED NOT DETECTED Final   Carbapenem resistance NOT DETECTED NOT DETECTED Final   Haemophilus influenzae NOT DETECTED NOT DETECTED Final   Neisseria meningitidis NOT DETECTED NOT DETECTED Final   Pseudomonas aeruginosa NOT DETECTED NOT DETECTED Final   Candida albicans NOT DETECTED NOT DETECTED Final   Candida glabrata NOT DETECTED NOT DETECTED Final   Candida krusei NOT DETECTED NOT DETECTED Final   Candida parapsilosis NOT DETECTED NOT DETECTED Final   Candida tropicalis NOT DETECTED NOT DETECTED Final    Comment: Performed at [facility], [address], [place], [place] [address]  MRSA PCR Screening     Status: None   Collection Time: 09/04/16  7:00 PM  Result Value Ref Range Status   MRSA by PCR NEGATIVE NEGATIVE Final    Comment:        The GeneXpert MRSA Assay (FDA approved for NASAL specimens only), is one component of a comprehensive MRSA colonization surveillance program. It is not intended to diagnose MRSA infection nor to guide or monitor treatment for MRSA infections.   Urine culture     Status: None   Collection Time: 09/04/16  9:42 PM  Result Value Ref Range Status   Specimen Description URINE, CATHETERIZED  Final   Special Requests NONE  Final   Culture   Final    NO GROWTH Performed at  [facility], [address], [place], [place] [address]    Report Status 09/06/2016 FINAL  Final  C difficile quick scan w PCR reflex     Status: Abnormal   Collection Time: 09/05/16  1:15 PM  Result Value Ref Range Status   C Diff antigen POSITIVE (A) NEGATIVE Final   C Diff toxin POSITIVE (A) NEGATIVE Final   C Diff interpretation Toxin producing C. difficile detected.  Final    Comment: CRITICAL RESULT CALLED TO, READ BACK BY AND VERIFIED WITH: R.JOHNSON RN [phone] A.[email or web address]   Gastrointestinal Panel by PCR , Stool     Status: None   Collection Time: 09/05/16  3:46 PM  Result Value Ref Range Status   Campylobacter species NOT DETECTED NOT DETECTED Final   Plesimonas shigelloides NOT DETECTED NOT DETECTED Final   Salmonella species NOT DETECTED NOT DETECTED Final   Yersinia enterocolitica NOT DETECTED NOT DETECTED Final   Vibrio species NOT DETECTED NOT DETECTED Final   Vibrio cholerae NOT DETECTED NOT DETECTED Final   Enteroaggregative E coli (EAEC) NOT DETECTED NOT DETECTED Final   Enteropathogenic E coli (EPEC) NOT DETECTED NOT DETECTED Final   Enterotoxigenic E coli (ETEC) NOT DETECTED NOT DETECTED Final   Shiga like toxin producing E coli (STEC) NOT DETECTED NOT DETECTED Final   Shigella/Enteroinvasive E coli (EIEC) NOT DETECTED NOT DETECTED Final   Cryptosporidium NOT DETECTED NOT DETECTED Final   Cyclospora cayetanensis NOT DETECTED NOT DETECTED Final   Entamoeba histolytica NOT DETECTED NOT DETECTED Final   Giardia lamblia NOT DETECTED NOT DETECTED Final   Adenovirus F40/41 NOT DETECTED NOT DETECTED Final   Astrovirus NOT DETECTED NOT DETECTED Final   Norovirus GI/GII NOT DETECTED NOT DETECTED Final   Rotavirus A NOT DETECTED NOT DETECTED Final   Sapovirus (I, II, IV, and V) NOT DETECTED NOT DETECTED Final    Radiology Studies: No results found. Scheduled Meds: . carvedilol  25 mg Oral BID WC  . cefTRIAXone (ROCEPHIN)  IV  2 g Intravenous Q24H  .  cholecalciferol  2,000 Units Oral Daily  . gabapentin  300 mg Oral QHS  . insulin aspart  0-9 Units Subcutaneous TID WC  . insulin glargine  30 Units Subcutaneous QHS  . levothyroxine  88 mcg Oral QAC breakfast  . multivitamin with minerals  1 tablet Oral Daily  . sodium chloride flush  3 mL Intravenous Q12H  . terazosin  2 mg Oral QHS  . vancomycin  125 mg Oral QID   Continuous Infusions: . sodium chloride 100 mL/hr at 09/07/16 1334    LOS: 3 days   Kerney Elbe, DO [facility] Pager [phone]  If 7PM-7AM,  please contact night-coverage [email or web address] Password [facility] 09/07/2016, 7:37 PM

## 2016-09-08 DIAGNOSIS — N10 Acute tubulo-interstitial nephritis: Secondary | ICD-10-CM

## 2016-09-08 LAB — COMPREHENSIVE METABOLIC PANEL
ALBUMIN: 2.4 g/dL — AB (ref 3.5–5.0)
ALT: 13 U/L — ABNORMAL LOW (ref 17–63)
ANION GAP: 7 (ref 5–15)
AST: 20 U/L (ref 15–41)
Alkaline Phosphatase: 43 U/L (ref 38–126)
BUN: 12 mg/dL (ref 6–20)
CHLORIDE: 107 mmol/L (ref 101–111)
CO2: 20 mmol/L — AB (ref 22–32)
Calcium: 8.2 mg/dL — ABNORMAL LOW (ref 8.9–10.3)
Creatinine, Ser: 1.24 mg/dL (ref 0.61–1.24)
GFR calc Af Amer: >60 mL/min (ref >60)
GFR calc non Af Amer: 59 mL/min — ABNORMAL LOW (ref >60)
GLUCOSE: 117 mg/dL — AB (ref 65–99)
POTASSIUM: 3.5 mmol/L (ref 3.5–5.1)
SODIUM: 134 mmol/L — AB (ref 135–145)
Total Bilirubin: 0.6 mg/dL (ref 0.3–1.2)
Total Protein: 6.6 g/dL (ref 6.5–8.1)

## 2016-09-08 LAB — GLUCOSE, CAPILLARY
GLUCOSE-CAPILLARY: 166 mg/dL — AB (ref 65–99)
GLUCOSE-CAPILLARY: 106 mg/dL — AB (ref 65–99)
Glucose-Capillary: 86 mg/dL (ref 65–99)

## 2016-09-08 LAB — CBC WITH DIFFERENTIAL/PLATELET
BASOS PCT: 0 %
Basophils Absolute: 0 10*3/uL (ref 0.0–0.1)
EOS ABS: 0.1 10*3/uL (ref 0.0–0.7)
Eosinophils Relative: 1 %
HCT: 26.1 % — ABNORMAL LOW (ref 39.0–52.0)
Hemoglobin: 8.9 g/dL — ABNORMAL LOW (ref 13.0–17.0)
Lymphocytes Relative: 13 %
Lymphs Abs: 1.1 10*3/uL (ref 0.7–4.0)
MCH: 25.6 pg — ABNORMAL LOW (ref 26.0–34.0)
MCHC: 34.1 g/dL (ref 30.0–36.0)
MCV: 75 fL — ABNORMAL LOW (ref 78.0–100.0)
MONO ABS: 0.9 10*3/uL (ref 0.1–1.0)
MONOS PCT: 11 %
NEUTROS PCT: 75 %
Neutro Abs: 6.4 10*3/uL (ref 1.7–7.7)
Platelets: 102 10*3/uL — ABNORMAL LOW (ref 150–400)
RBC: 3.48 MIL/uL — ABNORMAL LOW (ref 4.22–5.81)
RDW: 17.8 % — AB (ref 11.5–15.5)
WBC: 8.5 10*3/uL (ref 4.0–10.5)

## 2016-09-08 LAB — PHOSPHORUS: Phosphorus: 2.7 mg/dL (ref 2.5–4.6)

## 2016-09-08 LAB — MAGNESIUM: Magnesium: 1.6 mg/dL — ABNORMAL LOW (ref 1.7–2.4)

## 2016-09-08 LAB — PROCALCITONIN: Procalcitonin: 0.45 ng/mL

## 2016-09-08 MED ORDER — SULFAMETHOXAZOLE-TRIMETHOPRIM 800-160 MG PO TABS: 1.0000 | ORAL_TABLET | Freq: Two times a day (BID) | ORAL | 0 refills | Status: DC

## 2016-09-08 MED ORDER — MAGNESIUM SULFATE 2 GM/50ML IV SOLN
2.0000 g | Freq: Once | INTRAVENOUS | Status: AC
  Administered 2016-09-08: 2 g via INTRAVENOUS
  Filled 2016-09-08: qty 50

## 2016-09-08 MED ORDER — DEXTROSE 5 % IV SOLN
2.0000 g | Freq: Once | INTRAVENOUS | Status: AC
  Administered 2016-09-08: 2 g via INTRAVENOUS
  Filled 2016-09-08: qty 2

## 2016-09-08 MED ORDER — ADULT MULTIVITAMIN W/MINERALS CH: 1.0000 | ORAL_TABLET | Freq: Every day | ORAL | 0 refills | Status: AC

## 2016-09-08 MED ORDER — VANCOMYCIN 50 MG/ML ORAL SOLUTION: 125.0000 mg | Freq: Four times a day (QID) | ORAL | 0 refills | Status: AC

## 2016-09-08 MED ORDER — SULFAMETHOXAZOLE-TRIMETHOPRIM 800-160 MG PO TABS
1.0000 | ORAL_TABLET | Freq: Two times a day (BID) | ORAL | Status: DC
  Administered 2016-09-08 (×2): 1 via ORAL
  Filled 2016-09-08 (×2): qty 1

## 2016-09-08 NOTE — Progress Notes (Signed)
Patient discharged home with daughter. All belongings sent home. Discharge paperwork discussed and all questions answered. Patient left unit via wheelchair in no distress.

## 2016-09-08 NOTE — Discharge Summary (Signed)
Physician Discharge Summary  Anthony Mcfarland YCX:[id number] DOB: [birth year] DOA: 09/04/2016  PCP: Ronnie Doss, MD  Admit date: 09/04/2016 Discharge date: 09/08/2016  Admitted From: Home Disposition: Home with Home Health  Recommendations for Outpatient Follow-up:  1. Follow up with PCP in 1-2 weeks 2. Follow up with Urology as an outpatient and at the [facility] for [facility] Procedure October 06, 2016 3. Follow up with Gastroenterology as an outpatient for Liver Cirrhosis and repeat Colonoscopy 4. Please obtain CMP/CBC in one week 5. Please follow up on the following pending results: Repeat Blood Cultures  Home Health: YES Equipment/Devices: Conservation officer, nature with 5" Wheels  Discharge Condition: Stable CODE STATUS: FULL CODE Diet recommendation: Heart Healthy / Carb Modified  Brief/Interim Summary: Anthony Mcfarland a 66 y.o.malewith medical history significant of Atrial Flutter s/p Ablation x2 and Cardioversion, CAD, Insulin Dependent Diabetes Mellitus x2, Hepatitis s/p Treatment with Harvoni, Liver Cirrhosis with Ascites, Anemia of Chronic Disease and other comorbid conditions who presented to [facility] with a cc of of Left Sided Abdominal Pain. Patient states he recently had a Right Ureteral Stent 2 weeks ago and subsequently developed sharp abdominal pain that was intermittent with no clear onset. Patient states it felt like a "knife" sometimes and states he had associated Nausea and bilious non-bloody Vomiting since yesterday. Of note patient was recently admitted for Pancolitis and Rectal Bleeding. Patient did have fevers and chills as well. Had no bloody stools but daughter became concerned and noticed that he had gross hematuria so she brought him to the ED for evaluation. [facility] admitted him for sepsis and was found to have a UTI as well as Proteus Bacteremia. I The suspected abscess was not seen on CT Abd/Pelvis with Contrast and there was a mild colitis noted because of mild thickening of the right  colonic wall and small adjacent stranding periods. C Difficile was checked and was positive because patient was having significant diarrhea. He improved significantly and diarrhea started slowing down and patient denied any more abdominal pain. He was deemed medically stable to D/C Home and after discussion with Infectious Diseases Dr. Megan Salon patient was transitioned to po Abx. He will need to follow up with PCP and Urology as an outpatient.   Discharge Diagnoses:  Principal Problem:   Sepsis secondary to UTI [facility]) Active Problems:   Hyperglycemia   Thrombocytopenia (HCC)   Diabetes mellitus type 2, insulin dependent ([place])   Coronary artery disease   Anemia   Cirrhosis (HCC)   Intra-abdominal fluid collection - R flank ? abscess   Bacteremia due to Gram-negative bacteria   Proteus septicemia (HCC)   C. difficile colitis   Hypomagnesemia  Sepsis 2/2 to Pyelonephritis/UTI, Proteus Bacteremia and C Difficile Colitis, improved -Patient was febrile at 102, Tachycardic at 129, RR of 26, WBC of 28.8 and suspected infection of UTI (though no Urinalysis or Urine Cx was drawn prior to initiation of Abx) because of recent Right Ureteral Stent -Sepsis physiology improved and resolved -Sepsis Protocol Initiated and patient received 2.25 Liters in ED (30 cc/kg) -Unfortunately EDP gave patient Abx before obtaining Urinalysis or Urine Cx -Blood Cx's were also drawn after Abx were initiated; Blood Cx positive for Enterobacterciae Proteus Mirabilis that is Pansensitive but intermediate to Imipenem  -Lactic Acid Level was elevated at 3.73 and trending down to 2.89 and trended down to 1.1 after fluid boluses -Procalcitonin obtained and was 3.44; Repeat was 2.53 and trended down to 0.45 -CT Scan of Abd/Pelvis showed 3 mm Non-Obstructing Right Nephrolithiasis and  well positioned Right Nephroureteral Stent as well as a Focal Fluid within the Right Upper Quadrant likely extraluminal adjacent to Right Splenic  Flexure/Hepatic Flexure, bowel pathology and possible abscess -Repeat CT with po Contrast showed right ureteral stent in place and Right non-obstructive nephrolithiasis stable as well as a 7 mm calcified obstructive calculus in the proximal Right Ureter adjacent to the stent along with mild thickening of the Right Colonic Wall and small adjacent stranding periods highly suspicious for mild colitis.  -**Patient recently had colonoscopy 07/22/16 and showed a 7 mm polyp in the transverse colon and erythematous mucosa in the ascending colon in the cecum that was biopsied and there examination was otherwise normal -General Surgery Consulted by EDP and signed off unless patient develops concerns for perforation or toxic megacolon -Urology also consulted by EDP and appreciated Recc's -Blood Cx grew out GNR Proteus Mirabilis to IV Ceftriaxone 2 grams q24h -> changed to Bactrim DS for 5 more days for total of 10 days -Repeat Blood Cultures -D/C'd Maintenance Fluid with NS at 50 mL/hr -WBC went from 28.8 -> 14.9 -> 12.1 -> 9.4 -> 8.5 -PER Nurse patient was having significant amount of Watery Diarrhea; Checked for C. Difficile and patient was positive for C Difficile; C/w Enteric Precautions -C/w Oral Vancomycin for total of 14 days and 7 days after completion of Bactrim D/S  -Urinalysis done finally and showed Many Bacteria, Large Blood with TNTC RBC, Large Leukocytes, Negative Nitrites, and TNTC WBC -Urine Cx Showed No Growth -Per Urology Recc's once Cx's are known and he transfers to po, would treat him BID x 14 days and then po Nightly and [facility] however per ID patient will need shortened course of Abx given C.Diff and would only treat Bacteremia for 10 days.  -Discussed case with Dr. Megan Salon of Infectious Diseases and appreciated his recommendations -[facility]  AKI on suspected CKD Stage 3, Improving -Upon  Review of Prior Records Patient's Baseline Cr appears to be 1.2-1.4 -Patient's BUN/Cr on Admission is 51/2.66 -> Now down to 12/1.24 -D/C'd IVF with NS at 50 mL/hr -Resume Furosemide 20 mg po Daily and Lisinopril 40 mg daily -Urology placed foley catheter; D/C as patient has been changed Abx to po -Appreciate Urology Input as patient recently had Right Ureteral Stent  -Repeat CMP as an outpatient -Follow up with Urology as an outpatient  Hematuria, improved -Patient's Urinalysis showed Large Hgb and TNTC RBC -UDS showed patient was positive for Opiates and Cocaine  -Since Patient recently had Right Ureteral Stent appreciate Urology Evaluation and Recc's -D/C Foley Catheter   Hx of Atrial Flutter s/p Ablation x2 and Cardioversion currently in Sinus Tachycardia -No longer on Apixaban -C/w Carvedilol 25 mg po BID -Monitored on Telemetry  CAD  -No Chest Pain or Anginal Symptoms -C/w Plavix 75 mg po Daily, Carvedilol 25 mg po BID; Held Lisinopril 40 mg daily but ok to restart -Troponin peaked at 0.25 but have been flat  Insulin Dependent Diabetes Mellitus Type 2 -C/w Home Lantus 30 units at bedtime -HbA1C was 6.5 -CBG's running from 106-166  Liver Cirrhosis and Associated Ascites / Hepatitis C s/p Harvoni -LFT's wnL; TBili was elevated 2.3 and now trended down to 0.6 -CT Scan showed liver Cirrhosis -Did not really appreciate Asictes on Exam -Follow up with Gastroenterology as an outpatient  -Will also need repeat Colonoscopy as an outpatient   Anemia of  Chronic Disease -Hb/Hct was 10.8/32.0 on admission; Repeat was 8.9/26.1 -Likely Dilutional Drop as patient is Positive 2.5 Liters -Repeat CBC as an outpatient  -Continue to Monitor  Hypothyroidism -TSH was 0.635 and Free T4 was 1.18 -C/w Levothyroxine 88 mcg daily  Thrombocytopenia -Likely from Liver Cirrhosis; Platelets now 102 -Continue to Monitor Closely and repeat CBC as an outpatient  Elevated  Troponins -Flat at 0.23, 0.25, 0.24 -Likely from Sepsis -No Anginal Symptoms or complaints, continue to monitor  Hypomagnesemia -Patient's Mag Level was 1.6 -Replete Prior to D/C -Repeat Mag as an outpatient   Hypertension -C/w Amlodipine 10 mg po Daily, Carvedilol 25 mg po BID, Restart Furosemide 10 mg qHS and Lisinopril 40 mg po daily  Discharge Instructions   Allergies as of 09/08/2016      Reactions   Grapefruit Extract Other (See Comments)   [facility] told him not to eat   Amoxicillin Other (See Comments)   Unknown reaction - can tolerate Cephalosporins Has patient had a PCN reaction causing immediate rash, facial/tongue/throat swelling, SOB or lightheadedness with hypotension: unknown Has patient had a PCN reaction causing severe rash involving mucus membranes or skin necrosis: unknown Has patient had a PCN reaction that required hospitalization: unknown Has patient had a PCN reaction occurring within the last 10 years: unknown If all of the above answers are "NO", then may proceed with Cephalosporin      Medication List    TAKE these medications   acetaminophen 325 MG tablet Commonly known as:  TYLENOL Take 650 mg by mouth every 6 (six) hours as needed for moderate pain.   albuterol 108 (90 Base) MCG/ACT inhaler Commonly known as:  PROVENTIL HFA;VENTOLIN HFA Inhale 2 puffs into the lungs every 6 (six) hours as needed for wheezing or shortness of breath.   amLODipine 10 MG tablet Commonly known as:  NORVASC Take 10 mg by mouth daily.   carvedilol 25 MG tablet Commonly known as:  COREG Take 25 mg by mouth 2 (two) times daily with a meal.   cholecalciferol 1000 units tablet Commonly known as:  VITAMIN D Take 2,000 Units by mouth daily.   clopidogrel 75 MG tablet Commonly known as:  PLAVIX Take 1 tablet (75 mg total) by mouth daily.   furosemide 20 MG tablet Commonly known as:  LASIX Take 10 mg by mouth every evening.   gabapentin 300 MG capsule Commonly  known as:  NEURONTIN Take 1 capsule (300 mg total) by mouth at bedtime. What changed:  how much to take  when to take this   insulin glargine 100 UNIT/ML injection Commonly known as:  LANTUS Inject 0.3 mLs (30 Units total) into the skin at bedtime.   levothyroxine 88 MCG tablet Commonly known as:  SYNTHROID, LEVOTHROID Take 88 mcg by mouth daily before breakfast.   lisinopril 40 MG tablet Commonly known as:  PRINIVIL,ZESTRIL Take 40 mg by mouth daily.   multivitamin with minerals Tabs tablet Take 1 tablet by mouth daily. Start taking on:  09/09/2016   sulfamethoxazole-trimethoprim 800-160 MG tablet Commonly known as:  BACTRIM DS,SEPTRA DS Take 1 tablet by mouth every 12 (twelve) hours. Start taking on:  09/09/2016   terazosin 2 MG capsule Commonly known as:  HYTRIN Take 2 mg by mouth at bedtime.   vancomycin 50 mg/mL oral solution Commonly known as:  VANCOCIN Take 2.5 mLs (125 mg total) by mouth 4 (four) times daily.            Durable Medical Equipment  Start     Ordered   09/08/16 1232  For home use only DME Walker rolling  Once    Question:  Patient needs a walker to treat with the following condition  Answer:  Physical deconditioning   09/08/16 1231     Follow-up Information    [facility]. Call.   Specialty:  General Practice Why:  Urology Office for f/u to address stent/stone  Contact information: [place] [address] [phone]          Allergies  Allergen Reactions  . Grapefruit Extract Other (See Comments)    [facility] told him not to eat  . Amoxicillin Other (See Comments)    Unknown reaction - can tolerate Cephalosporins Has patient had a PCN reaction causing immediate rash, facial/tongue/throat swelling, SOB or lightheadedness with hypotension: unknown Has patient had a PCN reaction causing severe rash involving mucus membranes or skin necrosis: unknown Has patient had a PCN reaction that required  hospitalization: unknown Has patient had a PCN reaction occurring within the last 10 years: unknown If all of the above answers are "NO", then may proceed with Cephalosporin   Consultations:  Discussed with Infectious Diseases Dr. Megan Salon over the phone   General Surgery  Urology  Procedures/Studies: Ct Abdomen Pelvis Wo Contrast  Result Date: 09/05/2016 CLINICAL DATA:  Right ureteral stent 2 weeks ago, sharp abdominal pain intermittent, nausea, vomiting starting yesterday EXAM: CT ABDOMEN AND PELVIS WITHOUT CONTRAST TECHNIQUE: Multidetector CT imaging of the abdomen and pelvis was performed following the standard protocol without IV contrast. COMPARISON:  CT scan 09/04/2016 FINDINGS: Lower chest: Cardiomegaly again noted. Trace right pleural effusion. There is small patchy atelectasis or infiltrate right lower lobe medially. Hepatobiliary: Unenhanced liver shows no biliary ductal dilatation. Again noted nodular liver contour. Cirrhosis cannot be excluded. Status post cholecystectomy. Pancreas: Unenhanced pancreas without focal abnormality. Spleen: Unenhanced spleen with normal appearance. Adrenals/Urinary Tract: No adrenal gland mass. Again noted right ureteral stent without change in position. No hydronephrosis or hydroureter bilaterally. Again noted nonobstructive calcified calculus in lower pole of the right kidney measures 4 mm. Axial image 39 there is 7 mm calcified obstructive calculus in proximal right ureter just posterior to ureteral stent at the level of lower endplate of L3 vertebral body. There is a Foley catheter within decompressed urinary bladder. Small amount of air within urinary bladder probable post instrumentation. Stomach/Bowel: No small bowel obstruction. No gastric outlet obstruction. No thickened or dilated small bowel loops. Contrast material noted within right colon. There is subtle mild thickening of right colonic wall. Mild adjacent pericolonic stranding. Mild colitis  cannot be excluded. Please see axial image 44. No distal colonic obstruction. Contrast material noted in distal sigmoid colon and rectum. Vascular/Lymphatic: Atherosclerotic calcifications of abdominal aorta and iliac arteries are noted. No aortic aneurysm. No retroperitoneal or mesenteric adenopathy. Reproductive: Prostate gland and seminal vesicles are unremarkable. Other: Small amount of pelvic free fluid noted within posterior pelvis pre rectal region. Musculoskeletal: No destructive bony lesions are noted. There is disc space flattening with vacuum disc phenomenon and minimal anterolisthesis at L5-S1 level. Bilateral pars defect at L5 level. IMPRESSION: 1. Right ureteral stent in place. Right nonobstructive nephrolithiasis stable. 2. There is a 7 mm calcified obstructive calculus in proximal right ureter adjacent to the stent. 3. There is mild thickening of the right colonic wall and small adjacent stranding periods highly suspicious for mild colitis. 4. No small bowel or colonic obstruction. 5. There is a  Foley catheter within decompressed urinary bladder. Small amount of air within bladder is probable post instrumentation. 6. Again noted atherosclerotic calcifications of abdominal aorta and iliac arteries. Next the again noted nodular contour of the liver suspicious for cirrhosis. status post cholecystectomy Electronically Signed   By: Lahoma Crocker M.D.   On: 09/05/2016 11:52   Ct Abdomen Pelvis Wo Contrast  Result Date: 09/04/2016 CLINICAL DATA:  Hematuria and flank pain beginning this morning. History of pneumonia, kidney stones, diabetes, hepatitis-C. EXAM: CT ABDOMEN AND PELVIS WITHOUT CONTRAST TECHNIQUE: Multidetector CT imaging of the abdomen and pelvis was performed following the standard protocol without IV contrast. COMPARISON:  Abdominal ultrasound July 18, 2016 FINDINGS: Respiratory motion degraded examination LOWER CHEST: Lung bases are clear. Included heart is moderately enlarged, GI CT  incompletely assessed. No pericardial effusion. HEPATOBILIARY: Status post cholecystectomy. Mildly nodular liver contour. PANCREAS: Normal. SPLEEN: Normal. ADRENALS/URINARY TRACT: Kidneys are orthotopic, demonstrating normal size and morphology. Double-pigtail nephroureteral stent with well-positioned retaining loops in RIGHT renal pelvis and bladder. 3 mm RIGHT lower pole nephrolithiasis. Limited assessment for renal masses on this nonenhanced examination. The unopacified ureters are normal in course and caliber. Urinary bladder is partially distended and unremarkable. Normal adrenal glands. STOMACH/BOWEL: The stomach, small and large bowel are normal in course and caliber, sensitivity decreased by lack of enteric contrast. Focal fluid adjacent versus within RIGHT hepatic flexure with suspected bowel wall thickening. VASCULAR/LYMPHATIC: Aortoiliac vessels are normal in course and caliber, mild calcific atherosclerosis. No lymphadenopathy by CT size criteria though limited sensitivity due to motion and noncontrast examination. REPRODUCTIVE: Mild prostatomegaly. OTHER: Trace free fluid in the pelvis and RIGHT pericolic gutter. MUSCULOSKELETAL: Non-acute. Small fat containing inguinal hernias. Osteopenia. Moderate degenerative change of the hips. IMPRESSION: 3 mm nonobstructing RIGHT nephrolithiasis. Well-positioned RIGHT nephroureteral stent. Focal fluid within the RIGHT upper quadrant likely extraluminal adjacent to RIGHT splenic flexure, bowel pathology possible abscess not confirmed by noncontrast respiratory motion degraded examination. Recommend follow-up CT with enteric contrast when patient able to remain still (and consider intravenous contrast within patient's renal status improves). Recommend colonoscopy after resolution of acute symptoms. Cirrhosis, status post cholecystectomy. Electronically Signed   By: Elon Alas M.D.   On: 09/04/2016 17:25   Dg Chest Port 1 View  Result Date:  09/04/2016 CLINICAL DATA:  Hematuria and flank pain since this morning. EXAM: PORTABLE CHEST 1 VIEW COMPARISON:  07/17/2016 FINDINGS: Artifact overlies chest. Pacemaker/AICD in place. Chronic left ventricular prominence. Chronic aortic atherosclerosis. The patient has taken a poor inspiration. There is chronic scarring on the left related to previous surgery. No sign of active infiltrate, collapse or effusion. IMPRESSION: Poor inspiration. Chronic left ventricular prominence. Pacemaker/ AICD. Chronic pulmonary scarring. No active process evident. Electronically Signed   By: Nelson Chimes M.D.   On: 09/04/2016 15:59    Subjective: Seen and examined and had no complaints and stated abdominal pain improved and diarrhea was slowing down. No nausea or vomiting. Ready to go home.   Discharge Exam: Vitals:   09/08/16 0542 09/08/16 1300  BP: (!) 141/85 139/76  Pulse: 66 69  Resp: 18 18  Temp: 98.7 F (37.1 C) 98 F (36.7 C)   Vitals:   09/07/16 2014 09/07/16 2028 09/08/16 0542 09/08/16 1300  BP: (!) 150/85 101/73 (!) 141/85 139/76  Pulse: 63 94 66 69  Resp: 18 18 18 18   Temp: 97.7 F (36.5 C) 97.7 F (36.5 C) 98.7 F (37.1 C) 98 F (36.7 C)  TempSrc: Oral Oral Oral Oral  SpO2: 100% 97% 94% (!) 4%  Weight:      Height:       General: Pt is alert, awake, not in acute distress Cardiovascular: RRR, S1/S2 +, no rubs, no gallops Respiratory: CTA bilaterally, no wheezing, no rhonchi Abdominal: Soft, NT, ND, bowel sounds + Extremities: no edema, no cyanosis  The results of significant diagnostics from this hospitalization (including imaging, microbiology, ancillary and laboratory) are listed below for reference.    Microbiology: Recent Results (from the past 240 hour(s))  Blood Culture (routine x 2)     Status: Abnormal   Collection Time: 09/04/16  4:01 PM  Result Value Ref Range Status   Specimen Description BLOOD LEFT HAND  Final   Special Requests IN PEDIATRIC BOTTLE 1 CC  Final    Culture  Setup Time   Final    GRAM NEGATIVE RODS IN PEDIATRIC BOTTLE CRITICAL VALUE NOTED.  VALUE IS CONSISTENT WITH PREVIOUSLY REPORTED AND CALLED VALUE.    Culture (A)  Final    PROTEUS MIRABILIS SUSCEPTIBILITIES PERFORMED ON PREVIOUS CULTURE WITHIN THE LAST 5 DAYS. Performed at [facility], [place] [address], [place], [place] [address]    Report Status 09/07/2016 FINAL  Final  Blood Culture (routine x 2)     Status: Abnormal   Collection Time: 09/04/16  4:03 PM  Result Value Ref Range Status   Specimen Description BLOOD RIGHT ANTECUBITAL  Final   Special Requests BOTTLES DRAWN AEROBIC AND ANAEROBIC 5 CC EA  Final   Culture  Setup Time   Final    ANAEROBIC BOTTLE ONLY GRAM NEGATIVE RODS CRITICAL RESULT CALLED TO, READ BACK BY AND VERIFIED WITH: Stefan Church [id number] MLM Performed at [facility], [place] [address] [place], [place] [address]    Culture PROTEUS MIRABILIS (A)  Final   Report Status 09/07/2016 FINAL  Final   Organism ID, Bacteria PROTEUS MIRABILIS  Final      Susceptibility   Proteus mirabilis - MIC*    AMPICILLIN <=2 SENSITIVE Sensitive     CEFAZOLIN <=4 SENSITIVE Sensitive     CEFEPIME <=1 SENSITIVE Sensitive     CEFTAZIDIME <=1 SENSITIVE Sensitive     CEFTRIAXONE <=1 SENSITIVE Sensitive     CIPROFLOXACIN <=0.25 SENSITIVE Sensitive     GENTAMICIN <=1 SENSITIVE Sensitive     IMIPENEM 8 INTERMEDIATE Intermediate     TRIMETH/SULFA <=20 SENSITIVE Sensitive     AMPICILLIN/SULBACTAM <=2 SENSITIVE Sensitive     PIP/TAZO <=4 SENSITIVE Sensitive     * PROTEUS MIRABILIS  Blood Culture ID Panel (Reflexed)     Status: Abnormal   Collection Time: 09/04/16  4:03 PM  Result Value Ref Range Status   Enterococcus species NOT DETECTED NOT DETECTED Final   Listeria monocytogenes NOT DETECTED NOT DETECTED Final   Staphylococcus species NOT DETECTED NOT DETECTED Final   Staphylococcus aureus NOT DETECTED NOT DETECTED Final   Streptococcus species NOT  DETECTED NOT DETECTED Final   Streptococcus agalactiae NOT DETECTED NOT DETECTED Final   Streptococcus pneumoniae NOT DETECTED NOT DETECTED Final   Streptococcus pyogenes NOT DETECTED NOT DETECTED Final   Acinetobacter baumannii NOT DETECTED NOT DETECTED Final   Enterobacteriaceae species DETECTED (A) NOT DETECTED Final    Comment: Enterobacteriaceae represent a large family of gram-negative bacteria, not a single organism. CRITICAL RESULT CALLED TO, READ BACK BY AND VERIFIED WITH: PHARMD Renford Dills [id number] MLM    Enterobacter cloacae complex NOT DETECTED NOT DETECTED Final   Escherichia  coli NOT DETECTED NOT DETECTED Final   Klebsiella oxytoca NOT DETECTED NOT DETECTED Final   Klebsiella pneumoniae NOT DETECTED NOT DETECTED Final   Proteus species DETECTED (A) NOT DETECTED Final    Comment: CRITICAL RESULT CALLED TO, READ BACK BY AND VERIFIED WITH: PHARMD M LILLISON [id number] 1208PM MLM    Serratia marcescens NOT DETECTED NOT DETECTED Final   Carbapenem resistance NOT DETECTED NOT DETECTED Final   Haemophilus influenzae NOT DETECTED NOT DETECTED Final   Neisseria meningitidis NOT DETECTED NOT DETECTED Final   Pseudomonas aeruginosa NOT DETECTED NOT DETECTED Final   Candida albicans NOT DETECTED NOT DETECTED Final   Candida glabrata NOT DETECTED NOT DETECTED Final   Candida krusei NOT DETECTED NOT DETECTED Final   Candida parapsilosis NOT DETECTED NOT DETECTED Final   Candida tropicalis NOT DETECTED NOT DETECTED Final    Comment: Performed at [facility], [place] [address] [place], [place] [address]  MRSA PCR Screening     Status: None   Collection Time: 09/04/16  7:00 PM  Result Value Ref Range Status   MRSA by PCR NEGATIVE NEGATIVE Final    Comment:        The GeneXpert MRSA Assay (FDA approved for NASAL specimens only), is one component of a comprehensive MRSA colonization surveillance program. It is not intended to diagnose MRSA infection nor to guide or monitor  treatment for MRSA infections.   Urine culture     Status: None   Collection Time: 09/04/16  9:42 PM  Result Value Ref Range Status   Specimen Description URINE, CATHETERIZED  Final   Special Requests NONE  Final   Culture   Final    NO GROWTH Performed at [facility], [address], [place],  [address]    Report Status 09/06/2016 FINAL  Final  C difficile quick scan w PCR reflex     Status: Abnormal   Collection Time: 09/05/16  1:15 PM  Result Value Ref Range Status   C Diff antigen POSITIVE (A) NEGATIVE Final   C Diff toxin POSITIVE (A) NEGATIVE Final   C Diff interpretation Toxin producing C. difficile detected.  Final    Comment: CRITICAL RESULT CALLED TO, READ BACK BY AND VERIFIED WITH: R.JOHNSON RN [phone] A.QUIZON   Gastrointestinal Panel by PCR , Stool     Status: None   Collection Time: 09/05/16  3:46 PM  Result Value Ref Range Status   Campylobacter species NOT DETECTED NOT DETECTED Final   Plesimonas shigelloides NOT DETECTED NOT DETECTED Final   Salmonella species NOT DETECTED NOT DETECTED Final   Yersinia enterocolitica NOT DETECTED NOT DETECTED Final   Vibrio species NOT DETECTED NOT DETECTED Final   Vibrio cholerae NOT DETECTED NOT DETECTED Final   Enteroaggregative E coli (EAEC) NOT DETECTED NOT DETECTED Final   Enteropathogenic E coli (EPEC) NOT DETECTED NOT DETECTED Final   Enterotoxigenic E coli (ETEC) NOT DETECTED NOT DETECTED Final   Shiga like toxin producing E coli (STEC) NOT DETECTED NOT DETECTED Final   Shigella/Enteroinvasive E coli (EIEC) NOT DETECTED NOT DETECTED Final   Cryptosporidium NOT DETECTED NOT DETECTED Final   Cyclospora cayetanensis NOT DETECTED NOT DETECTED Final   Entamoeba histolytica NOT DETECTED NOT DETECTED Final   Giardia lamblia NOT DETECTED NOT DETECTED Final   Adenovirus F40/41 NOT DETECTED NOT DETECTED Final   Astrovirus NOT DETECTED NOT DETECTED Final   Norovirus GI/GII NOT DETECTED NOT DETECTED Final    Rotavirus A NOT DETECTED NOT  DETECTED Final   Sapovirus (I, II, IV, and V) NOT DETECTED NOT DETECTED Final    Labs: BNP (last 3 results) No results for input(s): BNP in the last 8760 hours. Basic Metabolic Panel:  Recent Labs Lab 09/04/16 1435 09/05/16 0323 09/06/16 0312 09/07/16 0517 09/08/16 0340  NA 131* 132* 130* 132* 134*  K 4.2 4.0 3.7 4.0 3.5  CL 99* 108 107 109 107  CO2 18* 18* 17* 18* 20*  GLUCOSE 278* 170* 111* 178* 117*  BUN 51* 36* 23* 17 12  CREATININE 2.66* 1.86* 1.61*  1.59* 1.37* 1.24  CALCIUM 9.2 7.9* 7.7* 8.1* 8.2*  MG  --  1.6* 1.8 1.7 1.6*  PHOS  --  1.8* 2.0* 2.3* 2.7   Liver Function Tests:  Recent Labs Lab 09/04/16 1435 09/05/16 0323 09/06/16 0312 09/07/16 0517 09/08/16 0340  AST 29 17 20 24 20   ALT 19 14* 13* 13* 13*  ALKPHOS 64 45 42 52 43  BILITOT 2.3* 1.4* 0.6 0.5 0.6  PROT 9.2* 7.2 6.3* 6.8 6.6  ALBUMIN 3.5 2.6* 2.3* 2.4* 2.4*    Recent Labs Lab 09/04/16 1435  LIPASE 24    Recent Labs Lab 09/04/16 1435  AMMONIA <9*   CBC:  Recent Labs Lab 09/04/16 1435 09/05/16 0323 09/06/16 0312 09/07/16 0517 09/08/16 0340  WBC 28.8* 14.9* 12.1* 9.4 8.5  NEUTROABS 26.6* 12.7* 9.9* 7.4 6.4  HGB 10.8* 9.0* 7.9* 8.2* 8.9*  HCT 32.0* 26.6* 23.1* 23.8* 26.1*  MCV 74.1* 74.7* 75.2* 75.1* 75.0*  PLT 115* 92* 84* 116* 102*   Cardiac Enzymes:  Recent Labs Lab 09/04/16 1901 09/05/16 0209 09/05/16 0720  TROPONINI 0.23* 0.25* 0.24*   BNP: Invalid input(s): POCBNP CBG:  Recent Labs Lab 09/07/16 1111 09/07/16 1706 09/07/16 2012 09/08/16 0732 09/08/16 1228  GLUCAP 127* 107* 149* 166* 106*   D-Dimer No results for input(s): DDIMER in the last 72 hours. Hgb A1c No results for input(s): HGBA1C in the last 72 hours. Lipid Profile No results for input(s): CHOL, HDL, LDLCALC, TRIG, CHOLHDL, LDLDIRECT in the last 72 hours. Thyroid function studies No results for input(s): TSH, T4TOTAL, T3FREE, THYROIDAB in the last 72  hours.  Invalid input(s): FREET3 Anemia work up No results for input(s): VITAMINB12, FOLATE, FERRITIN, TIBC, IRON, RETICCTPCT in the last 72 hours. Urinalysis    Component Value Date/Time   COLORURINE YELLOW 09/04/2016 2142   APPEARANCEUR HAZY (A) 09/04/2016 2142   LABSPEC 1.018 09/04/2016 2142   PHURINE 5.0 09/04/2016 2142   GLUCOSEU 150 (A) 09/04/2016 2142   HGBUR LARGE (A) 09/04/2016 2142   BILIRUBINUR NEGATIVE 09/04/2016 2142   [place] NEGATIVE 09/04/2016 2142   PROTEINUR 30 (A) 09/04/2016 2142   UROBILINOGEN 2.0 (H) 04/11/2014 2249   NITRITE NEGATIVE 09/04/2016 2142   LEUKOCYTESUR LARGE (A) 09/04/2016 2142   Sepsis Labs Invalid input(s): PROCALCITONIN,  WBC,  LACTICIDVEN Microbiology Recent Results (from the past 240 hour(s))  Blood Culture (routine x 2)     Status: Abnormal   Collection Time: 09/04/16  4:01 PM  Result Value Ref Range Status   Specimen Description BLOOD LEFT HAND  Final   Special Requests IN PEDIATRIC BOTTLE 1 CC  Final   Culture  Setup Time   Final    GRAM NEGATIVE RODS IN PEDIATRIC BOTTLE CRITICAL VALUE NOTED.  VALUE IS CONSISTENT WITH PREVIOUSLY REPORTED AND CALLED VALUE.    Culture (A)  Final    PROTEUS MIRABILIS SUSCEPTIBILITIES PERFORMED ON PREVIOUS CULTURE WITHIN THE LAST 5 DAYS. Performed  at [facility], [place] [address] [place], [place] [address]    Report Status 09/07/2016 FINAL  Final  Blood Culture (routine x 2)     Status: Abnormal   Collection Time: 09/04/16  4:03 PM  Result Value Ref Range Status   Specimen Description BLOOD RIGHT ANTECUBITAL  Final   Special Requests BOTTLES DRAWN AEROBIC AND ANAEROBIC 5 CC EA  Final   Culture  Setup Time   Final    ANAEROBIC BOTTLE ONLY GRAM NEGATIVE RODS CRITICAL RESULT CALLED TO, READ BACK BY AND VERIFIED WITH: Stefan Church [id number] MLM Performed at [facility], [place] [address], [place], [place] [address]    Culture PROTEUS MIRABILIS (A)  Final   Report Status  09/07/2016 FINAL  Final   Organism ID, Bacteria PROTEUS MIRABILIS  Final      Susceptibility   Proteus mirabilis - MIC*    AMPICILLIN <=2 SENSITIVE Sensitive     CEFAZOLIN <=4 SENSITIVE Sensitive     CEFEPIME <=1 SENSITIVE Sensitive     CEFTAZIDIME <=1 SENSITIVE Sensitive     CEFTRIAXONE <=1 SENSITIVE Sensitive     CIPROFLOXACIN <=0.25 SENSITIVE Sensitive     GENTAMICIN <=1 SENSITIVE Sensitive     IMIPENEM 8 INTERMEDIATE Intermediate     TRIMETH/SULFA <=20 SENSITIVE Sensitive     AMPICILLIN/SULBACTAM <=2 SENSITIVE Sensitive     PIP/TAZO <=4 SENSITIVE Sensitive     * PROTEUS MIRABILIS  Blood Culture ID Panel (Reflexed)     Status: Abnormal   Collection Time: 09/04/16  4:03 PM  Result Value Ref Range Status   Enterococcus species NOT DETECTED NOT DETECTED Final   Listeria monocytogenes NOT DETECTED NOT DETECTED Final   Staphylococcus species NOT DETECTED NOT DETECTED Final   Staphylococcus aureus NOT DETECTED NOT DETECTED Final   Streptococcus species NOT DETECTED NOT DETECTED Final   Streptococcus agalactiae NOT DETECTED NOT DETECTED Final   Streptococcus pneumoniae NOT DETECTED NOT DETECTED Final   Streptococcus pyogenes NOT DETECTED NOT DETECTED Final   Acinetobacter baumannii NOT DETECTED NOT DETECTED Final   Enterobacteriaceae species DETECTED (A) NOT DETECTED Final    Comment: Enterobacteriaceae represent a large family of gram-negative bacteria, not a single organism. CRITICAL RESULT CALLED TO, READ BACK BY AND VERIFIED WITH: PHARMD M LILLISON [id number] 1208PM MLM    Enterobacter cloacae complex NOT DETECTED NOT DETECTED Final   Escherichia coli NOT DETECTED NOT DETECTED Final   Klebsiella oxytoca NOT DETECTED NOT DETECTED Final   Klebsiella pneumoniae NOT DETECTED NOT DETECTED Final   Proteus species DETECTED (A) NOT DETECTED Final    Comment: CRITICAL RESULT CALLED TO, READ BACK BY AND VERIFIED WITH: PHARMD M LILLISON [id number] 1208PM MLM    Serratia marcescens NOT DETECTED  NOT DETECTED Final   Carbapenem resistance NOT DETECTED NOT DETECTED Final   Haemophilus influenzae NOT DETECTED NOT DETECTED Final   Neisseria meningitidis NOT DETECTED NOT DETECTED Final   Pseudomonas aeruginosa NOT DETECTED NOT DETECTED Final   Candida albicans NOT DETECTED NOT DETECTED Final   Candida glabrata NOT DETECTED NOT DETECTED Final   Candida krusei NOT DETECTED NOT DETECTED Final   Candida parapsilosis NOT DETECTED NOT DETECTED Final   Candida tropicalis NOT DETECTED NOT DETECTED Final    Comment: Performed at [facility], [place] [address] [place], [place] [address]  MRSA PCR Screening     Status: None   Collection Time: 09/04/16  7:00 PM  Result Value Ref Range Status  MRSA by PCR NEGATIVE NEGATIVE Final    Comment:        The GeneXpert MRSA Assay (FDA approved for NASAL specimens only), is one component of a comprehensive MRSA colonization surveillance program. It is not intended to diagnose MRSA infection nor to guide or monitor treatment for MRSA infections.   Urine culture     Status: None   Collection Time: 09/04/16  9:42 PM  Result Value Ref Range Status   Specimen Description URINE, CATHETERIZED  Final   Special Requests NONE  Final   Culture   Final    NO GROWTH Performed at [facility], [address], [place], [place] [address]    Report Status 09/06/2016 FINAL  Final  C difficile quick scan w PCR reflex     Status: Abnormal   Collection Time: 09/05/16  1:15 PM  Result Value Ref Range Status   C Diff antigen POSITIVE (A) NEGATIVE Final   C Diff toxin POSITIVE (A) NEGATIVE Final   C Diff interpretation Toxin producing C. difficile detected.  Final    Comment: CRITICAL RESULT CALLED TO, READ BACK BY AND VERIFIED WITH: R.JOHNSON RN [phone] [email or web address]   Gastrointestinal Panel by PCR , Stool     Status: None   Collection Time: 09/05/16  3:46 PM  Result Value Ref Range Status   Campylobacter species NOT DETECTED NOT DETECTED Final    Plesimonas shigelloides NOT DETECTED NOT DETECTED Final   Salmonella species NOT DETECTED NOT DETECTED Final   Yersinia enterocolitica NOT DETECTED NOT DETECTED Final   Vibrio species NOT DETECTED NOT DETECTED Final   Vibrio cholerae NOT DETECTED NOT DETECTED Final   Enteroaggregative E coli (EAEC) NOT DETECTED NOT DETECTED Final   Enteropathogenic E coli (EPEC) NOT DETECTED NOT DETECTED Final   Enterotoxigenic E coli (ETEC) NOT DETECTED NOT DETECTED Final   Shiga like toxin producing E coli (STEC) NOT DETECTED NOT DETECTED Final   Shigella/Enteroinvasive E coli (EIEC) NOT DETECTED NOT DETECTED Final   Cryptosporidium NOT DETECTED NOT DETECTED Final   Cyclospora cayetanensis NOT DETECTED NOT DETECTED Final   Entamoeba histolytica NOT DETECTED NOT DETECTED Final   Giardia lamblia NOT DETECTED NOT DETECTED Final   Adenovirus F40/41 NOT DETECTED NOT DETECTED Final   Astrovirus NOT DETECTED NOT DETECTED Final   Norovirus GI/GII NOT DETECTED NOT DETECTED Final   Rotavirus A NOT DETECTED NOT DETECTED Final   Sapovirus (I, II, IV, and V) NOT DETECTED NOT DETECTED Final   Time coordinating discharge: 40 minutes  SIGNED:  Kerney Elbe, DO [facility] 09/08/2016, 1:45 PM Pager [phone]  If 7PM-7AM, please contact night-coverage [email or web address] Password [email or web address]

## 2016-09-08 NOTE — Progress Notes (Signed)
OT Cancellation Note  Patient Details Name: Anthony Mcfarland MRN: [id number] DOB: 12-16-1950   Cancelled Treatment:    Reason Eval/Treat Not Completed: Fatigue/lethargy limiting ability to participate -- Patient had just ambulated in hallways with nursing. Reports he is independent with ADLs. Will check on patient for OT evaluation at later date/time.  Sheetal Lyall A Kurtiss Wence 09/08/2016, 9:57 AM

## 2016-09-08 NOTE — Progress Notes (Signed)
Per CM, all pharmacies that provide patient's prescription for PO Vancomycin are closed today. CM states she spoke to MD, who stated pt cannot be discharged until after receiving 2200 dose of PO Vanc. Pt and daughter made aware. Daughter c/o inconvience of late D/C and having to get med filled early tomorrow AM. RN apologized several times. Spoke with pharmacy who stated 1800 dose can be given early, along with 2200 dose. Will pass on to next shift. Pt and daughter agree to late D/C tonight.

## 2016-09-08 NOTE — Progress Notes (Signed)
qPhysical Therapy Treatment Patient Details Name: Anthony Mcfarland MRN: [id number] DOB: 1951-01-09 Today's Date: 09/08/2016    History of Present Illness 66 yo male admitted with sepsis 2* UTI. Hx of A flutter s/p cardioversion, CAD, Hep C, DM, cirrhosis.     PT Comments    Pt cooperative and progressing with mobility but continues ltd by knee pain with ambulation.     Follow Up Recommendations  Home health PT;Supervision/Assistance - 24 hour     Equipment Recommendations  Rolling walker with 5" wheels    Recommendations for Other Services       Precautions / Restrictions Precautions Precautions: Fall Restrictions Weight Bearing Restrictions: No    Mobility  Bed Mobility Overal bed mobility: Needs Assistance Bed Mobility: Supine to Sit     Supine to sit: Supervision     General bed mobility comments: Increased time  Transfers Overall transfer level: Needs assistance Equipment used: Straight cane;Rolling walker (2 wheeled) Transfers: Sit to/from Stand Sit to Stand: Min guard         General transfer comment: Assist to rise, stabilize, control descent. VCs hand placement  Ambulation/Gait Ambulation/Gait assistance: Min assist;Supervision Ambulation Distance (Feet): 450 Feet Assistive device: Rolling walker (2 wheeled);Straight cane Gait Pattern/deviations: Step-through pattern;Decreased stride length;Wide base of support Gait velocity: decr Gait velocity interpretation: Below normal speed for age/gender General Gait Details: Assist to stabilize. Pt c/o knee pain. Pt ambulated 300' with SPC and min assist and additional 150' with RW and min guard to sup with cues for posture and position from [employer]            Wheelchair Mobility    Modified Rankin (Stroke Patients Only)       Balance Overall balance assessment: Needs assistance Sitting-balance support: No upper extremity supported;Feet supported Sitting balance-Leahy Scale: Good      Standing balance support: No upper extremity supported Standing balance-Leahy Scale: Fair                              Cognition Arousal/Alertness: Awake/alert Behavior During Therapy: Flat affect Overall Cognitive Status: Within Functional Limits for tasks assessed                                        Exercises      General Comments        Pertinent Vitals/Pain Pain Assessment: 0-10 Pain Score: 8  Pain Location: abdomen, knees with ambulation Pain Descriptors / Indicators: Aching;Sore Pain Intervention(s): Limited activity within patient's tolerance;Monitored during session;Premedicated before session;Patient requesting pain meds-RN notified    Home Living                      Prior Function            PT Goals (current goals can now be found in the care plan section) Acute Rehab PT Goals Patient Stated Goal: none stated PT Goal Formulation: With patient Time For Goal Achievement: 09/20/16 Potential to Achieve Goals: Good Progress towards PT goals: Progressing toward goals    Frequency    Min 3X/week      PT Plan Current plan remains appropriate    Co-evaluation             End of Session Equipment Utilized During Treatment: Gait belt Activity Tolerance: Patient limited by fatigue;Patient limited by  pain Patient left: in chair;with call bell/phone within reach;with nursing/sitter in room;with chair alarm set Nurse Communication: Mobility status PT Visit Diagnosis: Muscle weakness (generalized) (M62.81);Difficulty in walking, not elsewhere classified (R26.2)     Time: 9892-1194 PT Time Calculation (min) (ACUTE ONLY): 28 min  Charges:  $Gait Training: 23-37 mins                    G Codes:       PG [phone]  Deshon Koslowski 09/08/2016, 11:14 AM

## 2016-09-08 NOTE — Care Management Note (Signed)
Case Management Note  Patient Details  Name: Anthony Mcfarland MRN: [id number] Date of Birth: 09-Mar-1951  Subjective/Objective:    Sepsis d/t UTI, DM, CAD, Anemia                Action/Plan: Discharge Planning: NCM spoke to pt and offered choice to dtr for [facility] PT/OT. Pt agreeable to [facility]. Contacted [facility] with new referral. Dtr requesting RW at home. Contacted [facility] DME rep for RW for home. [place] are currently closed. Pt will need oral Vancomycin filled at one of these pharmacy. Made dtr, Geni Bers aware. She will pick up in am.   PCP Ronnie Doss MD  Expected Discharge Date:  09/08/16               Expected Discharge Plan:  [place]  In-House Referral:  NA  Discharge planning Services  CM Consult  Post Acute Care Choice:  Home Health Choice offered to:  Adult Children  DME Arranged:  Walker rolling DME Agency:  [facility] Arranged:  PT, OT [place] Agency:  [facility] ([facility]  Status of Service:  Completed, signed off  If discussed at [employer] of Stay Meetings, dates discussed:    Additional Comments:  Erenest Rasher, RN 09/08/2016, 4:14 PM

## 2016-09-13 LAB — CULTURE, BLOOD (ROUTINE X 2)
CULTURE: NO GROWTH
Culture: NO GROWTH
Special Requests: ADEQUATE

## 2016-11-08 NOTE — Addendum Note (Signed)
Addendum  created 11/08/16 1118 by Shaelynn Dragos, MD   Sign clinical note    

## 2017-06-10 ENCOUNTER — Inpatient Hospital Stay (HOSPITAL_COMMUNITY)
Admission: EM | Admit: 2017-06-10 | Discharge: 2017-06-12 | DRG: 918 | Disposition: A | Discharge status: Home / Self Care | Payer: Medicare Other | Attending: Cardiology | Admitting: Cardiology

## 2017-06-10 ENCOUNTER — Encounter (HOSPITAL_COMMUNITY): Payer: Self-pay | Admitting: Emergency Medicine

## 2017-06-10 ENCOUNTER — Emergency Department (HOSPITAL_COMMUNITY): Payer: Medicare Other

## 2017-06-10 ENCOUNTER — Other Ambulatory Visit: Payer: Self-pay

## 2017-06-10 ENCOUNTER — Inpatient Hospital Stay (HOSPITAL_COMMUNITY): Payer: Medicare Other

## 2017-06-10 DIAGNOSIS — Z794 Long term (current) use of insulin: Secondary | ICD-10-CM | POA: Diagnosis not present

## 2017-06-10 DIAGNOSIS — N184 Chronic kidney disease, stage 4 (severe): Secondary | ICD-10-CM

## 2017-06-10 DIAGNOSIS — N171 Acute kidney failure with acute cortical necrosis: Secondary | ICD-10-CM | POA: Diagnosis not present

## 2017-06-10 DIAGNOSIS — Z7901 Long term (current) use of anticoagulants: Secondary | ICD-10-CM | POA: Diagnosis not present

## 2017-06-10 DIAGNOSIS — Z950 Presence of cardiac pacemaker: Secondary | ICD-10-CM | POA: Diagnosis not present

## 2017-06-10 DIAGNOSIS — R748 Abnormal levels of other serum enzymes: Secondary | ICD-10-CM | POA: Diagnosis not present

## 2017-06-10 DIAGNOSIS — I422 Other hypertrophic cardiomyopathy: Secondary | ICD-10-CM | POA: Diagnosis not present

## 2017-06-10 DIAGNOSIS — R7989 Other specified abnormal findings of blood chemistry: Secondary | ICD-10-CM

## 2017-06-10 DIAGNOSIS — F1410 Cocaine abuse, uncomplicated: Secondary | ICD-10-CM | POA: Diagnosis not present

## 2017-06-10 DIAGNOSIS — Z4502 Encounter for adjustment and management of automatic implantable cardiac defibrillator: Secondary | ICD-10-CM | POA: Diagnosis not present

## 2017-06-10 DIAGNOSIS — Z91018 Allergy to other foods: Secondary | ICD-10-CM | POA: Diagnosis not present

## 2017-06-10 DIAGNOSIS — Z955 Presence of coronary angioplasty implant and graft: Secondary | ICD-10-CM | POA: Diagnosis not present

## 2017-06-10 DIAGNOSIS — R778 Other specified abnormalities of plasma proteins: Secondary | ICD-10-CM

## 2017-06-10 DIAGNOSIS — Z9049 Acquired absence of other specified parts of digestive tract: Secondary | ICD-10-CM | POA: Diagnosis not present

## 2017-06-10 DIAGNOSIS — Z95810 Presence of automatic (implantable) cardiac defibrillator: Secondary | ICD-10-CM | POA: Diagnosis not present

## 2017-06-10 DIAGNOSIS — Z800 Family history of malignant neoplasm of digestive organs: Secondary | ICD-10-CM | POA: Diagnosis not present

## 2017-06-10 DIAGNOSIS — I361 Nonrheumatic tricuspid (valve) insufficiency: Secondary | ICD-10-CM | POA: Diagnosis not present

## 2017-06-10 DIAGNOSIS — N179 Acute kidney failure, unspecified: Secondary | ICD-10-CM | POA: Diagnosis present

## 2017-06-10 DIAGNOSIS — T405X1A Poisoning by cocaine, accidental (unintentional), initial encounter: Principal | ICD-10-CM | POA: Diagnosis present

## 2017-06-10 DIAGNOSIS — I248 Other forms of acute ischemic heart disease: Secondary | ICD-10-CM | POA: Diagnosis present

## 2017-06-10 DIAGNOSIS — Z881 Allergy status to other antibiotic agents status: Secondary | ICD-10-CM

## 2017-06-10 DIAGNOSIS — E119 Type 2 diabetes mellitus without complications: Secondary | ICD-10-CM | POA: Diagnosis present

## 2017-06-10 DIAGNOSIS — E860 Dehydration: Secondary | ICD-10-CM | POA: Diagnosis present

## 2017-06-10 DIAGNOSIS — R55 Syncope and collapse: Secondary | ICD-10-CM

## 2017-06-10 DIAGNOSIS — I421 Obstructive hypertrophic cardiomyopathy: Secondary | ICD-10-CM | POA: Diagnosis present

## 2017-06-10 DIAGNOSIS — I2510 Atherosclerotic heart disease of native coronary artery without angina pectoris: Secondary | ICD-10-CM | POA: Diagnosis present

## 2017-06-10 DIAGNOSIS — F1490 Cocaine use, unspecified, uncomplicated: Secondary | ICD-10-CM

## 2017-06-10 HISTORY — DX: Unspecified osteoarthritis, unspecified site: M19.90

## 2017-06-10 HISTORY — DX: Hypothyroidism, unspecified: E03.9

## 2017-06-10 HISTORY — DX: Presence of cardiac pacemaker: Z95.0

## 2017-06-10 HISTORY — DX: Syncope and collapse: R55

## 2017-06-10 HISTORY — DX: Acute myocardial infarction, unspecified: I21.9

## 2017-06-10 HISTORY — DX: Other chronic pain: G89.29

## 2017-06-10 HISTORY — DX: Chronic kidney disease, stage 3 (moderate): N18.3

## 2017-06-10 HISTORY — DX: Type 2 diabetes mellitus without complications: E11.9

## 2017-06-10 HISTORY — DX: Chronic kidney disease, stage 3 unspecified: N18.30

## 2017-06-10 HISTORY — DX: Dependence on other enabling machines and devices: Z99.89

## 2017-06-10 HISTORY — DX: Pure hypercholesterolemia, unspecified: E78.00

## 2017-06-10 HISTORY — DX: Low back pain, unspecified: M54.50

## 2017-06-10 HISTORY — DX: Obstructive sleep apnea (adult) (pediatric): G47.33

## 2017-06-10 HISTORY — DX: Presence of automatic (implantable) cardiac defibrillator: Z95.810

## 2017-06-10 HISTORY — DX: Low back pain: M54.5

## 2017-06-10 HISTORY — DX: Personal history of other medical treatment: Z92.89

## 2017-06-10 LAB — COMPREHENSIVE METABOLIC PANEL
ALT: 18 U/L (ref 17–63)
AST: 25 U/L (ref 15–41)
Albumin: 4.2 g/dL (ref 3.5–5.0)
Alkaline Phosphatase: 69 U/L (ref 38–126)
Anion gap: 10 (ref 5–15)
BUN: 44 mg/dL — AB (ref 6–20)
CHLORIDE: 107 mmol/L (ref 101–111)
CO2: 19 mmol/L — AB (ref 22–32)
CREATININE: 2.86 mg/dL — AB (ref 0.61–1.24)
Calcium: 9.4 mg/dL (ref 8.9–10.3)
GFR calc Af Amer: 25 mL/min — ABNORMAL LOW (ref >60)
GFR, EST NON AFRICAN AMERICAN: 21 mL/min — AB (ref >60)
Glucose, Bld: 207 mg/dL — ABNORMAL HIGH (ref 65–99)
Potassium: 3.7 mmol/L (ref 3.5–5.1)
Sodium: 136 mmol/L (ref 135–145)
Total Bilirubin: 1.5 mg/dL — ABNORMAL HIGH (ref 0.3–1.2)
Total Protein: 8.1 g/dL (ref 6.5–8.1)

## 2017-06-10 LAB — CBC WITH DIFFERENTIAL/PLATELET
BASOS ABS: 0 10*3/uL (ref 0.0–0.1)
Basophils Relative: 0 %
EOS PCT: 0 %
Eosinophils Absolute: 0 10*3/uL (ref 0.0–0.7)
HCT: 37.7 % — ABNORMAL LOW (ref 39.0–52.0)
Hemoglobin: 12.6 g/dL — ABNORMAL LOW (ref 13.0–17.0)
LYMPHS PCT: 21 %
Lymphs Abs: 1.3 10*3/uL (ref 0.7–4.0)
MCH: 26.9 pg (ref 26.0–34.0)
MCHC: 33.4 g/dL (ref 30.0–36.0)
MCV: 80.6 fL (ref 78.0–100.0)
Monocytes Absolute: 0.4 10*3/uL (ref 0.1–1.0)
Monocytes Relative: 6 %
NEUTROS ABS: 4.6 10*3/uL (ref 1.7–7.7)
NEUTROS PCT: 73 %
PLATELETS: 121 10*3/uL — AB (ref 150–400)
RBC: 4.68 MIL/uL (ref 4.22–5.81)
RDW: 16.2 % — ABNORMAL HIGH (ref 11.5–15.5)
WBC: 6.3 10*3/uL (ref 4.0–10.5)

## 2017-06-10 LAB — RAPID URINE DRUG SCREEN, HOSP PERFORMED
Amphetamines: NOT DETECTED
BARBITURATES: NOT DETECTED
Benzodiazepines: NOT DETECTED
COCAINE: POSITIVE — AB
Opiates: NOT DETECTED
TETRAHYDROCANNABINOL: NOT DETECTED

## 2017-06-10 LAB — TROPONIN I: Troponin I: 0.26 ng/mL (ref <0.03)

## 2017-06-10 LAB — TSH: TSH: 0.831 u[IU]/mL (ref 0.350–4.500)

## 2017-06-10 LAB — I-STAT TROPONIN, ED: TROPONIN I, POC: 0.14 ng/mL — AB (ref 0.00–0.08)

## 2017-06-10 MED ORDER — CARVEDILOL 25 MG PO TABS
25.0000 mg | ORAL_TABLET | Freq: Two times a day (BID) | ORAL | Status: DC
  Administered 2017-06-10 – 2017-06-12 (×4): 25 mg via ORAL
  Filled 2017-06-10 (×2): qty 2
  Filled 2017-06-10 (×2): qty 1

## 2017-06-10 MED ORDER — ACETAMINOPHEN 325 MG PO TABS: 650.0000 mg | ORAL_TABLET | ORAL | Status: DC | PRN

## 2017-06-10 MED ORDER — ATORVASTATIN CALCIUM 10 MG PO TABS
10.0000 mg | ORAL_TABLET | Freq: Every day | ORAL | Status: DC
  Administered 2017-06-10 – 2017-06-11 (×2): 10 mg via ORAL
  Filled 2017-06-10 (×2): qty 1

## 2017-06-10 MED ORDER — INSULIN GLARGINE 100 UNIT/ML ~~LOC~~ SOLN
20.0000 [IU] | Freq: Every day | SUBCUTANEOUS | Status: DC
  Administered 2017-06-10 – 2017-06-11 (×2): 20 [IU] via SUBCUTANEOUS
  Filled 2017-06-10 (×3): qty 0.2

## 2017-06-10 MED ORDER — AMLODIPINE BESYLATE 10 MG PO TABS
10.0000 mg | ORAL_TABLET | Freq: Every day | ORAL | Status: DC
  Administered 2017-06-11 – 2017-06-12 (×2): 10 mg via ORAL
  Filled 2017-06-10: qty 2
  Filled 2017-06-10: qty 1
  Filled 2017-06-10: qty 2

## 2017-06-10 MED ORDER — SODIUM CHLORIDE 0.9 % IV SOLN
INTRAVENOUS | Status: DC
  Administered 2017-06-10 – 2017-06-11 (×2): via INTRAVENOUS

## 2017-06-10 MED ORDER — NITROGLYCERIN 0.4 MG SL SUBL: 0.4000 mg | SUBLINGUAL_TABLET | SUBLINGUAL | Status: DC | PRN

## 2017-06-10 MED ORDER — TERAZOSIN HCL 2 MG PO CAPS
2.0000 mg | ORAL_CAPSULE | Freq: Every day | ORAL | Status: DC
  Administered 2017-06-10 – 2017-06-11 (×2): 2 mg via ORAL
  Filled 2017-06-10 (×4): qty 1

## 2017-06-10 MED ORDER — ONDANSETRON HCL 4 MG/2ML IJ SOLN: 4.0000 mg | Freq: Four times a day (QID) | INTRAMUSCULAR | Status: DC | PRN

## 2017-06-10 MED ORDER — ALPRAZOLAM 0.25 MG PO TABS: 0.2500 mg | ORAL_TABLET | Freq: Two times a day (BID) | ORAL | Status: DC | PRN

## 2017-06-10 MED ORDER — VITAMIN D 1000 UNITS PO TABS
2000.0000 [IU] | ORAL_TABLET | Freq: Every day | ORAL | Status: DC
  Administered 2017-06-10 – 2017-06-12 (×3): 2000 [IU] via ORAL
  Filled 2017-06-10 (×3): qty 2

## 2017-06-10 MED ORDER — FERROUS SULFATE 325 (65 FE) MG PO TABS
324.0000 mg | ORAL_TABLET | Freq: Every day | ORAL | Status: DC
  Filled 2017-06-10: qty 1

## 2017-06-10 MED ORDER — HEPARIN (PORCINE) IN NACL 100-0.45 UNIT/ML-% IJ SOLN
1000.0000 [IU]/h | INTRAMUSCULAR | Status: DC
  Administered 2017-06-10: 1000 [IU]/h via INTRAVENOUS
  Filled 2017-06-10 (×2): qty 250

## 2017-06-10 MED ORDER — ZOLPIDEM TARTRATE 5 MG PO TABS: 5.0000 mg | ORAL_TABLET | Freq: Every evening | ORAL | Status: DC | PRN

## 2017-06-10 MED ORDER — KETOCONAZOLE 2 % EX CREA
1.0000 "application " | TOPICAL_CREAM | Freq: Every day | CUTANEOUS | Status: DC
  Administered 2017-06-10: 1 via TOPICAL
  Filled 2017-06-10: qty 15

## 2017-06-10 MED ORDER — LEVOTHYROXINE SODIUM 88 MCG PO TABS
88.0000 ug | ORAL_TABLET | Freq: Every day | ORAL | Status: DC
  Administered 2017-06-11 – 2017-06-12 (×2): 88 ug via ORAL
  Filled 2017-06-10 (×3): qty 1

## 2017-06-10 NOTE — H&P (Signed)
Cardiology Admission History and Physical:   Patient ID: Anthony Mcfarland; MRN: [id number]; DOB: 1951-04-23   Admission date: 06/10/2017  Primary Care Provider: Ronnie Doss, MD [facility] Cardiologist:  [facility],  Primary Electrophysiologist: [facility] has seen  Chief Complaint:  syncope  Patient Profile:   Anthony Mcfarland is a 67 y.o. male with a history of HOCM, VT s/p MDT ICD, A flutter s/p ablation x 2, non-obs CAD cath 2008, pt reports a later cardiac stent, heme+ stools when on Plavix and Eliquis, syncope prior to ICD  History of Present Illness:   Anthony Mcfarland was up all night because it was [place] and got home about 5 am. He denies ETOH use, admits to [facility] use, cocaine use. He sat up and was watching TV. He got up out of the chair at 11 am, felt light-headed and dizzy when he stood up. He was on his way to the [place] and took a few steps. He woke up on the floor. His L elbow hurt. It is scraped. No other sig injuries.  He was not rousable for about a minute according to his daughter, who was present.    At first his stomach was hurting, he was incontinent of bowel. After his BM, his stomach felt better.   He did not have chest pain at any time. He was not SOB, no N&V or diaphoretic.   The last time he passed out was years ago.   His family called 40. His CBG was 199 or more. That is very unusual for him.   He took his am meds but did not eat.     Past Medical History:  Diagnosis Date  . Anemia in chronic illness 04/13/2014  . Atrial flutter [facility])    s/p ablation 03/2016 and 06/2016 at [facility].    Marland Kitchen Cardiac pacemaker in situ, MDT 06/10/2017  . Complication of anesthesia    takes awhile to wake up due to kidney and liver functions  . Coronary artery disease   . Diabetes mellitus without complication ([place])   . Gastrointestinal hemorrhage 07/18/2016  . Hepatitis C    This was treated in late 2015 with anti-viral medications overseen at [facility].  Marland Kitchen Syncope 06/10/2017    Past Surgical  History:  Procedure Laterality Date  . CARDIAC DEFIBRILLATOR PLACEMENT     [employer]   . CHOLECYSTECTOMY    . COLONOSCOPY N/A 07/22/2016   Procedure: COLONOSCOPY;  Surgeon: Mauri Pole, MD;  Location: [facility] ENDOSCOPY;  Service: Endoscopy;  Laterality: N/A;  . ESOPHAGOGASTRODUODENOSCOPY N/A 07/19/2016   Procedure: ESOPHAGOGASTRODUODENOSCOPY (EGD);  Surgeon: Milus Banister, MD;  Location: [facility];  Service: Endoscopy;  Laterality: N/A;  . kidney stent Right 2018  . right leg ankle surgery       Medications Prior to Admission: Prior to Admission medications   Medication Sig Start Date End Date Taking? Authorizing Provider  acetaminophen (TYLENOL) 325 MG tablet Take 650 mg by mouth every 6 (six) hours as needed for moderate pain.   Yes [provider]  amLODipine (NORVASC) 10 MG tablet Take 10 mg by mouth daily.   Yes [provider]  apixaban (ELIQUIS) 5 MG TABS tablet Take 5 mg by mouth 2 (two) times daily.   Yes [provider]  atorvastatin (LIPITOR) 20 MG tablet Take 10 mg by mouth at bedtime.   Yes [provider]  carvedilol (COREG) 25 MG tablet Take 25 mg by mouth 2 (two) times daily with a meal.  Yes [provider]  cholecalciferol (VITAMIN D) 1000 units tablet Take 2,000 Units by mouth daily.   Yes [provider]  ferrous sulfate 324 (65 Fe) MG TBEC Take 324 mg by mouth daily.   Yes [provider]  furosemide (LASIX) 20 MG tablet Take 20 mg by mouth daily.    Yes [provider]  gabapentin (NEURONTIN) 300 MG capsule Take 1 capsule (300 mg total) by mouth at bedtime. Patient taking differently: Take 600 mg by mouth 2 (two) times daily.  04/14/14  Yes Rai, Ripudeep K, MD  insulin glargine (LANTUS) 100 UNIT/ML injection Inject 0.3 mLs (30 Units total) into the skin at bedtime. Patient taking differently: Inject 20 Units into the skin at bedtime.  04/14/14  Yes Rai, Ripudeep K, MD  ketoconazole (NIZORAL) 2  % cream Apply 1 application topically at bedtime. Apply to feet and between toes   Yes [provider]  levothyroxine (SYNTHROID, LEVOTHROID) 88 MCG tablet Take 88 mcg by mouth daily.    Yes [provider]  lisinopril (PRINIVIL,ZESTRIL) 20 MG tablet Take 20 mg by mouth daily.   Yes [provider]  Skin Protectants, Misc. (MINERIN) CREA Apply 1 application topically 2 (two) times daily as needed (itching/dry skin).   Yes [provider]  terazosin (HYTRIN) 2 MG capsule Take 2 mg by mouth at bedtime.    Yes [provider]  clopidogrel (PLAVIX) 75 MG tablet Take 1 tablet (75 mg total) by mouth daily. Patient not taking: Reported on 06/10/2017 07/29/16   Reyne Dumas, MD  Multiple Vitamin (MULTIVITAMIN WITH MINERALS) TABS tablet Take 1 tablet by mouth daily. Patient not taking: Reported on 06/10/2017 09/09/16   Kerney Elbe, DO     Allergies:    Allergies  Allergen Reactions  . Grapefruit Extract Other (See Comments)    [facility] doctor told him not to eat  . Other Other (See Comments)    Reaction to  Unknown anesthesia at [facility] January 2018 - trouble waking up  . Amoxicillin Other (See Comments)    Unknown reaction - can tolerate Cephalosporins Has patient had a PCN reaction causing immediate rash, facial/tongue/throat swelling, SOB or lightheadedness with hypotension: unknown Has patient had a PCN reaction causing severe rash involving mucus membranes or skin necrosis: unknown Has patient had a PCN reaction that required hospitalization: unknown Has patient had a PCN reaction occurring within the last 10 years: unknown If all of the above answers are "NO", then may proceed with Cephalosporin    Social History:   Social History   Socioeconomic History  . Marital status: Single    Spouse name: Not on file  . Number of children: Not on file  . Years of education: Not on file  . Highest education level: Not on file  Social Needs  .  Financial resource strain: Not on file  . Food insecurity - worry: Not on file  . Food insecurity - inability: Not on file  . Transportation needs - medical: Not on file  . Transportation needs - non-medical: Not on file  Occupational History  . Not on file  Tobacco Use  . Smoking status: Never Smoker  . Smokeless tobacco: Never Used  Substance and Sexual Activity  . Alcohol use: No  . Drug use: Yes    Types: Marijuana  . Sexual activity: Not on file  Other Topics Concern  . Not on file  Social History Narrative  . Not on file  Family History:   The patient's family history includes Cancer in his mother; Sudden Cardiac Death (age of onset: 23) in his cousin.   Family Status  Relation Name Status  . Mother  Deceased  . Father  Deceased  . Cousin  Deceased    ROS:  Please see the history of present illness.  All other ROS reviewed and negative.     Physical Exam/Data:   Vitals:   06/10/17 1545 06/10/17 1600 06/10/17 1615 06/10/17 1715  BP: 124/74 115/64 (!) 104/59 110/63  Pulse: 61 63 62 60  Resp: 20 13 (!) 0 16  Temp:      TempSrc:      SpO2: 94% 96% 97% 99%  Weight:      Height:       No intake or output data in the 24 hours ending 06/10/17 1748 Filed Weights   06/10/17 1144  Weight: 183 lb 2 oz (83.1 kg)   Body mass index is 24.16 kg/m.  General:  Well nourished, well developed, in no acute distress, sleepy, unless roused HEENT: normal Lymph: no adenopathy Neck: JVD slightly elevated  Endocrine:  No thryomegaly Vascular: No carotid bruits; FA pulses 2+ bilaterally   Cardiac:  normal S1, S2; RRR; no murmur  Lungs: bibasilar crackles, no wheezing, rhonchi or rales  Abd: soft, nontender, no hepatomegaly  Ext: no edema Musculoskeletal:  No deformities, BUE and BLE strength normal and equal Skin: warm and dry  Neuro:  CNs 2-12 intact, no focal abnormalities noted Psych:  Normal affect    EKG:  The ECG that was done today was personally reviewed and  demonstrates intermittent A pacing  Relevant CV Studies:  ECHO: 07/19/2016 - Left ventricle: The cavity size was normal. There was severe   concentric hypertrophy. Systolic function was vigorous. The   estimated ejection fraction was in the range of 65% to 70%. Wall   motion was normal; there were no regional wall motion   abnormalities. Features are consistent with a pseudonormal left   ventricular filling pattern, with concomitant abnormal relaxation   and increased filling pressure (grade 2 diastolic dysfunction).   Doppler parameters are consistent with elevated ventricular   end-diastolic filling pressure. - Mitral valve: There was mild regurgitation. - Left atrium: The atrium was severely dilated. - Right ventricle: Systolic function was normal. - Right atrium: The atrium was mildly dilated. Impressions: - There is severe left ventricular hypertrophy predominantly of the   mid and apical segments highly suspicious of apical form of   hypertrophic cardiomyopathy. Intracavitary gradient is 40 mmHg.   A cardiac MRI is recommended for further evaluation.  Laboratory Data:  Chemistry Recent Labs  Lab 06/10/17 1309  NA 136  K 3.7  CL 107  CO2 19*  GLUCOSE 207*  BUN 44*  CREATININE 2.86*  CALCIUM 9.4  GFRNONAA 21*  GFRAA 25*  ANIONGAP 10    Recent Labs  Lab 06/10/17 1309  PROT 8.1  ALBUMIN 4.2  AST 25  ALT 18  ALKPHOS 69  BILITOT 1.5*   Hematology Recent Labs  Lab 06/10/17 1309  WBC 6.3  RBC 4.68  HGB 12.6*  HCT 37.7*  MCV 80.6  MCH 26.9  MCHC 33.4  RDW 16.2*  PLT 121*   Cardiac EnzymesNo results for input(s): TROPONINI in the last 168 hours.  Recent Labs  Lab 06/10/17 1339  TROPIPOC 0.14*    BNPNo results for input(s): BNP, PROBNP in the last 168 hours.  DDimer No results for  input(s): DDIMER in the last 168 hours.  Radiology/Studies:  Ct Abdomen Pelvis Wo Contrast  Result Date: 06/10/2017 CLINICAL DATA:  Abdominal pain and diarrhea this  morning. EXAM: CT ABDOMEN AND PELVIS WITHOUT CONTRAST TECHNIQUE: Multidetector CT imaging of the abdomen and pelvis was performed following the standard protocol without IV contrast. COMPARISON:  09/05/2016 FINDINGS: Lower chest: Stable cardiomegaly with right atrial pacer and right ventricular defibrillator leads in place. Coronary arteriosclerosis is identified. No pericardial effusion. No active pulmonary disease. Hepatobiliary: Status post cholecystectomy. The unenhanced liver demonstrates no biliary ductal dilatation. Slightly nodular liver contour re- demonstrated suspicious for morphologic change of cirrhosis. Pancreas: No acute appearing abnormality involving the unenhanced pancreas. No ductal dilatation or definite mass. Spleen: No splenomegaly or mass. Adrenals/Urinary Tract: Normal bilateral adrenal glands. Re- demonstration of a nonobstructing right lower pole 3 mm calculus. No hydronephrosis nor hydroureter. The urinary bladder is physiologically distended. Stomach/Bowel: Decompressed stomach. Normal small bowel rotation without obstruction or inflammation. No acute bowel inflammation. A small amount of liquid stool along the descending colon may reflect diarrheal disease. Vascular/Lymphatic: Aorto bi-iliac and branch vessel atherosclerosis without aneurysm. No retroperitoneal or mesenteric lymphadenopathy. Reproductive: The prostate gland and seminal vesicles are unremarkable. Other: No free air. Tiny fat containing umbilical hernia. No abdominopelvic ascites. Musculoskeletal: Degenerative disc disease L5-S1 with minimal grade 1 anterolisthesis. No acute nor suspicious osseous abnormalities. IMPRESSION: 1. No acute bowel obstruction or inflammation. Liquid stool in the descending colon likely representing diarrheal disease. 2. Stable cardiomegaly. 3. Morphologic appearance of the liver suggest cirrhosis as before. 4. Aortoiliac and branch vessel atherosclerosis.  No aneurysm. 5. 3 mm lower pole right  renal calculus without obstruction. Electronically Signed   By: Ashley Royalty M.D.   On: 06/10/2017 14:56   Ct Head Wo Contrast  Result Date: 06/10/2017 CLINICAL DATA:  67 year old male with a history of abdominal pain and syncope EXAM: CT HEAD WITHOUT CONTRAST TECHNIQUE: Contiguous axial images were obtained from the base of the skull through the vertex without intravenous contrast. COMPARISON:  07/17/2016, 11/28/2012 FINDINGS: Brain: No acute intracranial hemorrhage. No midline shift or mass effect. Gray-white differentiation maintained. Confluent hypodensity in the periventricular white matter, similar to the prior. Vascular: Calcifications of intracranial vasculature. Skull: No acute fracture.  No aggressive bony lesion. Sinuses/Orbits: Unremarkable appearance of the orbits. No paranasal sinus disease. No mastoid effusion. Other: None IMPRESSION: Head CT negative for acute abnormality. Evidence of chronic microvascular ischemic disease. Electronically Signed   By: Corrie Mckusick D.O.   On: 06/10/2017 14:48    Assessment and Plan:   Principal Problem: 1.  Syncope - possibly orthostatic, will gently hydrate - ck UDS - get device interrogated - ck echo - further plans once above data reviewed.   Active Problems: 2.  Diabetes mellitus type 2, insulin dependent ([place]) - per dtr, dietary compliance is poor - continue home rx plus SSI  3.  Coronary artery disease - Per pt, has a stent - not on ASA or Plavix due to bleeding - continue BB, statin  4.  Cardiac pacemaker in situ, MDT - will get device interrogated.  5. Acute renal failure - BUN/Cr well above baseline, although our labs are 86 months old - follow, hold Lasix and lisinopril for now.  Severity of Illness: The appropriate patient status for this patient is OBSERVATION. Observation status is judged to be reasonable and necessary in order to provide the required intensity of service to ensure the patient's safety. The patient's  presenting symptoms, physical exam  findings, and initial radiographic and laboratory data in the context of their medical condition is felt to place them at decreased risk for further clinical deterioration. Furthermore, it is anticipated that the patient will be medically stable for discharge from the hospital within 2 midnights of admission. The following factors support the patient status of observation.   " The patient's presenting symptoms include syncope. " The physical exam findings include a pacing at times. " The initial radiographic and laboratory data are elevated creatinine.  For questions or updates, please contact [place] Please consult [email or web address] for contact info under Cardiology/STEMI.   Signed, Rosaria Ferries, PA-C  06/10/2017 5:48 PM   The patient was seen, examined and discussed with Rosaria Ferries, PA-C and I agree with the above.   67 y.o. male with a history of HOCM, VT s/p MDT ICD, A flutter s/p ablation x 2, non-obs CAD cath 2008, pt reports a later cardiac stent (known exact date), heme+ stools when on Plavix and Eliquis, syncope prior to ICD, last ICD shock about a year ago. The patient is coming accompanied by his daughter who is very helpful in providing information about the patient. Patient is very somnolent and falling asleep during conversation.  Per daughter he was up all night for new years celebration and came home in the morning or JVD, when his to that he collapsed, daughter states that it took her about a minute to revive him. The patient denies any chest pain palpitation or ICD shocks prior to the syncopal episode. He admits to using marijuana and cocaine last night. He states that he doesn't use any alcohol.  Patient currently denies any chest pain, no shortness of breath, he is laying flat in bed and looking comfortable. He denies any recent ICD shock, no paroxysmal nocturnal dyspnea or orthopnea, no exertional dyspnea. Also denies fever or  chills.  Physical exam shows no JVDs, mild crackles at the lung bases, no lower extremity edema. His EKG shows intermittent atrial pacing with narrow complex QRS no acute ST-T wave abnormalities. His labs show potassium 3.7, creatinine 2.86 from baseline of 1.24-1.6, troponin 0.14, hemoglobin 12.6.  Plan: We will obtain ICD interrogation. We will hold Eliquis, however doubt this is presentation of ACS, troponin elevation most probably demand ischemia in the settings of cocaine use and acute on chronic kidney failure. Start heparin drip. We'll continue to cycle troponin. Admit to observation, the patient can most probably be discharged home tomorrow if no significant abnormality on ICD interrogation or significant troponin elevation. The patient appears dehydrated we will start careful hydration with IV fluids. We will obtain an echocardiogram.  Ena Dawley, MD 06/10/2017

## 2017-06-10 NOTE — ED Notes (Signed)
Pt aware we need urine for a specimen.  Pt was not able to provide a spec at this time

## 2017-06-10 NOTE — Progress Notes (Signed)
   Cardiology requested to obtain interrogation of pt MDT PPM. Message has been left for Trish to arrange this in am.  Rosaria Ferries, PA-C 06/10/2017 8:57 PM Beeper [phone]

## 2017-06-10 NOTE — ED Provider Notes (Signed)
[place] EMERGENCY DEPARTMENT Provider Note   CSN: [id number] Arrival date & time: 06/10/17  1123     History   Chief Complaint Chief Complaint  Patient presents with  . Loss of Consciousness    HPI Anthony Mcfarland is a 67 y.o. male.  The history is provided by the patient and medical records. No language interpreter was used.  Loss of Consciousness   Associated symptoms include abdominal pain (Yesterday, now resolved). Pertinent negatives include chest pain, nausea, palpitations and vomiting.   Anthony Mcfarland is a 67 y.o. male  with a PMH of CAD with prior stent placement, pacemaker in place followed by [facility] cardiology who presents to the Emergency Department for evaluation of syncopal episode this morning just prior to arrival.  Patient states that he has been having diarrhea over the last day or 2.  He got up to use the restroom.  He remembers taking 3-4 steps, then his next memory is having people wake him up.  He does not know what happened.  Family states that they heard a thump and went into the room.  He was not responding for about a minute, then came to and asked why he was on the floor.  No lightheadedness, dizziness, flushing prior.  Denies chest pain or trouble breathing.  Denies abdominal pain as well, but does state that he had abdominal pain described as cramping yesterday.  No nausea or vomiting.  He is on Eliquis which he has taken without any missed doses.  He is unsure if he hit her head or not during the fall.  He does not have a headache nor any arthralgias or myalgias.  Past Medical History:  Diagnosis Date  . Anemia in chronic illness 04/13/2014  . Atrial flutter [facility])    s/p ablation 03/2016 and 06/2016 at [facility].    Marland Kitchen Complication of anesthesia    takes awhile to wake up due to kidney and liver functions  . Coronary artery disease   . Diabetes mellitus without complication ([place])   . Gastrointestinal hemorrhage 07/18/2016  . Hepatitis  C    This was treated in late 2015 with anti-viral medications overseen at [facility]    Patient Active Problem List   Diagnosis Date Noted  . Bacteremia due to Gram-negative bacteria 09/05/2016  . Proteus septicemia ([place]) 09/05/2016  . C. difficile colitis 09/05/2016  . Hypomagnesemia 09/05/2016  . Sepsis secondary to UTI ([place]) 09/04/2016  . Intra-abdominal fluid collection - R flank ? abscess 09/04/2016  . Polyp of transverse colon   . Gastritis and gastroduodenitis   . Ascites   . Cirrhosis ([place])   . Thrombocytopenia ([place]) 04/13/2014  . Diabetes mellitus type 2, insulin dependent ([place]) 04/13/2014  . Anemia 04/13/2014  . Coronary artery disease   . Hyperglycemia 04/12/2014    Past Surgical History:  Procedure Laterality Date  . CARDIAC DEFIBRILLATOR PLACEMENT    . CHOLECYSTECTOMY    . COLONOSCOPY N/A 07/22/2016   Procedure: COLONOSCOPY;  Surgeon: Mauri Pole, MD;  Location: [facility] ENDOSCOPY;  Service: Endoscopy;  Laterality: N/A;  . ESOPHAGOGASTRODUODENOSCOPY N/A 07/19/2016   Procedure: ESOPHAGOGASTRODUODENOSCOPY (EGD);  Surgeon: Milus Banister, MD;  Location: [facility];  Service: Endoscopy;  Laterality: N/A;  . kidney stent Right 2018  . right leg ankle surgery         Home Medications    Prior to Admission medications   Medication Sig Start Date End Date Taking? Authorizing Provider  acetaminophen (TYLENOL)  325 MG tablet Take 650 mg by mouth every 6 (six) hours as needed for moderate pain.   Yes [provider]  amLODipine (NORVASC) 10 MG tablet Take 10 mg by mouth daily.   Yes [provider]  apixaban (ELIQUIS) 5 MG TABS tablet Take 5 mg by mouth 2 (two) times daily.   Yes [provider]  atorvastatin (LIPITOR) 20 MG tablet Take 10 mg by mouth at bedtime.   Yes [provider]  carvedilol (COREG) 25 MG tablet Take 25 mg by mouth 2 (two) times daily with a meal.   Yes [provider]  cholecalciferol (VITAMIN D)  1000 units tablet Take 2,000 Units by mouth daily.   Yes [provider]  ferrous sulfate 324 (65 Fe) MG TBEC Take 324 mg by mouth daily.   Yes [provider]  furosemide (LASIX) 20 MG tablet Take 20 mg by mouth daily.    Yes [provider]  gabapentin (NEURONTIN) 300 MG capsule Take 1 capsule (300 mg total) by mouth at bedtime. Patient taking differently: Take 600 mg by mouth 2 (two) times daily.  04/14/14  Yes Rai, Ripudeep K, MD  insulin glargine (LANTUS) 100 UNIT/ML injection Inject 0.3 mLs (30 Units total) into the skin at bedtime. Patient taking differently: Inject 20 Units into the skin at bedtime.  04/14/14  Yes Rai, Ripudeep K, MD  ketoconazole (NIZORAL) 2 % cream Apply 1 application topically at bedtime. Apply to feet and between toes   Yes [provider]  levothyroxine (SYNTHROID, LEVOTHROID) 88 MCG tablet Take 88 mcg by mouth daily.    Yes [provider]  lisinopril (PRINIVIL,ZESTRIL) 20 MG tablet Take 20 mg by mouth daily.   Yes [provider]  Skin Protectants, Misc. (MINERIN) CREA Apply 1 application topically 2 (two) times daily as needed (itching/dry skin).   Yes [provider]  terazosin (HYTRIN) 2 MG capsule Take 2 mg by mouth at bedtime.    Yes [provider]  clopidogrel (PLAVIX) 75 MG tablet Take 1 tablet (75 mg total) by mouth daily. Patient not taking: Reported on 06/10/2017 07/29/16   Reyne Dumas, MD  Multiple Vitamin (MULTIVITAMIN WITH MINERALS) TABS tablet Take 1 tablet by mouth daily. Patient not taking: Reported on 06/10/2017 09/09/16   Kerney Elbe, DO    Family History Family History  Problem Relation Age of Onset  . Cancer Mother        colon cancer    Social History Social History   Tobacco Use  . Smoking status: Never Smoker  . Smokeless tobacco: Never Used  Substance Use Topics  . Alcohol use: No  . Drug use: Yes    Types: Marijuana     Allergies   Grapefruit  extract; Other; and Amoxicillin   Review of Systems Review of Systems  Cardiovascular: Positive for syncope. Negative for chest pain, palpitations and leg swelling.  Gastrointestinal: Positive for abdominal pain (Yesterday, now resolved) and diarrhea. Negative for blood in stool, nausea and vomiting.  All other systems reviewed and are negative.    Physical Exam Updated Vital Signs BP 124/74   Pulse 61   Temp 97.8 F (36.6 C) (Oral)   Resp 20   Ht 6\' 1"  (1.854 m)   Wt 83.1 kg (183 lb 2 oz)   SpO2 94%   BMI 24.16 kg/m   Physical Exam  Constitutional: He is oriented to person, place, and time. He appears well-developed and well-nourished. No distress.  HENT:  Head: Normocephalic and atraumatic.  Neck: Neck supple. No JVD present.  No midline or paraspinal tenderness.  Cardiovascular: Normal rate, regular rhythm and normal heart sounds.  No murmur heard. Pulmonary/Chest: Effort normal and breath sounds normal. No respiratory distress.  Abdominal: Soft. Bowel sounds are normal. He exhibits no distension.  No abdominal tenderness.  Musculoskeletal: Normal range of motion. He exhibits no edema.  Neurological: He is alert and oriented to person, place, and time.  Speech clear and goal oriented. CN 2-12 grossly intact. Normal finger-to-nose and rapid alternating movements. No drift. Strength and sensation intact.  Skin: Skin is warm and dry.  Nursing note and vitals reviewed.    ED Treatments / Results  Labs (all labs ordered are listed, but only abnormal results are displayed) Labs Reviewed  CBC WITH DIFFERENTIAL/PLATELET - Abnormal; Notable for the following components:      Result Value   Hemoglobin 12.6 (*)    HCT 37.7 (*)    RDW 16.2 (*)    Platelets 121 (*)    All other components within normal limits  COMPREHENSIVE METABOLIC PANEL - Abnormal; Notable for the following components:   CO2 19 (*)    Glucose, Bld 207 (*)    BUN 44 (*)    Creatinine, Ser 2.86 (*)      Total Bilirubin 1.5 (*)    GFR calc non Af Amer 21 (*)    GFR calc Af Amer 25 (*)    All other components within normal limits  I-STAT TROPONIN, ED - Abnormal; Notable for the following components:   Troponin i, poc 0.14 (*)    All other components within normal limits    EKG  EKG Interpretation  Date/Time:  Tuesday June 10 2017 11:41:15 EST Ventricular Rate:  61 PR Interval:    QRS Duration: 109 QT Interval:  478 QTC Calculation: 482 R Axis:   -2 Text Interpretation:  Atrial-paced complexes Prolonged PR interval Abnormal R-wave progression, late transition Probable left ventricular hypertrophy Abnrm T, consider ischemia, anterolateral lds paced rhythm new from previous Confirmed by Theotis Burrow [phone]) on 06/10/2017 11:45:14 AM       Radiology Ct Abdomen Pelvis Wo Contrast  Result Date: 06/10/2017 CLINICAL DATA:  Abdominal pain and diarrhea this morning. EXAM: CT ABDOMEN AND PELVIS WITHOUT CONTRAST TECHNIQUE: Multidetector CT imaging of the abdomen and pelvis was performed following the standard protocol without IV contrast. COMPARISON:  09/05/2016 FINDINGS: Lower chest: Stable cardiomegaly with right atrial pacer and right ventricular defibrillator leads in place. Coronary arteriosclerosis is identified. No pericardial effusion. No active pulmonary disease. Hepatobiliary: Status post cholecystectomy. The unenhanced liver demonstrates no biliary ductal dilatation. Slightly nodular liver contour re- demonstrated suspicious for morphologic change of cirrhosis. Pancreas: No acute appearing abnormality involving the unenhanced pancreas. No ductal dilatation or definite mass. Spleen: No splenomegaly or mass. Adrenals/Urinary Tract: Normal bilateral adrenal glands. Re- demonstration of a nonobstructing right lower pole 3 mm calculus. No hydronephrosis nor hydroureter. The urinary bladder is physiologically distended. Stomach/Bowel: Decompressed stomach. Normal small bowel rotation without  obstruction or inflammation. No acute bowel inflammation. A small amount of liquid stool along the descending colon may reflect diarrheal disease. Vascular/Lymphatic: Aorto bi-iliac and branch vessel atherosclerosis without aneurysm. No retroperitoneal or mesenteric lymphadenopathy. Reproductive: The prostate gland and seminal vesicles are unremarkable. Other: No free air. Tiny fat containing umbilical hernia. No abdominopelvic ascites. Musculoskeletal: Degenerative disc disease L5-S1 with minimal grade 1 anterolisthesis. No acute nor suspicious osseous abnormalities. IMPRESSION: 1. No acute  bowel obstruction or inflammation. Liquid stool in the descending colon likely representing diarrheal disease. 2. Stable cardiomegaly. 3. Morphologic appearance of the liver suggest cirrhosis as before. 4. Aortoiliac and branch vessel atherosclerosis.  No aneurysm. 5. 3 mm lower pole right renal calculus without obstruction. Electronically Signed   By: Ashley Royalty M.D.   On: 06/10/2017 14:56   Ct Head Wo Contrast  Result Date: 06/10/2017 CLINICAL DATA:  67 year old male with a history of abdominal pain and syncope EXAM: CT HEAD WITHOUT CONTRAST TECHNIQUE: Contiguous axial images were obtained from the base of the skull through the vertex without intravenous contrast. COMPARISON:  07/17/2016, 11/28/2012 FINDINGS: Brain: No acute intracranial hemorrhage. No midline shift or mass effect. Gray-white differentiation maintained. Confluent hypodensity in the periventricular white matter, similar to the prior. Vascular: Calcifications of intracranial vasculature. Skull: No acute fracture.  No aggressive bony lesion. Sinuses/Orbits: Unremarkable appearance of the orbits. No paranasal sinus disease. No mastoid effusion. Other: None IMPRESSION: Head CT negative for acute abnormality. Evidence of chronic microvascular ischemic disease. Electronically Signed   By: Corrie Mckusick D.O.   On: 06/10/2017 14:48    Procedures Procedures  (including critical care time)  Medications Ordered in ED Medications - No data to display   Initial Impression / Assessment and Plan / ED Course  I have reviewed the triage vital signs and the nursing notes.  Pertinent labs & imaging results that were available during my care of the patient were reviewed by me and considered in my medical decision making (see chart for details).    Anthony Mcfarland is a 67 y.o. male who presents to ED for evaluation following syncopal episode with no prodromal symptoms just prior to arrival. Hx of CAD with stent placement and pacemaker placement followed by [facility] cardiology. He has also had diarrhea over the last day or two. Currently with no abdominal pain, nausea or vomiting. No fevers or chills. No abdominal tenderness on exam. On Eliquis and unsure if he hit his head. No focal neuro deficits. CT head negative. Creatinine up at 2.86. CT abd with liquid stool in descending colon likely diarrheal disease otherwise no acute findings. Troponin of 0.14. EKG with paced rhythm which is new as he had pacemaker placed, but no other changes. Cardiology was consulted who will evaluate patient. Anticipate admission.   Patient discussed with Dr. Rex Kras who agrees with treatment plan.   Final Clinical Impressions(s) / ED Diagnoses   Final diagnoses:  Syncope, unspecified syncope type  Elevated troponin    ED Discharge Orders    None       Ward, Ozella Almond, PA-C 06/10/17 1656    Little, Wenda Overland, MD 06/13/17 [phone]

## 2017-06-10 NOTE — ED Notes (Signed)
Daughter Malon Branton, [phone] cell, [phone] home.

## 2017-06-10 NOTE — Progress Notes (Signed)
ANTICOAGULATION CONSULT NOTE - Initial Consult  Pharmacy Consult for Heparin Indication: chest pain/ACS  Allergies  Allergen Reactions  . Grapefruit Extract Other (See Comments)    [facility] told him not to eat  . Other Other (See Comments)    Reaction to  Unknown anesthesia at [facility] hospital January 2018 - trouble waking up  . Amoxicillin Other (See Comments)    Unknown reaction - can tolerate Cephalosporins Has patient had a PCN reaction causing immediate rash, facial/tongue/throat swelling, SOB or lightheadedness with hypotension: unknown Has patient had a PCN reaction causing severe rash involving mucus membranes or skin necrosis: unknown Has patient had a PCN reaction that required hospitalization: unknown Has patient had a PCN reaction occurring within the last 10 years: unknown If all of the above answers are "NO", then may proceed with Cephalosporin    Patient Measurements: Height: 6\' 1"  (185.4 cm) Weight: 183 lb 2 oz (83.1 kg) IBW/kg (Calculated) : 79.9 Heparin Dosing Weight: 83.1 kg  Vital Signs: Temp: 97.8 F (36.6 C) (01/01 1144) Temp Source: Oral (01/01 1144) BP: 110/63 (01/01 1715) Pulse Rate: 60 (01/01 1715)  Labs: Recent Labs    06/10/17 1309  HGB 12.6*  HCT 37.7*  PLT 121*  CREATININE 2.86*    Estimated Creatinine Clearance: 28.3 mL/min (A) (by C-G formula based on SCr of 2.86 mg/dL (H)).   Medical History: Past Medical History:  Diagnosis Date  . Anemia in chronic illness 04/13/2014  . Atrial flutter [facility])    s/p ablation 03/2016 and 06/2016 at [facility].    Marland Kitchen Cardiac pacemaker in situ, MDT 06/10/2017  . Complication of anesthesia    takes awhile to wake up due to kidney and liver functions  . Coronary artery disease   . Diabetes mellitus without complication ([place])   . Gastrointestinal hemorrhage 07/18/2016  . Hepatitis C    This was treated in late 2015 with anti-viral medications overseen at [facility].  Marland Kitchen Syncope 06/10/2017   Assessment: 10 yoM  presents s/p syncope and fall with history of aflutter on Eliquis PTA. Last dose of Eliquis 1/1 @ ~0800-0900. Pharmacy consulted to dose heparin for r/o ACS/STEMI. Hgb 12.6, pltc 121. No bleeding noted. Will dose by aPTT until aPTT and heparin levels correlate.  Goal of Therapy:  Heparin level 0.3-0.7 units/ml Monitor platelets by anticoagulation protocol: Yes   Plan:  No heparin bolus Start heparin infusion at 1000 units/hr Check aPTT in 8 hours  Daily aPTT/heparin level, CBC Monitor H&H and platelets, s/sx of bleeding  Anthony Mcfarland N. Gerarda Fraction, PharmD PGY1 Pharmacy Resident Pager: [phone] 06/10/2017,5:51 PM

## 2017-06-10 NOTE — ED Notes (Signed)
Notified pharmacy for medications 

## 2017-06-10 NOTE — ED Triage Notes (Signed)
Per EMS:  pt from home with c/o abdominal pain and diarrhea.  Pt took a few steps toward the bathroom and had a syncopal episode.  Pt unaware as to hitting his head.  Small skin tear on left elbow.  Pta vitals: BP 115/64, HR 60, Cbg 221, SP02 96 RA.

## 2017-06-11 ENCOUNTER — Other Ambulatory Visit: Payer: Self-pay

## 2017-06-11 ENCOUNTER — Encounter (HOSPITAL_COMMUNITY): Payer: Self-pay | Admitting: General Practice

## 2017-06-11 ENCOUNTER — Inpatient Hospital Stay (HOSPITAL_COMMUNITY): Payer: Medicare Other

## 2017-06-11 DIAGNOSIS — I361 Nonrheumatic tricuspid (valve) insufficiency: Secondary | ICD-10-CM

## 2017-06-11 LAB — COMPREHENSIVE METABOLIC PANEL
ALBUMIN: 3.6 g/dL (ref 3.5–5.0)
ALK PHOS: 59 U/L (ref 38–126)
ALT: 16 U/L — ABNORMAL LOW (ref 17–63)
AST: 23 U/L (ref 15–41)
Anion gap: 8 (ref 5–15)
BILIRUBIN TOTAL: 1.1 mg/dL (ref 0.3–1.2)
BUN: 43 mg/dL — AB (ref 6–20)
CALCIUM: 9 mg/dL (ref 8.9–10.3)
CO2: 19 mmol/L — ABNORMAL LOW (ref 22–32)
CREATININE: 2.58 mg/dL — AB (ref 0.61–1.24)
Chloride: 112 mmol/L — ABNORMAL HIGH (ref 101–111)
GFR calc Af Amer: 28 mL/min — ABNORMAL LOW (ref >60)
GFR, EST NON AFRICAN AMERICAN: 24 mL/min — AB (ref >60)
GLUCOSE: 116 mg/dL — AB (ref 65–99)
Potassium: 3.5 mmol/L (ref 3.5–5.1)
Sodium: 139 mmol/L (ref 135–145)
TOTAL PROTEIN: 7.3 g/dL (ref 6.5–8.1)

## 2017-06-11 LAB — GLUCOSE, CAPILLARY
Glucose-Capillary: 162 mg/dL — ABNORMAL HIGH (ref 65–99)
Glucose-Capillary: 199 mg/dL — ABNORMAL HIGH (ref 65–99)

## 2017-06-11 LAB — CBC
HEMATOCRIT: 34 % — AB (ref 39.0–52.0)
HEMOGLOBIN: 11.1 g/dL — AB (ref 13.0–17.0)
MCH: 26.3 pg (ref 26.0–34.0)
MCHC: 32.6 g/dL (ref 30.0–36.0)
MCV: 80.6 fL (ref 78.0–100.0)
Platelets: 104 10*3/uL — ABNORMAL LOW (ref 150–400)
RBC: 4.22 MIL/uL (ref 4.22–5.81)
RDW: 16.2 % — AB (ref 11.5–15.5)
WBC: 4.8 10*3/uL (ref 4.0–10.5)

## 2017-06-11 LAB — ECHOCARDIOGRAM COMPLETE
Height: 73 in
Weight: 2930 oz

## 2017-06-11 LAB — CBG MONITORING, ED: GLUCOSE-CAPILLARY: 107 mg/dL — AB (ref 65–99)

## 2017-06-11 LAB — APTT
aPTT: 68 seconds — ABNORMAL HIGH (ref 24–36)
aPTT: 69 seconds — ABNORMAL HIGH (ref 24–36)

## 2017-06-11 LAB — TROPONIN I
Troponin I: 0.26 ng/mL (ref <0.03)
Troponin I: 0.27 ng/mL (ref <0.03)

## 2017-06-11 MED ORDER — FERROUS SULFATE 325 (65 FE) MG PO TABS
325.0000 mg | ORAL_TABLET | Freq: Every day | ORAL | Status: DC
  Administered 2017-06-11 – 2017-06-12 (×2): 325 mg via ORAL
  Filled 2017-06-11 (×2): qty 1

## 2017-06-11 MED ORDER — INSULIN ASPART 100 UNIT/ML ~~LOC~~ SOLN
0.0000 [IU] | Freq: Three times a day (TID) | SUBCUTANEOUS | Status: DC
  Administered 2017-06-12: 2 [IU] via SUBCUTANEOUS

## 2017-06-11 NOTE — ED Notes (Signed)
Attempted report 

## 2017-06-11 NOTE — Progress Notes (Signed)
*   Echocardiogram 2D Echocardiogram has been performed.  Anthony Mcfarland 06/11/2017, 9:36 AM

## 2017-06-11 NOTE — Progress Notes (Signed)
ANTICOAGULATION CONSULT NOTE - Follow Up Consult  Pharmacy Consult for heparin Indication: CP and Aflutter  Labs: Recent Labs    06/10/17 1309 06/10/17 1927 06/11/17 0024  HGB 12.6*  --   --   HCT 37.7*  --   --   PLT 121*  --   --   APTT  --   --  69*  CREATININE 2.86*  --   --   TROPONINI  --  0.26*  --     Assessment/Plan:  67yo male therapeutic on heparin with initial dosing while Eliquis on hold. Will continue gtt at current rate and confirm stable with additional PTT.   Wynona Neat, PharmD, BCPS  06/11/2017,1:08 AM

## 2017-06-11 NOTE — Progress Notes (Addendum)
Progress Note  Patient Name: Anthony Mcfarland Date of Encounter: 06/11/2017  [facility]  Subjective   Patient is feeling well; denies chest pain, SOB, and palpitations.  Inpatient Medications    Scheduled Meds: . amLODipine  10 mg Oral Daily  . atorvastatin  10 mg Oral QHS  . carvedilol  25 mg Oral BID WC  . cholecalciferol  2,000 Units Oral Daily  . ferrous sulfate  325 mg Oral Q breakfast  . insulin glargine  20 Units Subcutaneous QHS  . ketoconazole  1 application Topical QHS  . levothyroxine  88 mcg Oral QAC breakfast  . terazosin  2 mg Oral QHS   Continuous Infusions: . sodium chloride 50 mL/hr at 06/10/17 1842  . heparin 1,000 Units/hr (06/10/17 1841)   PRN Meds: acetaminophen, ALPRAZolam, nitroGLYCERIN, ondansetron (ZOFRAN) IV, zolpidem   Vital Signs    Vitals:   06/11/17 0400 06/11/17 0500 06/11/17 0600 06/11/17 0700  BP: 118/73 122/68 126/68 131/77  Pulse: (!) 58 61 60 (!) 59  Resp: 20 (!) 7 11 12   Temp:      TempSrc:      SpO2: 91% 92% 98% 100%  Weight:      Height:        Intake/Output Summary (Last 24 hours) at 06/11/2017 0749 Last data filed at 06/11/2017 9528 Gross per 24 hour  Intake -  Output 400 ml  Net -400 ml   Filed Weights   06/10/17 1144  Weight: 183 lb 2 oz (83.1 kg)     Physical Exam   General: Well developed, well nourished, male appearing in no acute distress. Head: Normocephalic, atraumatic.  Neck: Supple without bruits, no JVD. Lungs:  Resp regular and unlabored, CTA. Heart: RRR, S1, S2, + systolic murmur; no rub. Abdomen: Soft, non-tender, non-distended with normoactive bowel sounds. No hepatomegaly. No rebound/guarding. No obvious abdominal masses. Extremities: No clubbing, cyanosis, trace edema. Distal pedal pulses are 2+ bilaterally. Neuro: Alert and oriented X 3. Moves all extremities spontaneously. Psych: Normal affect.  Labs    Chemistry Recent Labs  Lab 06/10/17 1309 06/11/17 0630  NA 136 139    K 3.7 3.5  CL 107 112*  CO2 19* 19*  GLUCOSE 207* 116*  BUN 44* 43*  CREATININE 2.86* 2.58*  CALCIUM 9.4 9.0  PROT 8.1 7.3  ALBUMIN 4.2 3.6  AST 25 23  ALT 18 16*  ALKPHOS 69 59  BILITOT 1.5* 1.1  GFRNONAA 21* 24*  GFRAA 25* 28*  ANIONGAP 10 8     Hematology Recent Labs  Lab 06/10/17 1309 06/11/17 0630  WBC 6.3 4.8  RBC 4.68 4.22  HGB 12.6* 11.1*  HCT 37.7* 34.0*  MCV 80.6 80.6  MCH 26.9 26.3  MCHC 33.4 32.6  RDW 16.2* 16.2*  PLT 121* PENDING    Cardiac Enzymes Recent Labs  Lab 06/10/17 1927 06/11/17 0020 06/11/17 0630  TROPONINI 0.26* 0.26* 0.27*    Recent Labs  Lab 06/10/17 1339  TROPIPOC 0.14*     BNPNo results for input(s): BNP, PROBNP in the last 168 hours.   DDimer No results for input(s): DDIMER in the last 168 hours.   Radiology    Ct Abdomen Pelvis Wo Contrast  Result Date: 06/10/2017 CLINICAL DATA:  Abdominal pain and diarrhea this morning. EXAM: CT ABDOMEN AND PELVIS WITHOUT CONTRAST TECHNIQUE: Multidetector CT imaging of the abdomen and pelvis was performed following the standard protocol without IV contrast. COMPARISON:  09/05/2016 FINDINGS: Lower chest: Stable cardiomegaly with right atrial  pacer and right ventricular defibrillator leads in place. Coronary arteriosclerosis is identified. No pericardial effusion. No active pulmonary disease. Hepatobiliary: Status post cholecystectomy. The unenhanced liver demonstrates no biliary ductal dilatation. Slightly nodular liver contour re- demonstrated suspicious for morphologic change of cirrhosis. Pancreas: No acute appearing abnormality involving the unenhanced pancreas. No ductal dilatation or definite mass. Spleen: No splenomegaly or mass. Adrenals/Urinary Tract: Normal bilateral adrenal glands. Re- demonstration of a nonobstructing right lower pole 3 mm calculus. No hydronephrosis nor hydroureter. The urinary bladder is physiologically distended. Stomach/Bowel: Decompressed stomach. Normal small  bowel rotation without obstruction or inflammation. No acute bowel inflammation. A small amount of liquid stool along the descending colon may reflect diarrheal disease. Vascular/Lymphatic: Aorto bi-iliac and branch vessel atherosclerosis without aneurysm. No retroperitoneal or mesenteric lymphadenopathy. Reproductive: The prostate gland and seminal vesicles are unremarkable. Other: No free air. Tiny fat containing umbilical hernia. No abdominopelvic ascites. Musculoskeletal: Degenerative disc disease L5-S1 with minimal grade 1 anterolisthesis. No acute nor suspicious osseous abnormalities. IMPRESSION: 1. No acute bowel obstruction or inflammation. Liquid stool in the descending colon likely representing diarrheal disease. 2. Stable cardiomegaly. 3. Morphologic appearance of the liver suggest cirrhosis as before. 4. Aortoiliac and branch vessel atherosclerosis.  No aneurysm. 5. 3 mm lower pole right renal calculus without obstruction. Electronically Signed   By: Ashley Royalty M.D.   On: 06/10/2017 14:56   Dg Chest 2 View  Result Date: 06/10/2017 CLINICAL DATA:  Abdominal pain and diarrhea. Syncopal episode today. EXAM: CHEST  2 VIEW COMPARISON:  09/04/2016 FINDINGS: Cardiac pacemaker. Shallow inspiration. Cardiac enlargement without vascular congestion. No edema or consolidation. No blunting of costophrenic angles. No pneumothorax. Linear scarring in the left mid lung is unchanged. Coronary artery stents. Calcification of the aorta. IMPRESSION: Cardiac enlargement. No evidence of vascular congestion, edema, or consolidation in the lungs. Aortic atherosclerosis. Electronically Signed   By: Lucienne Capers M.D.   On: 06/10/2017 18:27   Ct Head Wo Contrast  Result Date: 06/10/2017 CLINICAL DATA:  67 year old male with a history of abdominal pain and syncope EXAM: CT HEAD WITHOUT CONTRAST TECHNIQUE: Contiguous axial images were obtained from the base of the skull through the vertex without intravenous contrast.  COMPARISON:  07/17/2016, 11/28/2012 FINDINGS: Brain: No acute intracranial hemorrhage. No midline shift or mass effect. Gray-white differentiation maintained. Confluent hypodensity in the periventricular white matter, similar to the prior. Vascular: Calcifications of intracranial vasculature. Skull: No acute fracture.  No aggressive bony lesion. Sinuses/Orbits: Unremarkable appearance of the orbits. No paranasal sinus disease. No mastoid effusion. Other: None IMPRESSION: Head CT negative for acute abnormality. Evidence of chronic microvascular ischemic disease. Electronically Signed   By: Corrie Mckusick D.O.   On: 06/10/2017 14:48     Telemetry    sinus - Personally Reviewed  ECG    No new tracings - Personally Reviewed  Cardiac Studies   Echo pending  ICD interrogation:  No events recorded  Patient Profile     67 y.o. male with a history of HOCM, VT s/p MDT ICD, A flutter s/p ablation x 2, non-obs CAD cath 2008, pt reports a later cardiac stent, heme+ stools when on Plavix and Eliquis, syncope prior to ICD  Assessment & Plan    1. Syncope, ICD in place - echo pending - device interrogation revealed normal function with no events recorded - UDS positive for cocaine - echo pending, if unchanged from previous, plan for D/C home without medication changes - counseled against cocaine use - follow up with [place]  as needed  2. Troponin elevation, nonobstructive CAD by cath 2008 - troponin: 0.26 --> 0.26 --> 0.27 - remained low and flat, inconsistent with ACS picture, favor demand ischemia in the setting of cocaine and syncope - EKG unchanged from prior - per patient, he has a stent - no ASA or plavix d/ bleeding in the past - continue BB, statin  3. HOCM - gentle hydration overnight - echo pending   4. DM - continue home regimen, discussed importance of dietary compliance  5. AKI - sCr 2.58 (2.86) - continue gentle hydration - holding lasix and lisinporil today - recommend  repeat BMP in 1 week with PCP - discharge on home lasix, resume lisinopril when AKI resolved   6. Hx of Aflutter - on eliquis - no active bleeding    Signed, Ledora Bottcher , PA-C 7:49 AM 06/11/2017 Pager: [phone]  I have personally seen and examined this patient with Doreene Adas, PA. I agree with the assessment and plan as outlined above. He is admitted with a syncopal event after a long night of partying, cocaine use. Echo shows normal LV function with known HOCM. ICD interrogation is ok. He has mild troponin elevation with flat trend, likely demand ischemia. He is feeling better today. No worrisome findings on exam. He does have acute worsening of his renal function on admit. This is improving but creatinine is still 2.5. We will continue to hydrate today and if his renal function continues to improve, he can be discharged home tomorrow.   Lauree Chandler 06/11/2017 11:10 AM

## 2017-06-11 NOTE — Discharge Summary (Signed)
Discharge Summary    Patient ID: Anthony Mcfarland,  MRN: [id number], DOB/AGE: [birth year] 67 y.o.  Admit date: 06/10/2017 Discharge date: 06/12/2017   Primary Care Provider: Ronnie Doss [facility]  Discharge Diagnoses    Principal Problem:   Syncope Active Problems:   Diabetes mellitus type 2, insulin dependent ([place])   Coronary artery disease   Cardiac pacemaker in situ, MDT   AKI (acute kidney injury) ([place])   Allergies Allergies  Allergen Reactions  . Grapefruit Extract Other (See Comments)    [facility] told him not to eat  . Other Other (See Comments)    Reaction to  Unknown anesthesia at [facility] hospital January 2018 - trouble waking up  . Amoxicillin Other (See Comments)    Unknown reaction - can tolerate Cephalosporins Has patient had a PCN reaction causing immediate rash, facial/tongue/throat swelling, SOB or lightheadedness with hypotension: unknown Has patient had a PCN reaction causing severe rash involving mucus membranes or skin necrosis: unknown Has patient had a PCN reaction that required hospitalization: unknown Has patient had a PCN reaction occurring within the last 10 years: unknown If all of the above answers are "NO", then may proceed with Cephalosporin     History of Present Illness     Anthony Mcfarland is a 67 y.o. male with a history of HOCM, VT s/p MDT ICD, A flutter s/p ablation x 2, non-obs CAD cath 2008, pt reports a later cardiac stent, heme+ stools when on Plavix and Eliquis, syncope prior to ICD  Anthony Mcfarland was up all night because it was [place] and got home about 5 am. He denies ETOH use, admits to [facility] use, cocaine use. He sat up and was watching TV. He got up out of the chair at 11 am, felt light-headed and dizzy when he stood up. He was on his way to the [place] and took a few steps. He woke up on the floor. His L elbow hurt. It is scraped. No other sig injuries.  He was not rousable for about a minute according to his daughter, who  was present.    At first his stomach was hurting, he was incontinent of bowel. After his BM, his stomach felt better.   He did not have chest pain at any time. He was not SOB, no N&V or diaphoretic.   The last time he passed out was years ago.   His family called 24. His CBG was 199 or more. That is very unusual for him.   He took his am meds but did not eat.     Hospital Course     Consultants: none  He presented to [facility] and was seen by cardiology. Initial troponin was mildly positive. He was admitted for observation overnight with heparin drip. Troponins remained low and flat, inconsistent with ACS; likely representing demand ischemia in the setting of cocaine use, dehydration, and syncope. Troponins: 0.26 --> 0.26 --> 0.27. EKG unchanged from prior. Continue BB and statin. No ASA because of previous bleeding. Continue plavix.  ICD interrogated with no events recorded; normal function and adequate battery life. Echo revealed stable LVEF, grade 2 DD, but severe left ventricular hypertrophy including apical segments, additional imaging with cardiac MRI to evaluate for a possible hypertrophic vs infiltrative cardiomyopathy is recommended. He can have this followed at the [place].   HOCM - gentle hydration overnight, echo as above. This is a chronic, stable problem.  Hx of atrial flutter s/p ablation x 2  Eliquis was briefly held on admission pending cardiology consult and decision for ischemic evaluation. Initially decreased eliquis given his renal function. However, renal function improving, so we will keep him at 5 mg BID.  AKI Serum creatinine significantly elevated: 2.58 (2.86) - improved with gentle hydration. Pt continued to make urine. He was observed overnight with additional hydration with improvement in his creatinine to 1.81.  Will continue holding lisinopril. Will restart home lasix. Pressures have been controlled this hospitalization on regimen without lisinopril. Recommend  repeat BMP with PCP or cardiology in 1 week and resume lisinopril.  Patient seen and examined by Dr. Angelena Form today and was stable for discharge. All follow up has been arranged.  _____________  Discharge Vitals Blood pressure 131/68, pulse 63, temperature 98 F (36.7 C), temperature source Oral, resp. rate 16, height 6\' 1"  (1.854 m), weight 178 lb 6.4 oz (80.9 kg), SpO2 100 %.  Filed Weights   06/10/17 1144 06/11/17 1440 06/12/17 0412  Weight: 183 lb 2 oz (83.1 kg) 177 lb 6.4 oz (80.5 kg) 178 lb 6.4 oz (80.9 kg)    Labs & Radiologic Studies    CBC Recent Labs    06/10/17 1309 06/11/17 0630 06/12/17 0336  WBC 6.3 4.8 3.9*  NEUTROABS 4.6  --   --   HGB 12.6* 11.1* 10.5*  HCT 37.7* 34.0* 33.4*  MCV 80.6 80.6 79.1  PLT 121* 104* 578*   Basic Metabolic Panel Recent Labs    06/11/17 0630 06/12/17 0933  NA 139 141  K 3.5 4.0  CL 112* 114*  CO2 19* 23  GLUCOSE 116* 168*  BUN 43* 24*  CREATININE 2.58* 1.81*  CALCIUM 9.0 8.9   Liver Function Tests Recent Labs    06/10/17 1309 06/11/17 0630  AST 25 23  ALT 18 16*  ALKPHOS 69 59  BILITOT 1.5* 1.1  PROT 8.1 7.3  ALBUMIN 4.2 3.6   No results for input(s): LIPASE, AMYLASE in the last 72 hours. Cardiac Enzymes Recent Labs    06/10/17 1927 06/11/17 0020 06/11/17 0630  TROPONINI 0.26* 0.26* 0.27*   BNP Invalid input(s): POCBNP D-Dimer No results for input(s): DDIMER in the last 72 hours. Hemoglobin A1C No results for input(s): HGBA1C in the last 72 hours. Fasting Lipid Panel No results for input(s): CHOL, HDL, LDLCALC, TRIG, CHOLHDL, LDLDIRECT in the last 72 hours. Thyroid Function Tests Recent Labs    06/10/17 1927  TSH 0.831   _____________  Ct Abdomen Pelvis Wo Contrast  Result Date: 06/10/2017 CLINICAL DATA:  Abdominal pain and diarrhea this morning. EXAM: CT ABDOMEN AND PELVIS WITHOUT CONTRAST TECHNIQUE: Multidetector CT imaging of the abdomen and pelvis was performed following the standard protocol  without IV contrast. COMPARISON:  09/05/2016 FINDINGS: Lower chest: Stable cardiomegaly with right atrial pacer and right ventricular defibrillator leads in place. Coronary arteriosclerosis is identified. No pericardial effusion. No active pulmonary disease. Hepatobiliary: Status post cholecystectomy. The unenhanced liver demonstrates no biliary ductal dilatation. Slightly nodular liver contour re- demonstrated suspicious for morphologic change of cirrhosis. Pancreas: No acute appearing abnormality involving the unenhanced pancreas. No ductal dilatation or definite mass. Spleen: No splenomegaly or mass. Adrenals/Urinary Tract: Normal bilateral adrenal glands. Re- demonstration of a nonobstructing right lower pole 3 mm calculus. No hydronephrosis nor hydroureter. The urinary bladder is physiologically distended. Stomach/Bowel: Decompressed stomach. Normal small bowel rotation without obstruction or inflammation. No acute bowel inflammation. A small amount of liquid stool along the descending colon may reflect diarrheal disease. Vascular/Lymphatic: Aorto bi-iliac and branch  vessel atherosclerosis without aneurysm. No retroperitoneal or mesenteric lymphadenopathy. Reproductive: The prostate gland and seminal vesicles are unremarkable. Other: No free air. Tiny fat containing umbilical hernia. No abdominopelvic ascites. Musculoskeletal: Degenerative disc disease L5-S1 with minimal grade 1 anterolisthesis. No acute nor suspicious osseous abnormalities. IMPRESSION: 1. No acute bowel obstruction or inflammation. Liquid stool in the descending colon likely representing diarrheal disease. 2. Stable cardiomegaly. 3. Morphologic appearance of the liver suggest cirrhosis as before. 4. Aortoiliac and branch vessel atherosclerosis.  No aneurysm. 5. 3 mm lower pole right renal calculus without obstruction. Electronically Signed   By: Ashley Royalty M.D.   On: 06/10/2017 14:56   Dg Chest 2 View  Result Date: 06/10/2017 CLINICAL  DATA:  Abdominal pain and diarrhea. Syncopal episode today. EXAM: CHEST  2 VIEW COMPARISON:  09/04/2016 FINDINGS: Cardiac pacemaker. Shallow inspiration. Cardiac enlargement without vascular congestion. No edema or consolidation. No blunting of costophrenic angles. No pneumothorax. Linear scarring in the left mid lung is unchanged. Coronary artery stents. Calcification of the aorta. IMPRESSION: Cardiac enlargement. No evidence of vascular congestion, edema, or consolidation in the lungs. Aortic atherosclerosis. Electronically Signed   By: Lucienne Capers M.D.   On: 06/10/2017 18:27   Ct Head Wo Contrast  Result Date: 06/10/2017 CLINICAL DATA:  67 year old male with a history of abdominal pain and syncope EXAM: CT HEAD WITHOUT CONTRAST TECHNIQUE: Contiguous axial images were obtained from the base of the skull through the vertex without intravenous contrast. COMPARISON:  07/17/2016, 11/28/2012 FINDINGS: Brain: No acute intracranial hemorrhage. No midline shift or mass effect. Gray-white differentiation maintained. Confluent hypodensity in the periventricular white matter, similar to the prior. Vascular: Calcifications of intracranial vasculature. Skull: No acute fracture.  No aggressive bony lesion. Sinuses/Orbits: Unremarkable appearance of the orbits. No paranasal sinus disease. No mastoid effusion. Other: None IMPRESSION: Head CT negative for acute abnormality. Evidence of chronic microvascular ischemic disease. Electronically Signed   By: Corrie Mckusick D.O.   On: 06/10/2017 14:48     Diagnostic Studies/Procedures    Device interrogation 06/11/17: No events recorded, normal function, adequate battery life   Echo 06/11/17: Study Conclusions  - Left ventricle: The cavity size was normal. There was severe   concentric hypertrophy. Systolic function was vigorous. The   estimated ejection fraction was in the range of 65% to 70%. Wall   motion was normal; there were no regional wall motion    abnormalities. Features are consistent with a pseudonormal left   ventricular filling pattern, with concomitant abnormal relaxation   and increased filling pressure (grade 2 diastolic dysfunction).   Doppler parameters are consistent with elevated ventricular   end-diastolic filling pressure. - Mitral valve: Calcified annulus. - Left atrium: The atrium was severely dilated. - Right atrium: The atrium was mildly dilated.  Impressions: - Hyperdynamic LVEF, severe left ventricular hypertrophy including   apical segments, additional imaging with cardiac MRI to evaluate   for a possible hypertrophic vs infiltrative cardiomyopathy is   recommended. This can be done as outpatient once his kidney   function recovers.   ECHO: 07/19/2016 - Left ventricle: The cavity size was normal. There was severe concentric hypertrophy. Systolic function was vigorous. The estimated ejection fraction was in the range of 65% to 70%. Wall motion was normal; there were no regional wall motion abnormalities. Features are consistent with a pseudonormal left ventricular filling pattern, with concomitant abnormal relaxation and increased filling pressure (grade 2 diastolic dysfunction). Doppler parameters are consistent with elevated ventricular end-diastolic filling pressure. - Mitral  valve: There was mild regurgitation. - Left atrium: The atrium was severely dilated. - Right ventricle: Systolic function was normal. - Right atrium: The atrium was mildly dilated.  Impressions: - There is severe left ventricular hypertrophy predominantly of the mid and apical segments highly suspicious of apical form of hypertrophic cardiomyopathy. Intracavitary gradient is 40 mmHg. A cardiac MRI is recommended for further evaluation.    Disposition   Pt is being discharged home today in good condition.  Follow-up Plans & Appointments    Instructed to follow up with [facility] cardiologist Discharge  Instructions    Diet - low sodium heart healthy   Complete by:  As directed    Increase activity slowly   Complete by:  As directed       Discharge Medications   Allergies as of 06/12/2017      Reactions   Grapefruit Extract Other (See Comments)   [facility] doctor told him not to eat   Other Other (See Comments)   Reaction to  Unknown anesthesia at [facility] hospital January 2018 - trouble waking up   Amoxicillin Other (See Comments)   Unknown reaction - can tolerate Cephalosporins Has patient had a PCN reaction causing immediate rash, facial/tongue/throat swelling, SOB or lightheadedness with hypotension: unknown Has patient had a PCN reaction causing severe rash involving mucus membranes or skin necrosis: unknown Has patient had a PCN reaction that required hospitalization: unknown Has patient had a PCN reaction occurring within the last 10 years: unknown If all of the above answers are "NO", then may proceed with Cephalosporin      Medication List    STOP taking these medications   lisinopril 20 MG tablet Commonly known as:  PRINIVIL,ZESTRIL     TAKE these medications   acetaminophen 325 MG tablet Commonly known as:  TYLENOL Take 650 mg by mouth every 6 (six) hours as needed for moderate pain.   amLODipine 10 MG tablet Commonly known as:  NORVASC Take 10 mg by mouth daily.   atorvastatin 20 MG tablet Commonly known as:  LIPITOR Take 10 mg by mouth at bedtime.   carvedilol 25 MG tablet Commonly known as:  COREG Take 25 mg by mouth 2 (two) times daily with a meal.   cholecalciferol 1000 units tablet Commonly known as:  VITAMIN D Take 2,000 Units by mouth daily.   clopidogrel 75 MG tablet Commonly known as:  PLAVIX Take 1 tablet (75 mg total) by mouth daily.   ELIQUIS 5 MG Tabs tablet Generic drug:  apixaban Take 5 mg by mouth 2 (two) times daily.   ferrous sulfate 324 (65 Fe) MG Tbec Take 324 mg by mouth daily.   furosemide 20 MG tablet Commonly known as:   LASIX Take 20 mg by mouth daily.   gabapentin 300 MG capsule Commonly known as:  NEURONTIN Take 1 capsule (300 mg total) by mouth at bedtime. What changed:    how much to take  when to take this   insulin glargine 100 UNIT/ML injection Commonly known as:  LANTUS Inject 0.3 mLs (30 Units total) into the skin at bedtime. What changed:  how much to take   ketoconazole 2 % cream Commonly known as:  NIZORAL Apply 1 application topically at bedtime. Apply to feet and between toes   levothyroxine 88 MCG tablet Commonly known as:  SYNTHROID, LEVOTHROID Take 88 mcg by mouth daily.   MINERIN Crea Apply 1 application topically 2 (two) times daily as needed (itching/dry skin).   multivitamin with  minerals Tabs tablet Take 1 tablet by mouth daily.   terazosin 2 MG capsule Commonly known as:  HYTRIN Take 2 mg by mouth at bedtime.         Outstanding Labs/Studies   Recommend cardiac MRI to assess hypertrophic vs infiltrative cardiomyopathy.   BMP in 1 week and resume lisinopril  Duration of Discharge Encounter   Greater than 30 minutes including physician time.  Signed, Tami Lin Santhiago Collingsworth PA-C 06/12/2017, 12:37 PM

## 2017-06-12 DIAGNOSIS — N179 Acute kidney failure, unspecified: Secondary | ICD-10-CM

## 2017-06-12 LAB — BASIC METABOLIC PANEL
ANION GAP: 4 — AB (ref 5–15)
BUN: 24 mg/dL — ABNORMAL HIGH (ref 6–20)
CALCIUM: 8.9 mg/dL (ref 8.9–10.3)
CO2: 23 mmol/L (ref 22–32)
CREATININE: 1.81 mg/dL — AB (ref 0.61–1.24)
Chloride: 114 mmol/L — ABNORMAL HIGH (ref 101–111)
GFR, EST AFRICAN AMERICAN: 43 mL/min — AB (ref >60)
GFR, EST NON AFRICAN AMERICAN: 37 mL/min — AB (ref >60)
Glucose, Bld: 168 mg/dL — ABNORMAL HIGH (ref 65–99)
Potassium: 4 mmol/L (ref 3.5–5.1)
SODIUM: 141 mmol/L (ref 135–145)

## 2017-06-12 LAB — CBC
HCT: 33.4 % — ABNORMAL LOW (ref 39.0–52.0)
Hemoglobin: 10.5 g/dL — ABNORMAL LOW (ref 13.0–17.0)
MCH: 24.9 pg — ABNORMAL LOW (ref 26.0–34.0)
MCHC: 31.4 g/dL (ref 30.0–36.0)
MCV: 79.1 fL (ref 78.0–100.0)
Platelets: 109 10*3/uL — ABNORMAL LOW (ref 150–400)
RBC: 4.22 MIL/uL (ref 4.22–5.81)
RDW: 15.8 % — ABNORMAL HIGH (ref 11.5–15.5)
WBC: 3.9 10*3/uL — ABNORMAL LOW (ref 4.0–10.5)

## 2017-06-12 LAB — HEPARIN LEVEL (UNFRACTIONATED): Heparin Unfractionated: 0.94 IU/mL — ABNORMAL HIGH (ref 0.30–0.70)

## 2017-06-12 LAB — APTT: aPTT: 86 seconds — ABNORMAL HIGH (ref 24–36)

## 2017-06-12 LAB — GLUCOSE, CAPILLARY
Glucose-Capillary: 137 mg/dL — ABNORMAL HIGH (ref 65–99)
Glucose-Capillary: 121 mg/dL — ABNORMAL HIGH (ref 65–99)

## 2017-06-12 NOTE — Progress Notes (Signed)
ANTICOAGULATION CONSULT NOTE - Initial Consult  Pharmacy Consult for Heparin Indication: chest pain/ACS  Patient Measurements: Height: 6\' 1"  (185.4 cm) Weight: 178 lb 6.4 oz (80.9 kg) IBW/kg (Calculated) : 79.9 Heparin Dosing Weight: 83.1 kg  Vital Signs: Temp: 98 F (36.7 C) (01/03 0412) Temp Source: Oral (01/03 0412) BP: 131/68 (01/03 0412) Pulse Rate: 63 (01/03 0412)  Labs: Recent Labs    06/10/17 1309 06/10/17 1927 06/11/17 0020 06/11/17 0024 06/11/17 0630 06/12/17 0336  HGB 12.6*  --   --   --  11.1* 10.5*  HCT 37.7*  --   --   --  34.0* 33.4*  PLT 121*  --   --   --  104* 109*  APTT  --   --   --  69* 68* 86*  HEPARINUNFRC  --   --   --   --   --  0.94*  CREATININE 2.86*  --   --   --  2.58*  --   TROPONINI  --  0.26* 0.26*  --  0.27*  --     Estimated Creatinine Clearance: 31.4 mL/min (A) (by C-G formula based on SCr of 2.58 mg/dL (H)).   Assessment: 47 yoM presents s/p syncope and fall with history of aflutter on apixaban PTA. Last dose of Eliquis 1/1 @ ~0800-0900. Pharmacy consulted to dose heparin for r/o ACS/STEMI. Hgb 10.5, pltc 109. No bleeding noted. Will dose by aPTT until aPTT and heparin levels correlate. Heparin level remains elevated this morning at 0.94, aPTT is therapeutic at 86.  Goal of Therapy:  Heparin level 0.3-0.7 units/ml aPTT 66-102 Monitor platelets by anticoagulation protocol: Yes   Plan:  No heparin bolus Continue heparin infusion at 1000 units/hr Daily aPTT/heparin level, CBC Monitor H&H and platelets, s/sx of bleeding F/u resume apixaban   Thank you for allowing [place] to participate in this patients care.  Jens Som, PharmD Clinical phone for 06/12/2017 from 7a-3:30p: [phone] If after 3:30p, please call main pharmacy at: [phone] 06/12/2017 10:18 AM

## 2017-06-12 NOTE — Plan of Care (Signed)
Pt completed all areas of care plan

## 2017-06-12 NOTE — Progress Notes (Signed)
Progress Note  Patient Name: Anthony Mcfarland Date of Encounter: 06/12/2017  [facility]ologist: [facility]  Subjective   No chest pain or dyspnea.   Inpatient Medications    Scheduled Meds: . amLODipine  10 mg Oral Daily  . atorvastatin  10 mg Oral QHS  . carvedilol  25 mg Oral BID WC  . cholecalciferol  2,000 Units Oral Daily  . ferrous sulfate  325 mg Oral Q breakfast  . insulin aspart  0-15 Units Subcutaneous TID WC  . insulin glargine  20 Units Subcutaneous QHS  . ketoconazole  1 application Topical QHS  . levothyroxine  88 mcg Oral QAC breakfast  . terazosin  2 mg Oral QHS   Continuous Infusions: . sodium chloride 50 mL/hr at 06/11/17 1719  . heparin 1,000 Units/hr (06/10/17 1841)   PRN Meds: acetaminophen, ALPRAZolam, nitroGLYCERIN, ondansetron (ZOFRAN) IV, zolpidem   Vital Signs    Vitals:   06/11/17 1440 06/11/17 1724 06/11/17 1947 06/12/17 0412  BP: 121/90 123/81 122/72 131/68  Pulse: (!) 58  60 63  Resp: 16  17 16   Temp: (!) 97.5 F (36.4 C)  98.1 F (36.7 C) 98 F (36.7 C)  TempSrc: Oral  Oral Oral  SpO2: 100%  100% 100%  Weight: 177 lb 6.4 oz (80.5 kg)   178 lb 6.4 oz (80.9 kg)  Height:        Intake/Output Summary (Last 24 hours) at 06/12/2017 0833 Last data filed at 06/12/2017 0438 Gross per 24 hour  Intake 1140 ml  Output 550 ml  Net 590 ml   Filed Weights   06/10/17 1144 06/11/17 1440 06/12/17 0412  Weight: 183 lb 2 oz (83.1 kg) 177 lb 6.4 oz (80.5 kg) 178 lb 6.4 oz (80.9 kg)     Physical Exam    General: Well developed, well nourished, NAD  HEENT: OP clear, mucus membranes moist  SKIN: warm, dry. No rashes. Neuro: No focal deficits  Musculoskeletal: Muscle strength 5/5 all ext  Psychiatric: Mood and affect normal  Neck: No JVD, no carotid bruits, no thyromegaly, no lymphadenopathy.  Lungs:Clear bilaterally, no wheezes, rhonci, crackles Cardiovascular: Regular rate and rhythm. No murmurs, gallops or rubs. Abdomen:Soft. Bowel sounds  present. Non-tender.  Extremities: No lower extremity edema. Pulses are 2 + in the bilateral DP/PT.   Labs    Chemistry Recent Labs  Lab 06/10/17 1309 06/11/17 0630  NA 136 139  K 3.7 3.5  CL 107 112*  CO2 19* 19*  GLUCOSE 207* 116*  BUN 44* 43*  CREATININE 2.86* 2.58*  CALCIUM 9.4 9.0  PROT 8.1 7.3  ALBUMIN 4.2 3.6  AST 25 23  ALT 18 16*  ALKPHOS 69 59  BILITOT 1.5* 1.1  GFRNONAA 21* 24*  GFRAA 25* 28*  ANIONGAP 10 8     Hematology Recent Labs  Lab 06/10/17 1309 06/11/17 0630 06/12/17 0336  WBC 6.3 4.8 3.9*  RBC 4.68 4.22 4.22  HGB 12.6* 11.1* 10.5*  HCT 37.7* 34.0* 33.4*  MCV 80.6 80.6 79.1  MCH 26.9 26.3 24.9*  MCHC 33.4 32.6 31.4  RDW 16.2* 16.2* 15.8*  PLT 121* 104* 109*    Cardiac Enzymes Recent Labs  Lab 06/10/17 1927 06/11/17 0020 06/11/17 0630  TROPONINI 0.26* 0.26* 0.27*    Recent Labs  Lab 06/10/17 1339  TROPIPOC 0.14*     BNPNo results for input(s): BNP, PROBNP in the last 168 hours.   DDimer No results for input(s): DDIMER in the last 168 hours.  Radiology    Ct Abdomen Pelvis Wo Contrast  Result Date: 06/10/2017 CLINICAL DATA:  Abdominal pain and diarrhea this morning. EXAM: CT ABDOMEN AND PELVIS WITHOUT CONTRAST TECHNIQUE: Multidetector CT imaging of the abdomen and pelvis was performed following the standard protocol without IV contrast. COMPARISON:  09/05/2016 FINDINGS: Lower chest: Stable cardiomegaly with right atrial pacer and right ventricular defibrillator leads in place. Coronary arteriosclerosis is identified. No pericardial effusion. No active pulmonary disease. Hepatobiliary: Status post cholecystectomy. The unenhanced liver demonstrates no biliary ductal dilatation. Slightly nodular liver contour re- demonstrated suspicious for morphologic change of cirrhosis. Pancreas: No acute appearing abnormality involving the unenhanced pancreas. No ductal dilatation or definite mass. Spleen: No splenomegaly or mass. Adrenals/Urinary  Tract: Normal bilateral adrenal glands. Re- demonstration of a nonobstructing right lower pole 3 mm calculus. No hydronephrosis nor hydroureter. The urinary bladder is physiologically distended. Stomach/Bowel: Decompressed stomach. Normal small bowel rotation without obstruction or inflammation. No acute bowel inflammation. A small amount of liquid stool along the descending colon may reflect diarrheal disease. Vascular/Lymphatic: Aorto bi-iliac and branch vessel atherosclerosis without aneurysm. No retroperitoneal or mesenteric lymphadenopathy. Reproductive: The prostate gland and seminal vesicles are unremarkable. Other: No free air. Tiny fat containing umbilical hernia. No abdominopelvic ascites. Musculoskeletal: Degenerative disc disease L5-S1 with minimal grade 1 anterolisthesis. No acute nor suspicious osseous abnormalities. IMPRESSION: 1. No acute bowel obstruction or inflammation. Liquid stool in the descending colon likely representing diarrheal disease. 2. Stable cardiomegaly. 3. Morphologic appearance of the liver suggest cirrhosis as before. 4. Aortoiliac and branch vessel atherosclerosis.  No aneurysm. 5. 3 mm lower pole right renal calculus without obstruction. Electronically Signed   By: Ashley Royalty M.D.   On: 06/10/2017 14:56   Dg Chest 2 View  Result Date: 06/10/2017 CLINICAL DATA:  Abdominal pain and diarrhea. Syncopal episode today. EXAM: CHEST  2 VIEW COMPARISON:  09/04/2016 FINDINGS: Cardiac pacemaker. Shallow inspiration. Cardiac enlargement without vascular congestion. No edema or consolidation. No blunting of costophrenic angles. No pneumothorax. Linear scarring in the left mid lung is unchanged. Coronary artery stents. Calcification of the aorta. IMPRESSION: Cardiac enlargement. No evidence of vascular congestion, edema, or consolidation in the lungs. Aortic atherosclerosis. Electronically Signed   By: Lucienne Capers M.D.   On: 06/10/2017 18:27   Ct Head Wo Contrast  Result Date:  06/10/2017 CLINICAL DATA:  67 year old male with a history of abdominal pain and syncope EXAM: CT HEAD WITHOUT CONTRAST TECHNIQUE: Contiguous axial images were obtained from the base of the skull through the vertex without intravenous contrast. COMPARISON:  07/17/2016, 11/28/2012 FINDINGS: Brain: No acute intracranial hemorrhage. No midline shift or mass effect. Gray-white differentiation maintained. Confluent hypodensity in the periventricular white matter, similar to the prior. Vascular: Calcifications of intracranial vasculature. Skull: No acute fracture.  No aggressive bony lesion. Sinuses/Orbits: Unremarkable appearance of the orbits. No paranasal sinus disease. No mastoid effusion. Other: None IMPRESSION: Head CT negative for acute abnormality. Evidence of chronic microvascular ischemic disease. Electronically Signed   By: Corrie Mckusick D.O.   On: 06/10/2017 14:48     Telemetry    sinus - Personally Reviewed  ECG    No new tracings - Personally Reviewed  Cardiac Studies   Echo 06/11/16: - Left ventricle: The cavity size was normal. There was severe   concentric hypertrophy. Systolic function was vigorous. The   estimated ejection fraction was in the range of 65% to 70%. Wall   motion was normal; there were no regional wall motion   abnormalities. Features  are consistent with a pseudonormal left   ventricular filling pattern, with concomitant abnormal relaxation   and increased filling pressure (grade 2 diastolic dysfunction).   Doppler parameters are consistent with elevated ventricular   end-diastolic filling pressure. - Mitral valve: Calcified annulus. - Left atrium: The atrium was severely dilated. - Right atrium: The atrium was mildly dilated.  ICD interrogation:  No events recorded  Patient Profile     67 y.o. male with a history of HOCM, VT s/p MDT ICD, A flutter s/p ablation x 2, non-obs CAD cath 2008, pt reports a later cardiac stent, heme+ stools when on Plavix and  Eliquis, syncope prior to ICD  Assessment & Plan    1. Syncope, ICD in place: ICD interrogated and no events noted. Syncope felt to be vasovagal following night of cocaine use with possible dehydration (see acute renal failure). UDS positive for cocaine. Echo with HOCM which is known. NO wall motion abnormalities.   2. Troponin elevation: Felt to be demand ischemia in setting of cocaine use and HOCM. He has no chest pain. No EKG changes. He does have a history of coronary stenting but is not on ASA or Plavix due to bleeding in the past. He has been on Eliquis at home given history of atrial flutter. Eliquis has been held due to his elevated troponin as need for possible cath not known at time of admission. Continue beta blocker and statin.   3. HOCM: Chronic. He was hydrated over last 24 hours.    4. Acute renal insufficiency: BMET pending this am. If creatinine continues to improve, can be discharged home today. Lasix and Lisinopril on hold. Hopefully creatinine will return to baseline as his Eliquis will have to be dosed accordingly.   5. Hx of Aflutter: Sinus today. He is on a heparin drip while holding Eliquis. Will dose Eliquis based on renal function.    Jeanice Lim  8:33 AM 06/12/2017

## 2019-07-22 ENCOUNTER — Ambulatory Visit: Payer: Medicare Other | Attending: Family

## 2019-07-22 DIAGNOSIS — Z23 Encounter for immunization: Secondary | ICD-10-CM

## 2019-07-22 NOTE — Progress Notes (Signed)
   Covid-19 Vaccination Clinic  Name:  Anthony Mcfarland    MRN: [id number] DOB: 1950/09/16  07/22/2019  Mr. Jaurequi was observed post Covid-19 immunization for 30 minutes based on pre-vaccination screening without incidence. He was provided with Vaccine Information Sheet and instruction to access the V-Safe system.   Mr. Kuyper was instructed to call 911 with any severe reactions post vaccine: Marland Kitchen Difficulty breathing  . Swelling of your face and throat  . A fast heartbeat  . A bad rash all over your body  . Dizziness and weakness    Immunizations Administered    Name Date Dose VIS Date Route   Moderna COVID-19 Vaccine 07/22/2019  4:19 PM 0.5 mL 05/11/2019 Intramuscular   Manufacturer: Moderna   Lot: [id number]   [id number]

## 2019-08-24 ENCOUNTER — Ambulatory Visit: Payer: Medicare Other | Attending: Family

## 2019-08-24 DIAGNOSIS — Z23 Encounter for immunization: Secondary | ICD-10-CM

## 2019-08-24 NOTE — Progress Notes (Signed)
   Covid-19 Vaccination Clinic  Name:  Anthony Mcfarland    MRN: [id number] DOB: 1950-06-12  08/24/2019  Anthony Mcfarland was observed post Covid-19 immunization for 30 minutes based on pre-vaccination screening without incident. He was provided with Vaccine Information Sheet and instruction to access the V-Safe system.   Anthony Mcfarland was instructed to call 911 with any severe reactions post vaccine: Marland Kitchen Difficulty breathing  . Swelling of face and throat  . A fast heartbeat  . A bad rash all over body  . Dizziness and weakness   Immunizations Administered    Name Date Dose VIS Date Route   Moderna COVID-19 Vaccine 08/24/2019 12:45 PM 0.5 mL 05/11/2019 Intramuscular   Manufacturer: Moderna   Lot: [id number]   [id number]

## 2020-03-09 ENCOUNTER — Ambulatory Visit: Payer: Medicare Other | Attending: Family

## 2020-03-09 DIAGNOSIS — Z23 Encounter for immunization: Secondary | ICD-10-CM

## 2021-09-19 ENCOUNTER — Encounter: Payer: Self-pay | Admitting: Gastroenterology
